# Patient Record
Sex: Female | Born: 1945 | Race: White | Hispanic: No | Marital: Married | Smoking: Former smoker
Health system: Southern US, Community
[De-identification: ages and names within clinical notes are randomized; demographics above are authoritative.]

## PROBLEM LIST (undated history)

## (undated) DIAGNOSIS — I10 Essential (primary) hypertension: Secondary | ICD-10-CM

## (undated) DIAGNOSIS — R519 Headache, unspecified: Secondary | ICD-10-CM

## (undated) DIAGNOSIS — I499 Cardiac arrhythmia, unspecified: Secondary | ICD-10-CM

## (undated) DIAGNOSIS — R112 Nausea with vomiting, unspecified: Secondary | ICD-10-CM

## (undated) DIAGNOSIS — D649 Anemia, unspecified: Secondary | ICD-10-CM

## (undated) DIAGNOSIS — Z9889 Other specified postprocedural states: Secondary | ICD-10-CM

## (undated) DIAGNOSIS — K56609 Unspecified intestinal obstruction, unspecified as to partial versus complete obstruction: Secondary | ICD-10-CM

## (undated) DIAGNOSIS — I82409 Acute embolism and thrombosis of unspecified deep veins of unspecified lower extremity: Secondary | ICD-10-CM

## (undated) DIAGNOSIS — M1990 Unspecified osteoarthritis, unspecified site: Secondary | ICD-10-CM

## (undated) DIAGNOSIS — Z8673 Personal history of transient ischemic attack (TIA), and cerebral infarction without residual deficits: Secondary | ICD-10-CM

## (undated) DIAGNOSIS — K5732 Diverticulitis of large intestine without perforation or abscess without bleeding: Secondary | ICD-10-CM

## (undated) DIAGNOSIS — K219 Gastro-esophageal reflux disease without esophagitis: Secondary | ICD-10-CM

## (undated) DIAGNOSIS — R51 Headache: Secondary | ICD-10-CM

## (undated) DIAGNOSIS — Z87440 Personal history of urinary (tract) infections: Secondary | ICD-10-CM

## (undated) DIAGNOSIS — E0590 Thyrotoxicosis, unspecified without thyrotoxic crisis or storm: Secondary | ICD-10-CM

## (undated) DIAGNOSIS — F419 Anxiety disorder, unspecified: Secondary | ICD-10-CM

## (undated) DIAGNOSIS — I509 Heart failure, unspecified: Secondary | ICD-10-CM

## (undated) DIAGNOSIS — D591 Other autoimmune hemolytic anemias: Secondary | ICD-10-CM

## (undated) DIAGNOSIS — E871 Hypo-osmolality and hyponatremia: Secondary | ICD-10-CM

## (undated) DIAGNOSIS — R011 Cardiac murmur, unspecified: Secondary | ICD-10-CM

## (undated) DIAGNOSIS — K659 Peritonitis, unspecified: Secondary | ICD-10-CM

## (undated) HISTORY — PX: OTHER SURGICAL HISTORY: SHX169

## (undated) HISTORY — PX: COLON SURGERY: SHX602

---

## 1964-07-05 HISTORY — PX: OVARIAN CYST REMOVAL: SHX89

## 1964-07-05 HISTORY — PX: APPENDECTOMY: SHX54

## 1970-07-05 HISTORY — PX: OTHER SURGICAL HISTORY: SHX169

## 1975-07-06 DIAGNOSIS — K659 Peritonitis, unspecified: Secondary | ICD-10-CM

## 1975-07-06 HISTORY — DX: Peritonitis, unspecified: K65.9

## 1992-07-05 DIAGNOSIS — K5732 Diverticulitis of large intestine without perforation or abscess without bleeding: Secondary | ICD-10-CM

## 1992-07-05 HISTORY — DX: Diverticulitis of large intestine without perforation or abscess without bleeding: K57.32

## 2005-02-06 ENCOUNTER — Emergency Department (HOSPITAL_COMMUNITY): Admission: EM | Admit: 2005-02-06 | Discharge: 2005-02-06 | Payer: Self-pay | Admitting: Emergency Medicine

## 2005-03-03 ENCOUNTER — Other Ambulatory Visit: Admission: RE | Admit: 2005-03-03 | Discharge: 2005-03-03 | Payer: Self-pay | Admitting: Obstetrics and Gynecology

## 2009-07-05 HISTORY — PX: LEG SURGERY: SHX1003

## 2013-07-05 HISTORY — PX: VITRECTOMY: SHX106

## 2014-07-05 HISTORY — PX: EYE SURGERY: SHX253

## 2014-08-20 ENCOUNTER — Emergency Department (HOSPITAL_COMMUNITY): Payer: Medicare Other

## 2014-08-20 ENCOUNTER — Emergency Department (HOSPITAL_COMMUNITY)
Admission: EM | Admit: 2014-08-20 | Discharge: 2014-08-21 | Disposition: A | Discharge status: Home / Self Care | Payer: Medicare Other | Attending: Emergency Medicine | Admitting: Emergency Medicine

## 2014-08-20 ENCOUNTER — Encounter (HOSPITAL_COMMUNITY): Payer: Self-pay | Admitting: Adult Health

## 2014-08-20 DIAGNOSIS — I639 Cerebral infarction, unspecified: Secondary | ICD-10-CM

## 2014-08-20 DIAGNOSIS — E871 Hypo-osmolality and hyponatremia: Secondary | ICD-10-CM

## 2014-08-20 DIAGNOSIS — R519 Headache, unspecified: Secondary | ICD-10-CM

## 2014-08-20 DIAGNOSIS — R51 Headache: Secondary | ICD-10-CM | POA: Insufficient documentation

## 2014-08-20 DIAGNOSIS — I10 Essential (primary) hypertension: Secondary | ICD-10-CM | POA: Diagnosis not present

## 2014-08-20 DIAGNOSIS — H532 Diplopia: Secondary | ICD-10-CM | POA: Diagnosis not present

## 2014-08-20 DIAGNOSIS — D6489 Other specified anemias: Secondary | ICD-10-CM

## 2014-08-20 DIAGNOSIS — J018 Other acute sinusitis: Secondary | ICD-10-CM

## 2014-08-20 HISTORY — DX: Essential (primary) hypertension: I10

## 2014-08-20 LAB — COMPREHENSIVE METABOLIC PANEL
ALBUMIN: 3.8 g/dL (ref 3.5–5.2)
ALK PHOS: 91 U/L (ref 39–117)
ALT: 10 U/L (ref 0–35)
AST: 17 U/L (ref 0–37)
Anion gap: 9 (ref 5–15)
BUN: 15 mg/dL (ref 6–23)
CHLORIDE: 99 mmol/L (ref 96–112)
CO2: 22 mmol/L (ref 19–32)
Calcium: 8.9 mg/dL (ref 8.4–10.5)
Creatinine, Ser: 0.85 mg/dL (ref 0.50–1.10)
GFR calc Af Amer: 79 mL/min — ABNORMAL LOW (ref >90)
GFR calc non Af Amer: 68 mL/min — ABNORMAL LOW (ref >90)
Glucose, Bld: 111 mg/dL — ABNORMAL HIGH (ref 70–99)
POTASSIUM: 4.4 mmol/L (ref 3.5–5.1)
Sodium: 130 mmol/L — ABNORMAL LOW (ref 135–145)
TOTAL PROTEIN: 7.2 g/dL (ref 6.0–8.3)
Total Bilirubin: 1.7 mg/dL — ABNORMAL HIGH (ref 0.3–1.2)

## 2014-08-20 LAB — DIFFERENTIAL
BASOS PCT: 1 % (ref 0–1)
Basophils Absolute: 0.1 10*3/uL (ref 0.0–0.1)
EOS PCT: 1 % (ref 0–5)
Eosinophils Absolute: 0.1 10*3/uL (ref 0.0–0.7)
Lymphocytes Relative: 15 % (ref 12–46)
Lymphs Abs: 1 10*3/uL (ref 0.7–4.0)
MONOS PCT: 5 % (ref 3–12)
Monocytes Absolute: 0.3 10*3/uL (ref 0.1–1.0)
NEUTROS PCT: 78 % — AB (ref 43–77)
Neutro Abs: 4.9 10*3/uL (ref 1.7–7.7)

## 2014-08-20 LAB — PROTIME-INR
INR: 1.06 (ref 0.00–1.49)
PROTHROMBIN TIME: 13.9 s (ref 11.6–15.2)

## 2014-08-20 LAB — CBC
HCT: 26.8 % — ABNORMAL LOW (ref 36.0–46.0)
Hemoglobin: 9.5 g/dL — ABNORMAL LOW (ref 12.0–15.0)
MCH: 39.9 pg — ABNORMAL HIGH (ref 26.0–34.0)
MCHC: 35.4 g/dL (ref 30.0–36.0)
MCV: 112.6 fL — ABNORMAL HIGH (ref 78.0–100.0)
PLATELETS: 301 10*3/uL (ref 150–400)
RBC: 2.38 MIL/uL — ABNORMAL LOW (ref 3.87–5.11)
RDW: 16.5 % — ABNORMAL HIGH (ref 11.5–15.5)
WBC: 6.4 10*3/uL (ref 4.0–10.5)

## 2014-08-20 LAB — I-STAT TROPONIN, ED: Troponin i, poc: 0 ng/mL (ref 0.00–0.08)

## 2014-08-20 LAB — APTT: aPTT: 31 seconds (ref 24–37)

## 2014-08-20 MED ORDER — LORAZEPAM 1 MG PO TABS
1.0000 mg | ORAL_TABLET | Freq: Once | ORAL | Status: AC
  Administered 2014-08-20: 1 mg via ORAL
  Filled 2014-08-20 (×2): qty 1

## 2014-08-20 NOTE — ED Notes (Signed)
MD at bedside. To explain need for MRI

## 2014-08-20 NOTE — ED Provider Notes (Addendum)
CSN: [id number]     Arrival date & time 08/20/14  1826 History   First MD Initiated Contact with Patient 08/20/14 2008     Chief Complaint  Patient presents with  . Diplopia  . Headache     (Consider location/radiation/quality/duration/timing/severity/associated sxs/prior Treatment) Patient is a 69 y.o. female presenting with headaches. The history is provided by the patient.  Headache Associated symptoms: congestion and sinus pressure   Associated symptoms: no abdominal pain, no back pain, no eye pain, no fever, no nausea, no neck pain, no neck stiffness, no numbness, no sore throat, no vomiting and no weakness   pt c/o onset double vision 2 days ago. States symptoms constant, and only appear present w both eyes open - pt states when either eye is closed, no double vision. No amaurosis or visual field cut. No eye pain. Pt indicates is currently being treated for a 'sinus infection' with doxycycline and cipro.  Pt notes sinus congestion and drainage. No ear pain, tinnitus or change in hearing. No dizziness. Pt has noted gradual onset intermittent frontal headaches, which she attributed to sinus symptoms. No acute or abrupt onset of headache, or severe headache. No neck pain or stiffness. No numbness or weakness. No problems w speech. No change in gait. No loss of normal functional ability.  Pt saw her ophthalmologist today who sent to ED to 'rule out stroke'.     Past Medical History  Diagnosis Date  . Hypertension    Past Surgical History  Procedure Laterality Date  . Eye surgery    . Vitrectomy     History reviewed. No pertinent family history. History  Substance Use Topics  . Smoking status: Never Smoker   . Smokeless tobacco: Not on file  . Alcohol Use: Yes   OB History    No data available     Review of Systems  Constitutional: Negative for fever and chills.  HENT: Positive for congestion, rhinorrhea and sinus pressure. Negative for sore throat and trouble swallowing.    Eyes: Negative for pain, discharge and redness.  Respiratory: Negative for shortness of breath.   Cardiovascular: Negative for chest pain.  Gastrointestinal: Negative for nausea, vomiting and abdominal pain.  Genitourinary: Negative for dysuria and flank pain.  Musculoskeletal: Negative for back pain, neck pain and neck stiffness.  Skin: Negative for rash.  Neurological: Positive for headaches. Negative for weakness and numbness.  Hematological: Does not bruise/bleed easily.  Psychiatric/Behavioral: Negative for confusion.      Allergies  Codeine; Erythromycin; Morphine and related; Penicillins; Percocet; and Percodan  Home Medications   Prior to Admission medications   Not on File   BP 180/70 mmHg  Pulse 63  Temp(Src) 98.2 F (36.8 C) (Oral)  Resp 16  Ht 5' 3.75" (1.619 m)  Wt 150 lb (68.04 kg)  BMI 25.96 kg/m2  SpO2 100% Physical Exam  Constitutional: She is oriented to person, place, and time. She appears well-developed and well-nourished. No distress.  HENT:  Head: Atraumatic.  Nose: Nose normal.  Mouth/Throat: Oropharynx is clear and moist.  No temporal tenderness. +sinus congestion  Eyes: Conjunctivae and EOM are normal. Pupils are equal, round, and reactive to light. No scleral icterus.  Neck: Neck supple. No tracheal deviation present. No thyromegaly present.  No stiffness or rigidity. No bruits.   Cardiovascular: Normal rate, regular rhythm, normal heart sounds and intact distal pulses.  Exam reveals no gallop and no friction rub.   No murmur heard. Pulmonary/Chest: Effort normal and breath sounds  normal. No respiratory distress.  Abdominal: Soft. Normal appearance and bowel sounds are normal. She exhibits no distension. There is no tenderness.  Genitourinary:  No cva tenderness.  Musculoskeletal: Normal range of motion. She exhibits no edema or tenderness.  Neurological: She is alert and oriented to person, place, and time. No cranial nerve deficit.  Motor  intact bilaterally, stre 5/5. sens grossly intact. No pronator drift. Finger to nose intact/normal. Steady gait. No nystagmus. Pt reports seeing two images, one on top of other with both eyes. Full eye exam documentation w pt from Dr Vonna KotykBevis.  Skin: Skin is warm and dry. No rash noted. She is not diaphoretic.  Psychiatric: She has a normal mood and affect.  Nursing note and vitals reviewed.   ED Course  Procedures (including critical care time) Labs Review   Results for orders placed or performed during the hospital encounter of 08/20/14  Protime-INR  Result Value Ref Range   Prothrombin Time 13.9 11.6 - 15.2 seconds   INR 1.06 0.00 - 1.49  APTT  Result Value Ref Range   aPTT 31 24 - 37 seconds  CBC  Result Value Ref Range   WBC 6.4 4.0 - 10.5 K/uL   RBC 2.38 (L) 3.87 - 5.11 MIL/uL   Hemoglobin 9.5 (L) 12.0 - 15.0 g/dL   HCT 13.026.8 (L) 86.536.0 - 78.446.0 %   MCV 112.6 (H) 78.0 - 100.0 fL   MCH 39.9 (H) 26.0 - 34.0 pg   MCHC 35.4 30.0 - 36.0 g/dL   RDW 69.616.5 (H) 29.511.5 - 28.415.5 %   Platelets 301 150 - 400 K/uL  Differential  Result Value Ref Range   Neutrophils Relative % 78 (H) 43 - 77 %   Lymphocytes Relative 15 12 - 46 %   Monocytes Relative 5 3 - 12 %   Eosinophils Relative 1 0 - 5 %   Basophils Relative 1 0 - 1 %   Neutro Abs 4.9 1.7 - 7.7 K/uL   Lymphs Abs 1.0 0.7 - 4.0 K/uL   Monocytes Absolute 0.3 0.1 - 1.0 K/uL   Eosinophils Absolute 0.1 0.0 - 0.7 K/uL   Basophils Absolute 0.1 0.0 - 0.1 K/uL   RBC Morphology POLYCHROMASIA PRESENT   Comprehensive metabolic panel  Result Value Ref Range   Sodium 130 (L) 135 - 145 mmol/L   Potassium 4.4 3.5 - 5.1 mmol/L   Chloride 99 96 - 112 mmol/L   CO2 22 19 - 32 mmol/L   Glucose, Bld 111 (H) 70 - 99 mg/dL   BUN 15 6 - 23 mg/dL   Creatinine, Ser 1.320.85 0.50 - 1.10 mg/dL   Calcium 8.9 8.4 - 44.010.5 mg/dL   Total Protein 7.2 6.0 - 8.3 g/dL   Albumin 3.8 3.5 - 5.2 g/dL   AST 17 0 - 37 U/L   ALT 10 0 - 35 U/L   Alkaline Phosphatase 91 39 - 117  U/L   Total Bilirubin 1.7 (H) 0.3 - 1.2 mg/dL   GFR calc non Af Amer 68 (L) >90 mL/min   GFR calc Af Amer 79 (L) >90 mL/min   Anion gap 9 5 - 15  I[facility]stat troponin, ED [facility])  Result Value Ref Range   Troponin i, poc 0.00 0.00 - 0.08 ng/mL   Comment 3           Ct Head Wo Contrast  08/20/2014   CLINICAL DATA:  Double vision and frontal headaches for 3 days  EXAM: CT  HEAD WITHOUT CONTRAST  TECHNIQUE: Contiguous axial images were obtained from the base of the skull through the vertex without intravenous contrast.  COMPARISON:  None.  FINDINGS: The bony calvarium is intact. The ethmoid and maxillary sinuses demonstrate opacification consistent with chronic sinus disease. No findings to suggest acute hemorrhage, acute infarction or space-occupying mass lesion are noted.  IMPRESSION: Chronic opacification in the ethmoid and maxillary sinuses. This may be related to the patient's current symptomatology.  No acute intracranial abnormality is noted.   Electronically Signed   By: Alcide Clever M.D.   On: 08/20/2014 21:23   Mr Brain Wo Contrast  08/20/2014   CLINICAL DATA:  Initial evaluation for headache and diplopia.  EXAM: MRI HEAD WITHOUT CONTRAST  TECHNIQUE: Multiplanar, multiecho pulse sequences of the brain and surrounding structures were obtained without intravenous contrast.  COMPARISON:  Prior CT from earlier the same day.  FINDINGS: Mild diffuse prominence of the CSF containing spaces is compatible with generalized cerebral atrophy. Patchy T2/FLAIR hyperintensity within the periventricular deep white matter both cerebral hemispheres noted, most compatible with mild to moderate chronic small vessel ischemic disease. Small remote lacunar infarct present within the right cerebellar hemisphere. Probable small additional remote lacunar infarct within the right caudate head.  No abnormal foci of restricted diffusion to suggest acute intracranial infarct. Gray-white matter differentiation maintained.  Normal intravascular flow voids are present. No intracranial hemorrhage.  No mass lesion or midline shift. No hydrocephalus. No extra-axial fluid collection.  Craniocervical junction within normal limits. Mild thickening of the tectorial membrane noted. Pituitary gland normal. No acute abnormality seen about the orbits.  There is essentially complete opacification of the bilateral maxillary sinuses, frontal sinuses, and sphenoid sinuses, worse on the left. Ethmoidal air cells are largely opacified as well. These changes appear largely chronic on prior CT.  Scattered fluid signal intensity present within the inferior left mastoid air cells. Right mastoid air cells clear.  IMPRESSION: 1. No acute intracranial infarct or other abnormality identified. 2. Pansinus disease, better evaluated on prior CT from earlier the same day. This could conceivably be related to patient's current symptomatology. 3. 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 4. 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   Electronically Signed   By: Rise Mu M.D.   On: 08/20/2014 23:49       EKG Interpretation   Date/Time:  Tuesday August 20 2014 18:37:26 EST Ventricular Rate:  62 PR Interval:  162 QRS Duration: 74 QT Interval:  392 QTC Calculation: 397 R Axis:   42 Text Interpretation:  Normal sinus rhythm Normal ECG No previous tracing  Confirmed by Denton Lank  MD, Caryn Bee ([id number]) on 08/20/2014 8:18:16 PM      MDM   Ct. Lab.  Reviewed nursing notes and prior charts for additional history.   Pt sent from ophthalmologists office w request for mr.  Mri read by radiology neg for acute cva.  Note made on ct and mr of sinusitis.   Pt on recheck comfortable. Afeb. States still has abx left to take.  Currently on no antihis-dec except had tried mucinex.  Will give rx zyrtec d.  Discussed close pcp follow up - including for mild hyponatremia and  anemia (no prior labs in system to compare).  Return precautions discussed.       Suzi Roots, MD 08/21/14 [phone]

## 2014-08-20 NOTE — ED Notes (Signed)
Pt transported to MRI 

## 2014-08-20 NOTE — ED Notes (Signed)
Sent from [facility] with c/o verticle diplopia for 2 days began while watching tv. Endorses headache and double vision at this time. Nothing makes vision better, nothing makes it worse. Headache and double vision comes and goes-this episode began at 2 pm today. Eye doctor sent her here to rule out a CVA. No drift, droop or numbness or tingling.

## 2014-08-20 NOTE — ED Notes (Signed)
Pt still in MRI at this time

## 2014-08-20 NOTE — ED Notes (Signed)
CT made aware pt will have CT not MRI per dr. Denton Lanksteinl

## 2014-08-20 NOTE — ED Notes (Signed)
MD at bedside. 

## 2014-08-21 MED ORDER — CETIRIZINE-PSEUDOEPHEDRINE ER 5-120 MG PO TB12: 1.0000 | ORAL_TABLET | Freq: Two times a day (BID) | ORAL | Status: DC | PRN

## 2014-08-21 NOTE — Discharge Instructions (Signed)
It was our pleasure to provide your ER care today - we hope that you feel better.  Rest. Drink adequate fluids, and eat balanced diet.   Complete the course of your antibiotic.  Take zyrtec d as need for sinus congestion.  Follow up with your doctor in the next 1-2 days for recheck.  From today's lab tests, your sodium level is mildly low (130), and your blood count low (hemoglobin 9.5) - discuss these results with your doctor, and have them recheck labs, and follow up for this.  Return to ER if worse, new symptoms, severe headache, persistent vomiting, severe diarrhea, weak/faint, change in vision, numbness/weakness, other concern.      Sinusitis Sinusitis is redness, soreness, and inflammation of the paranasal sinuses. Paranasal sinuses are air pockets within the bones of your face (beneath the eyes, the middle of the forehead, or above the eyes). In healthy paranasal sinuses, mucus is able to drain out, and air is able to circulate through them by way of your nose. However, when your paranasal sinuses are inflamed, mucus and air can become trapped. This can allow bacteria and other germs to grow and cause infection. Sinusitis can develop quickly and last only a short time (acute) or continue over a long period (chronic). Sinusitis that lasts for more than 12 weeks is considered chronic.  CAUSES  Causes of sinusitis include:  Allergies.  Structural abnormalities, such as displacement of the cartilage that separates your nostrils (deviated septum), which can decrease the air flow through your nose and sinuses and affect sinus drainage.  Functional abnormalities, such as when the small hairs (cilia) that line your sinuses and help remove mucus do not work properly or are not present. SIGNS AND SYMPTOMS  Symptoms of acute and chronic sinusitis are the same. The primary symptoms are pain and pressure around the affected sinuses. Other symptoms include:  Upper  toothache.  Earache.  Headache.  Bad breath.  Decreased sense of smell and taste.  A cough, which worsens when you are lying flat.  Fatigue.  Fever.  Thick drainage from your nose, which often is green and may contain pus (purulent).  Swelling and warmth over the affected sinuses. DIAGNOSIS  Your health care provider will perform a physical exam. During the exam, your health care provider may:  Look in your nose for signs of abnormal growths in your nostrils (nasal polyps).  Tap over the affected sinus to check for signs of infection.  View the inside of your sinuses (endoscopy) using an imaging device that has a light attached (endoscope). If your health care provider suspects that you have chronic sinusitis, one or more of the following tests may be recommended:  Allergy tests.  Nasal culture. A sample of mucus is taken from your nose, sent to a lab, and screened for bacteria.  Nasal cytology. A sample of mucus is taken from your nose and examined by your health care provider to determine if your sinusitis is related to an allergy. TREATMENT  Most cases of acute sinusitis are related to a viral infection and will resolve on their own within 10 days. Sometimes medicines are prescribed to help relieve symptoms (pain medicine, decongestants, nasal steroid sprays, or saline sprays).  However, for sinusitis related to a bacterial infection, your health care provider will prescribe antibiotic medicines. These are medicines that will help kill the bacteria causing the infection.  Rarely, sinusitis is caused by a fungal infection. In theses cases, your health care provider will prescribe antifungal  medicine. For some cases of chronic sinusitis, surgery is needed. Generally, these are cases in which sinusitis recurs more than 3 times per year, despite other treatments. HOME CARE INSTRUCTIONS   Drink plenty of water. Water helps thin the mucus so your sinuses can drain more  easily.  Use a humidifier.  Inhale steam 3 to 4 times a day (for example, sit in the bathroom with the shower running).  Apply a warm, moist washcloth to your face 3 to 4 times a day, or as directed by your health care provider.  Use saline nasal sprays to help moisten and clean your sinuses.  Take medicines only as directed by your health care provider.  If you were prescribed either an antibiotic or antifungal medicine, finish it all even if you start to feel better. SEEK IMMEDIATE MEDICAL CARE IF:  You have increasing pain or severe headaches.  You have nausea, vomiting, or drowsiness.  You have swelling around your face.  You have vision problems.  You have a stiff neck.  You have difficulty breathing. MAKE SURE YOU:   Understand these instructions.  Will watch your condition.  Will get help right away if you are not doing well or get worse. Document Released: 06/21/2005 Document Revised: 11/05/2013 Document Reviewed: 07/06/2011 [facility] Patient Information 2015 [place], [place]. This information is not intended to replace advice given to you by your health care provider. Make sure you discuss any questions you have with your health care provider.    Diplopia  Double vision (diplopia) means that you are seeing two of everything. Diplopia usually occurs with both eyes open, and may be worse when looking in one particular direction. If both eyes must be open to see double, this is called binocular diplopia. If double images are seen in just one eye, this is called monocular diplopia.  CAUSES  Binocular Diplopia  Disorder affecting the muscles that move the eyes or the nerves that control those muscles.  Tumor or other mass pushing on an eye from beside or behind the eye.  Myasthenia gravis. This is a neuromuscular illness that causes the body's muscles to tire easily. The eye muscles and eyelid muscles become weak. The eyes do not track together well.  Grave's  disease. This is an overactivity of the thyroid gland. This condition causes swelling of tissues around the eyes. This produces a bulging out of the eyeball.  Blowout fracture of the bone around the eye. The muscles of the eye socket are damaged. This often happens when the eye is hit with force.  Complications after certain surgeries, such as a lens implant after cataract surgery.  Fluid-filled mass (abscess) behind or beside the eye, infection, or abnormal connection between arteries and veins. Sometimes, no cause is found. Monocular Diplopia  Problems with corrective lens or contacts.  Corneal abrasion, infection, severe injury, or bulging and irregularity of the corneal surface (keratoconus).  Irregularities of the pupil from drugs, severe injury, or other causes.  Problems involving the lens of the eye, such as opacities or cataracts.  Complications after certain surgeries, such as a lens implant after cataract surgery.  Retinal detachment or problems involving the blood vessels of the retina. Sometimes, no cause is found. SYMPTOMS  Binocular Diplopia  When one eye is closed or covered, the double images disappear. Monocular Diplopia  When the unaffected eye is closed or covered, the double images remain. The double images disappear when the affected eye is closed or covered. DIAGNOSIS  A diagnosis is  made during an eye exam. TREATMENT  Treatment depends on the cause or underlying disease.   Relief of double vision symptoms may be achieved by patching one eye or by using special glasses.  Surgery on the muscles of the eye may be needed. SEEK IMMEDIATE MEDICAL CARE IF:   You see two images of a single object you are looking at, either with both eyes open or with just one eye open. Document Released: 04/22/2004 Document Revised: 09/13/2011 Document Reviewed: 02/07/2008 [facility] Patient Information 2015 [place]. This information is not intended to replace advice  given to you by your health care provider. Make sure you discuss any questions you have with your health care provider.

## 2014-12-10 ENCOUNTER — Other Ambulatory Visit (HOSPITAL_COMMUNITY): Payer: Self-pay | Admitting: Otolaryngology

## 2014-12-18 ENCOUNTER — Encounter (HOSPITAL_COMMUNITY): Payer: Self-pay

## 2014-12-18 ENCOUNTER — Encounter (HOSPITAL_COMMUNITY)
Admission: RE | Admit: 2014-12-18 | Discharge: 2014-12-18 | Disposition: A | Discharge status: Home / Self Care | Payer: Medicare Other | Source: Ambulatory Visit | Attending: Otolaryngology | Admitting: Otolaryngology

## 2014-12-18 DIAGNOSIS — Z8673 Personal history of transient ischemic attack (TIA), and cerebral infarction without residual deficits: Secondary | ICD-10-CM | POA: Diagnosis not present

## 2014-12-18 DIAGNOSIS — J3489 Other specified disorders of nose and nasal sinuses: Secondary | ICD-10-CM | POA: Diagnosis not present

## 2014-12-18 DIAGNOSIS — Z87891 Personal history of nicotine dependence: Secondary | ICD-10-CM | POA: Diagnosis not present

## 2014-12-18 DIAGNOSIS — I10 Essential (primary) hypertension: Secondary | ICD-10-CM | POA: Diagnosis not present

## 2014-12-18 DIAGNOSIS — J321 Chronic frontal sinusitis: Secondary | ICD-10-CM | POA: Diagnosis present

## 2014-12-18 HISTORY — DX: Thyrotoxicosis, unspecified without thyrotoxic crisis or storm: E05.90

## 2014-12-18 HISTORY — DX: Cardiac arrhythmia, unspecified: I49.9

## 2014-12-18 HISTORY — DX: Headache: R51

## 2014-12-18 HISTORY — DX: Personal history of transient ischemic attack (TIA), and cerebral infarction without residual deficits: Z86.73

## 2014-12-18 HISTORY — DX: Headache, unspecified: R51.9

## 2014-12-18 HISTORY — DX: Other specified postprocedural states: Z98.890

## 2014-12-18 HISTORY — DX: Unspecified osteoarthritis, unspecified site: M19.90

## 2014-12-18 HISTORY — DX: Peritonitis, unspecified: K65.9

## 2014-12-18 HISTORY — DX: Personal history of urinary (tract) infections: Z87.440

## 2014-12-18 HISTORY — DX: Nausea with vomiting, unspecified: R11.2

## 2014-12-18 HISTORY — DX: Gastro-esophageal reflux disease without esophagitis: K21.9

## 2014-12-18 HISTORY — DX: Diverticulitis of large intestine without perforation or abscess without bleeding: K57.32

## 2014-12-18 HISTORY — DX: Anemia, unspecified: D64.9

## 2014-12-18 LAB — BASIC METABOLIC PANEL
Anion gap: 8 (ref 5–15)
BUN: 23 mg/dL — ABNORMAL HIGH (ref 6–20)
CO2: 24 mmol/L (ref 22–32)
Calcium: 9.3 mg/dL (ref 8.9–10.3)
Chloride: 100 mmol/L — ABNORMAL LOW (ref 101–111)
Creatinine, Ser: 0.96 mg/dL (ref 0.44–1.00)
GFR, EST NON AFRICAN AMERICAN: 59 mL/min — AB (ref >60)
Glucose, Bld: 87 mg/dL (ref 65–99)
POTASSIUM: 3.7 mmol/L (ref 3.5–5.1)
Sodium: 132 mmol/L — ABNORMAL LOW (ref 135–145)

## 2014-12-18 LAB — CBC
HCT: 33 % — ABNORMAL LOW (ref 36.0–46.0)
Hemoglobin: 11.7 g/dL — ABNORMAL LOW (ref 12.0–15.0)
MCH: 39.5 pg — AB (ref 26.0–34.0)
MCHC: 35.5 g/dL (ref 30.0–36.0)
MCV: 111.5 fL — AB (ref 78.0–100.0)
Platelets: 236 10*3/uL (ref 150–400)
RBC: 2.96 MIL/uL — ABNORMAL LOW (ref 3.87–5.11)
RDW: 15.1 % (ref 11.5–15.5)
WBC: 7.7 10*3/uL (ref 4.0–10.5)

## 2014-12-18 NOTE — Progress Notes (Signed)
   12/18/14 1336  OBSTRUCTIVE SLEEP APNEA  Have you ever been diagnosed with sleep apnea through a sleep study? No  Do you snore loudly (loud enough to be heard through closed doors)?  0  Do you often feel tired, fatigued, or sleepy during the daytime? 1  Has anyone observed you stop breathing during your sleep? 0  Do you have, or are you being treated for high blood pressure? 1  BMI more than 35 kg/m2? 0  Age over 70 years old? 1  Neck circumference greater than 40 cm/16 inches? 1 (17)  Gender: 0  Obstructive Sleep Apnea Score 4

## 2014-12-18 NOTE — Pre-Procedure Instructions (Signed)
Beverly Lawrence 12/18/2014      [id number] - [place], [place] - [address] [place] [address] Phone: [phone] Fax: [phone]    Your procedure is scheduled on Friday December 20, 2014 at 12:30 PM  Report to [facility] Admitting at 10:30 A.M.  Call this number if you have problems the morning of surgery:  [phone]   Remember:  Do not eat food or drink liquids after midnight.  Take these medicines the morning of surgery with A SIP OF WATER metoprolol tartrate (Lopressor), flonase, pantoprazole (Protonix), prednisone.  Do not take any aspirin, NSAIDS (advil, aleve, naproxen, ibuprofen), vitamins, supplements, fish oil and/or herbal medications five days prior to surgery.   Do not wear jewelry, make-up or nail polish.  Do not wear lotions, powders, or perfumes.   Do not shave 48 hours prior to surgery.    Do not bring valuables to the hospital.  [facility] is not responsible for any belongings or valuables.  Contacts, dentures or bridgework may not be worn into surgery.  Leave your suitcase in the car.  After surgery it may be brought to your room.  For patients admitted to the hospital, discharge time will be determined by your treatment team.  Patients discharged the day of surgery will not be allowed to drive home.   See instructions for showering prior to surgery.  Please read over the following fact sheets that you were given. Pain Booklet, Coughing and Deep Breathing and Surgical Site Infection Prevention

## 2014-12-18 NOTE — Progress Notes (Signed)
Patient seen today for pre-surgical visit.  Patient denies seeing a cardiologist.  Patient states that she has had a stress test more than 7 years ago, an echo more than 10 years ago, and a heart catheterization more than 30 years ago.  Patient denies any current chest pain or cardiac symptoms.  Patient states that she is currently seeing a hematologist for her anemia.

## 2014-12-19 NOTE — H&P (Signed)
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  She now presents for balloon sinuplasty.  Dr. Emeline Darling, [place] Riley has discussed the risks (bleeding, infection, etc.), benefits, and alternatives of this procedure. The patient understands the risks and would like to proceed with the procedure. The chances of success of the procedure are >50% and the patient understands this. I personally performed an examination of the patient within 24 hours of the procedure.  PAST MEDICAL HISTORY: Past Medical History  Diagnosis Date  . Hypertension   . PONV (postoperative nausea and vomiting)   . Diverticulitis of colon 1994    with colostomy placed and later reversed  . Peritonitis 1977    hx of  . Dysrhythmia     patient states that she has a "premature beat"  . 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 Hyperthyroidism   . Hx: UTI (urinary tract infection)   . GERD (gastroesophageal reflux disease)   . Headache   . Arthritis     currently being seen by rheumatologist for confirmed diagnosing  . Anemia      PAST SURGICAL HISTORY: Past Surgical History  Procedure Laterality Date  . Vitrectomy Left 2015  . Appendectomy  1966  . Ovarian cyst removal Left 1966  . Colon surgery      colostomy placed in 1994 and reversed in 1995  . Pilonidal cyst  1972  . Leg surgery Bilateral 2011    vein ablation in both legs per patient  . Eye surgery Bilateral 2016    cataracts    MEDICATIONS: No current facility-administered medications on file prior to encounter.   Current Outpatient Prescriptions on File Prior to Encounter  Medication Sig Dispense Refill  . metoprolol tartrate (LOPRESSOR) 25 MG tablet Take 25 mg by mouth 2 (two) times daily  with a meal.     . cetirizine-pseudoephedrine (ZYRTEC-D) 5-120 MG per tablet Take 1 tablet by mouth 2 (two) times daily as needed for allergies or rhinitis. (Patient not taking: Reported on 12/18/2014) 20 tablet 0    ALLERGIES: Allergies  Allergen Reactions  . Codeine Other (See Comments)    unknown  . Erythromycin Other (See Comments)    unknown  . Morphine And Related Other (See Comments)    unknown  . Penicillins Other (See Comments)    unknown  . Percocet [Oxycodone-Acetaminophen] Other (See Comments)    unknown  . Percodan [Oxycodone-Aspirin] Other (See Comments)    unknown      SOCIAL HISTORY: History   Social History  . Marital Status: Married    Spouse Name: N/A  . Number of Children: N/A  . Years of Education: N/A   Occupational History  . Not on file.   Social History Main Topics  . Smoking status: Former Smoker    Quit date: 07/05/2004  . Smokeless tobacco: Not on file  . Alcohol Use: 1.2 oz/week    2 Glasses of wine per week  . Drug Use: No  . Sexual Activity: Not on file   Other Topics Concern  . Not on file   Social History Narrative    FAMILY HISTORY: No family history on file.  REVIEW OF SYSTEMS:  HEENT: sinusitis, nasal congestion, otherwise negative x 12 systems except  Per  HPI  PHYSICAL EXAM:  GENERAL:  NAD VITAL SIGNS:   Filed Vitals:   12/20/14 1043  BP: 189/82  Pulse: 60  Temp: 98.5 F (36.9 C)  Resp: 20   SKIN:  Warm, dry HEENT:  Large septal perforation  NECK:  Trachea midline ABDOMEN:  soft MUSCULOSKELETAL: normal strength PSYCH:  Normal affect NEUROLOGIC:  CN 2-12 intact and symmetric  DIAGNOSTIC STUDIES: CT scan shows pansinusitis and pre-existing septal perforation  ASSESSMENT AND PLAN: Plan to proceed with balloon sinuplasty. Patient understands the risks, benefits, and alternatives. Informed written consent signed, witnessed, and on chart 12/20/14  12:13 PM Beverly Lawrence

## 2014-12-20 ENCOUNTER — Ambulatory Visit (HOSPITAL_COMMUNITY)
Admission: RE | Admit: 2014-12-20 | Discharge: 2014-12-20 | Disposition: A | Discharge status: Home / Self Care | Payer: Medicare Other | Source: Ambulatory Visit | Attending: Otolaryngology | Admitting: Otolaryngology

## 2014-12-20 ENCOUNTER — Encounter (HOSPITAL_COMMUNITY)
Admission: RE | Disposition: A | Discharge status: Home / Self Care | Payer: Self-pay | Source: Ambulatory Visit | Attending: Otolaryngology

## 2014-12-20 ENCOUNTER — Ambulatory Visit (HOSPITAL_COMMUNITY): Payer: Medicare Other | Admitting: Anesthesiology

## 2014-12-20 ENCOUNTER — Encounter (HOSPITAL_COMMUNITY): Payer: Self-pay | Admitting: Anesthesiology

## 2014-12-20 DIAGNOSIS — I10 Essential (primary) hypertension: Secondary | ICD-10-CM | POA: Diagnosis not present

## 2014-12-20 DIAGNOSIS — Z87891 Personal history of nicotine dependence: Secondary | ICD-10-CM | POA: Insufficient documentation

## 2014-12-20 DIAGNOSIS — J321 Chronic frontal sinusitis: Secondary | ICD-10-CM | POA: Diagnosis not present

## 2014-12-20 DIAGNOSIS — J3489 Other specified disorders of nose and nasal sinuses: Secondary | ICD-10-CM | POA: Diagnosis not present

## 2014-12-20 DIAGNOSIS — Z8673 Personal history of transient ischemic attack (TIA), and cerebral infarction without residual deficits: Secondary | ICD-10-CM | POA: Insufficient documentation

## 2014-12-20 HISTORY — PX: SINUS ENDO W/FUSION: SHX777

## 2014-12-20 SURGERY — SINUS SURGERY, ENDOSCOPIC, USING COMPUTER-ASSISTED NAVIGATION
Anesthesia: General | Laterality: Bilateral

## 2014-12-20 MED ORDER — MIDAZOLAM HCL 2 MG/2ML IJ SOLN
INTRAMUSCULAR | Status: AC
  Filled 2014-12-20: qty 2

## 2014-12-20 MED ORDER — PROPOFOL 10 MG/ML IV BOLUS
INTRAVENOUS | Status: AC
  Filled 2014-12-20: qty 20

## 2014-12-20 MED ORDER — LACTATED RINGERS IV SOLN
INTRAVENOUS | Status: DC
  Administered 2014-12-20: 10 mL/h via INTRAVENOUS

## 2014-12-20 MED ORDER — OXYMETAZOLINE HCL 0.05 % NA SOLN
NASAL | Status: AC
  Filled 2014-12-20: qty 15

## 2014-12-20 MED ORDER — LIDOCAINE-EPINEPHRINE 1 %-1:100000 IJ SOLN
INTRAMUSCULAR | Status: DC | PRN
  Administered 2014-12-20: 12.5 mL

## 2014-12-20 MED ORDER — FENTANYL CITRATE (PF) 100 MCG/2ML IJ SOLN
INTRAMUSCULAR | Status: AC
  Filled 2014-12-20: qty 2

## 2014-12-20 MED ORDER — MIDAZOLAM HCL 5 MG/5ML IJ SOLN
INTRAMUSCULAR | Status: DC | PRN
  Administered 2014-12-20: 1 mg via INTRAVENOUS

## 2014-12-20 MED ORDER — LACTATED RINGERS IV SOLN
INTRAVENOUS | Status: DC | PRN
  Administered 2014-12-20 (×2): via INTRAVENOUS

## 2014-12-20 MED ORDER — ONDANSETRON HCL 4 MG/2ML IJ SOLN
INTRAMUSCULAR | Status: DC | PRN
  Administered 2014-12-20: 4 mg via INTRAVENOUS

## 2014-12-20 MED ORDER — PHENYLEPHRINE HCL 10 MG/ML IJ SOLN
INTRAMUSCULAR | Status: DC | PRN
  Administered 2014-12-20 (×5): 80 ug via INTRAVENOUS

## 2014-12-20 MED ORDER — ROCURONIUM BROMIDE 100 MG/10ML IV SOLN
INTRAVENOUS | Status: DC | PRN
  Administered 2014-12-20: 10 mg via INTRAVENOUS
  Administered 2014-12-20: 40 mg via INTRAVENOUS

## 2014-12-20 MED ORDER — OXYMETAZOLINE HCL 0.05 % NA SOLN
NASAL | Status: DC | PRN
  Administered 2014-12-20: 1 via TOPICAL

## 2014-12-20 MED ORDER — FENTANYL CITRATE (PF) 100 MCG/2ML IJ SOLN
INTRAMUSCULAR | Status: DC | PRN
  Administered 2014-12-20 (×2): 100 ug via INTRAVENOUS

## 2014-12-20 MED ORDER — LIDOCAINE-EPINEPHRINE 1 %-1:100000 IJ SOLN
INTRAMUSCULAR | Status: AC
  Filled 2014-12-20: qty 1

## 2014-12-20 MED ORDER — GLYCOPYRROLATE 0.2 MG/ML IJ SOLN
INTRAMUSCULAR | Status: DC | PRN
  Administered 2014-12-20: .7 mg via INTRAVENOUS
  Administered 2014-12-20: 0.2 mg via INTRAVENOUS

## 2014-12-20 MED ORDER — 0.9 % SODIUM CHLORIDE (POUR BTL) OPTIME
TOPICAL | Status: DC | PRN
  Administered 2014-12-20: 1000 mL

## 2014-12-20 MED ORDER — PROPOFOL 10 MG/ML IV BOLUS
INTRAVENOUS | Status: DC | PRN
  Administered 2014-12-20: 150 mg via INTRAVENOUS

## 2014-12-20 MED ORDER — FENTANYL CITRATE (PF) 100 MCG/2ML IJ SOLN
25.0000 ug | INTRAMUSCULAR | Status: DC | PRN
  Administered 2014-12-20 (×4): 25 ug via INTRAVENOUS

## 2014-12-20 MED ORDER — ALBUMIN HUMAN 5 % IV SOLN
INTRAVENOUS | Status: DC | PRN
  Administered 2014-12-20: 14:00:00 via INTRAVENOUS

## 2014-12-20 MED ORDER — MUPIROCIN 2 % EX OINT
TOPICAL_OINTMENT | CUTANEOUS | Status: AC
  Filled 2014-12-20: qty 22

## 2014-12-20 MED ORDER — EPHEDRINE SULFATE 50 MG/ML IJ SOLN
INTRAMUSCULAR | Status: DC | PRN
  Administered 2014-12-20: 10 mg via INTRAVENOUS

## 2014-12-20 MED ORDER — HEMOSTATIC AGENTS (NO CHARGE) OPTIME
TOPICAL | Status: DC | PRN
  Administered 2014-12-20: 1 via TOPICAL

## 2014-12-20 MED ORDER — ONDANSETRON HCL 4 MG/2ML IJ SOLN: 4.0000 mg | Freq: Once | INTRAMUSCULAR | Status: DC | PRN

## 2014-12-20 MED ORDER — HYDROCORTISONE NA SUCCINATE PF 100 MG IJ SOLR
INTRAMUSCULAR | Status: DC | PRN
  Administered 2014-12-20: 100 mg via INTRAVENOUS

## 2014-12-20 MED ORDER — FENTANYL CITRATE (PF) 250 MCG/5ML IJ SOLN
INTRAMUSCULAR | Status: AC
  Filled 2014-12-20: qty 5

## 2014-12-20 MED ORDER — NEOSTIGMINE METHYLSULFATE 10 MG/10ML IV SOLN
INTRAVENOUS | Status: DC | PRN
  Administered 2014-12-20: 4 mg via INTRAVENOUS

## 2014-12-20 SURGICAL SUPPLY — 70 items
APL SKNCLS STERI-STRIP NONHPOA (GAUZE/BANDAGES/DRESSINGS) ×1
ATTRACTOMAT 16X20 MAGNETIC DRP (DRAPES) IMPLANT
BENZOIN TINCTURE PRP APPL 2/3 (GAUZE/BANDAGES/DRESSINGS) ×3 IMPLANT
BLADE RAD 40 CVD SINUS 4MM (BLADE) IMPLANT
BLADE RAD60 ROTATE M4 4 5PK (BLADE) IMPLANT
BLADE RAD60 ROTATE M4 4MM 5PK (BLADE)
BLADE ROTATE RAD 40 4 M4 (BLADE) IMPLANT
BLADE ROTATE RAD 40 4MM M4 (BLADE)
BLADE ROTATE TRICUT 4MX13CM M4 (BLADE)
BLADE ROTATE TRICUT 4X13 M4 (BLADE) ×1 IMPLANT
BLADE SURG 15 STRL LF DISP TIS (BLADE) IMPLANT
BLADE SURG 15 STRL SS (BLADE)
BUR DIAMOND 13CMX5MM 70DEG (BURR)
BUR DIAMOND 13X5 70D (BURR) IMPLANT
BUR DIAMOND 15CMX5MM 15SINUS (BURR)
BUR DIAMOND CURV 15X5 15D (BURR) IMPLANT
CANISTER SUCTION 2500CC (MISCELLANEOUS) ×3 IMPLANT
CLOSURE WOUND 1/2 X4 (GAUZE/BANDAGES/DRESSINGS) ×1
COAGULATOR SUCT 6 FR SWTCH (ELECTROSURGICAL) ×1
COAGULATOR SUCT SWTCH 10FR 6 (ELECTROSURGICAL) ×2 IMPLANT
CONT SPEC 4OZ CLIKSEAL STRL BL (MISCELLANEOUS) IMPLANT
CORDS BIPOLAR (ELECTRODE) IMPLANT
CRADLE DONUT ADULT HEAD (MISCELLANEOUS) ×2 IMPLANT
DEVICE INFLATION SEID (MISCELLANEOUS) ×2 IMPLANT
DRAPE PROXIMA HALF (DRAPES) IMPLANT
DRESSING NASAL POPE 10X1.5X2.5 (GAUZE/BANDAGES/DRESSINGS) IMPLANT
DRSG NASAL POPE 10X1.5X2.5 (GAUZE/BANDAGES/DRESSINGS)
DRSG NASOPORE 8CM (GAUZE/BANDAGES/DRESSINGS) IMPLANT
DURASEAL SPINE SEALANT 3ML (MISCELLANEOUS) IMPLANT
ELECT REM PT RETURN 9FT ADLT (ELECTROSURGICAL) ×3
ELECTRODE REM PT RTRN 9FT ADLT (ELECTROSURGICAL) IMPLANT
FILTER ARTHROSCOPY CONVERTOR (FILTER) ×4 IMPLANT
FLOSEAL 10ML (HEMOSTASIS) IMPLANT
GLOVE BIO SURGEON STRL SZ7 (GLOVE) ×2 IMPLANT
GLOVE BIOGEL PI IND STRL 7.0 (GLOVE) IMPLANT
GLOVE BIOGEL PI INDICATOR 7.0 (GLOVE) ×2
GLOVE SURG SS PI 7.0 STRL IVOR (GLOVE) ×2 IMPLANT
GLOVE SURG SS PI 7.5 STRL IVOR (GLOVE) ×3 IMPLANT
GOWN STRL REUS W/ TWL LRG LVL3 (GOWN DISPOSABLE) ×2 IMPLANT
GOWN STRL REUS W/TWL LRG LVL3 (GOWN DISPOSABLE) ×6
KIT BASIN OR (CUSTOM PROCEDURE TRAY) ×3 IMPLANT
KIT ROOM TURNOVER OR (KITS) ×3 IMPLANT
NDL HYPO 25GX1X1/2 BEV (NEEDLE) IMPLANT
NDL SPNL 20GX3.5 QUINCKE YW (NEEDLE) ×1 IMPLANT
NEEDLE HYPO 25GX1X1/2 BEV (NEEDLE) ×3 IMPLANT
NEEDLE SPNL 20GX3.5 QUINCKE YW (NEEDLE) ×3 IMPLANT
NS IRRIG 1000ML POUR BTL (IV SOLUTION) ×3 IMPLANT
PAD ARMBOARD 7.5X6 YLW CONV (MISCELLANEOUS) ×4 IMPLANT
PAD ENT ADHESIVE 25PK (MISCELLANEOUS) ×3 IMPLANT
PATTIES SURGICAL .5 X3 (DISPOSABLE) ×3 IMPLANT
RELIEVA BALLOON SINUPLASTY SYSTEM ×2 IMPLANT
SHEATH ENDOSCRUB 0 DEG (SHEATH) ×1 IMPLANT
SHEATH ENDOSCRUB 30 DEG (SHEATH) IMPLANT
SHEATH ENDOSCRUB 45 DEG (SHEATH) ×1 IMPLANT
SOLUTION ANTI FOG 6CC (MISCELLANEOUS) ×3 IMPLANT
STRIP CLOSURE SKIN 1/2X4 (GAUZE/BANDAGES/DRESSINGS) ×2 IMPLANT
SUT ETHILON 3 0 PS 1 (SUTURE) IMPLANT
SUT SILK 2 0 SH (SUTURE) ×2 IMPLANT
SWAB COLLECTION DEVICE MRSA (MISCELLANEOUS) ×2 IMPLANT
SYR 50ML SLIP (SYRINGE) IMPLANT
SYRINGE 10CC LL (SYRINGE) ×3 IMPLANT
TOWEL OR 17X24 6PK STRL BLUE (TOWEL DISPOSABLE) ×3 IMPLANT
TRACKER ENT INSTRUMENT (MISCELLANEOUS) ×3 IMPLANT
TRACKER ENT PATIENT (MISCELLANEOUS) ×3 IMPLANT
TRAY ENT MC OR (CUSTOM PROCEDURE TRAY) ×3 IMPLANT
TUBE ANAEROBIC SPECIMEN COL (MISCELLANEOUS) ×2 IMPLANT
TUBE CONNECTING 12'X1/4 (SUCTIONS) ×1
TUBE CONNECTING 12X1/4 (SUCTIONS) ×2 IMPLANT
TUBING STRAIGHTSHOT EPS 5PK (TUBING) ×3 IMPLANT
WIPE INSTRUMENT VISIWIPE 73X73 (MISCELLANEOUS) ×3 IMPLANT

## 2014-12-20 NOTE — Op Note (Signed)
2:57 PM  12/20/2014 Surgeon: Melvenia Beam  Procedures Performed: [phone] image guidance [phone] bilateral maxillary sinus balloon dilation 34917-91 bilateral frontal sinus balloon dilation 31297-50 bilateral sphenoid sinus balloon dilation  Operative Findings: copious purulence in the L > R maxillary and frontal sinuses, large pre-existing septal perforation, chronic sinusitis with chronic thickened sinus bone, dilated bilateral maxillary, frontal, and sphenoid sinuses.  Specimens: right and left sinus contents, left maxillary sinus culture.  PREOPERATIVE DIAGNOSIS: Bilateral chronic maxillary, ethmoid, sphenoid, and frontal sinusitis, septal perforation  POSTOPERATIVE DIAGNOSIS: Bilateral chronic maxillary, ethmoid, sphenoid, and frontal sinusitis, septal perforation   ANESTHESIA: General endotracheal.  ESTIMATED BLOOD LOSS: Approximately 250 mL.  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  PROCEDURE: The patient was brought to the operating room and placed in the supine position. After adequate endotracheal anesthesia was obtained, the skin was draped in sterile fashion. Lidocaine 1% with 1:100,000 epinephrine was injected into the bilateral greater palatine foramina and bilateral uncinate processes bilaterally.   Attention then was directed toward the left sinuses. The left uncinate process was identified using the 0 degree endoscope and the left uncinate process was  removed systematically superiorly to inferiorly with back-biting forceps. Next, the left maxillary natural ostium was identified with the image guidance suction and then dilated using the guidewire and then the Acclarent balloon, with excellent left maxillary dilation and a rush of left maxillary purulence that was cultured and suctioned.  The left sphenoid natural ostium was then identified using the image  guidance suction and the left sphenoid was cannulated medial to the left superior turbinate and then dilated using the Acclarent balloon with excellent left sphenoid dilation.  Next I switched back to the 45 degree endoscope and curved image guidance suction. I removed the Agger Nasi cell and identified the left frontal ostium with the image guidance suction and then I dilated the left frontal sinus ostium using the Acclarent balloon with excellent dilation and a rush of purulent fluid that was suctioned out.  Attention then was directed toward the right sinuses. The right uncinate process was identified using the 0 degree endoscope and the right uncinate process was  removed systematically superiorly to inferiorly with back-biting forceps. Next, the right maxillary natural ostium was identified with the image guidance suction and then dilated using the guidewire and then the Acclarent balloon, with excellent right maxillary dilation.  The right sphenoid natural ostium was then identified using the image guidance suction and the right sphenoid was cannulated medial to the right superior turbinate and then dilated using the Acclarent balloon with excellent right sphenoid dilation.  Next I switched back to the 45 degree endoscope and curved image guidance suction. I removed the Agger Nasi cell and identified the right frontal ostium with the image guidance suction and then I dilated the right frontal sinus ostium using the Acclarent balloon with excellent dilation.  A thorough irrigation was then carried out in the nasal cavity, and bilateral absorbable surgiflo hemostatic foam was placed in the  middle meatal cavities. The patient's stomach was suctioned out using a flexible OGtube. The patient was awakened from anesthesia and extubated without difficulty. The patient tolerated the procedure well and returned to the recovery room in stable condition.   Dr. Melvenia Beam was present and performed the  entire procedure. 2:57 PM  12/20/2014  Melvenia Beam

## 2014-12-20 NOTE — Anesthesia Postprocedure Evaluation (Signed)
  Anesthesia Post-op Note  Patient: Beverly Lawrence  Procedure(s) Performed: Procedure(s): BILATERAL ENDOSCOPIC SINUS SURGERY WITH NAVIGATION/BALLOON DILATION (Bilateral)  Patient Location: PACU  Anesthesia Type:General  Level of Consciousness: awake, alert  and oriented  Airway and Oxygen Therapy: Patient Spontanous Breathing  Post-op Pain: mild  Post-op Assessment: Post-op Vital signs reviewed, Patient's Cardiovascular Status Stable, Respiratory Function Stable, Patent Airway and Pain level controlled              Post-op Vital Signs: stable  Last Vitals:  Filed Vitals:   12/20/14 1600  BP: 146/63  Pulse: 54  Temp: 36.2 C  Resp: 16    Complications: No apparent anesthesia complications

## 2014-12-20 NOTE — Discharge Instructions (Signed)
Rx for ibuprofen, antibiotic, and zofran sent to pharmacy. Can also use OTC tylenol PRN pain. No nose blowing x 2 weeks. Follow up with Dr. Emeline Darling in 2 weeks. May change drip pad as needed.

## 2014-12-20 NOTE — Transfer of Care (Signed)
Immediate Anesthesia Transfer of Care Note  Patient: Beverly Lawrence  Procedure(s) Performed: Procedure(s): BILATERAL ENDOSCOPIC SINUS SURGERY WITH NAVIGATION/BALLOON DILATION (Bilateral)  Patient Location: PACU  Anesthesia Type:General  Level of Consciousness: awake, alert  and oriented  Airway & Oxygen Therapy: Patient Spontanous Breathing and Patient connected to nasal cannula oxygen  Post-op Assessment: Report given to RN and Post -op Vital signs reviewed and stable  Post vital signs: Reviewed and stable  Last Vitals:  Filed Vitals:   12/20/14 1458  BP: 163/74  Pulse: 97  Temp: 37.1 C  Resp: 21    Complications: No apparent anesthesia complications

## 2014-12-20 NOTE — Anesthesia Procedure Notes (Signed)
Procedure Name: Intubation Date/Time: 12/20/2014 12:53 PM Performed by: Eligha Bridegroom Pre-anesthesia Checklist: Emergency Drugs available, Timeout performed, Patient identified, Suction available and Patient being monitored Patient Re-evaluated:Patient Re-evaluated prior to inductionOxygen Delivery Method: Circle system utilized Preoxygenation: Pre-oxygenation with 100% oxygen Intubation Type: IV induction Ventilation: Mask ventilation without difficulty Laryngoscope Size: Mac and 4 Grade View: Grade II Tube type: Oral Tube size: 7.0 mm Airway Equipment and Method: Stylet and LTA kit utilized Placement Confirmation: ETT inserted through vocal cords under direct vision,  breath sounds checked- equal and bilateral and positive ETCO2 Secured at: 21 cm Tube secured with: Tape Dental Injury: Teeth and Oropharynx as per pre-operative assessment

## 2014-12-20 NOTE — Anesthesia Preprocedure Evaluation (Addendum)
Anesthesia Evaluation  Patient identified by MRN, date of birth, ID band Patient awake    Reviewed: Allergy & Precautions, NPO status , Patient's Chart, lab work & pertinent test results  Airway Mallampati: II  TM Distance: >3 FB Neck ROM: Full    Dental  (+) Poor Dentition   Pulmonary former smoker,  breath sounds clear to auscultation        Cardiovascular hypertension, Rhythm:Regular     Neuro/Psych    GI/Hepatic   Endo/Other    Renal/GU      Musculoskeletal   Abdominal   Peds  Hematology   Anesthesia Other Findings   Reproductive/Obstetrics                            Anesthesia Physical Anesthesia Plan  ASA: III  Anesthesia Plan: General   Post-op Pain Management:    Induction: Intravenous  Airway Management Planned: Oral ETT  Additional Equipment:   Intra-op Plan:   Post-operative Plan: Extubation in OR  Informed Consent: I have reviewed the patients History and Physical, chart, labs and discussed the procedure including the risks, benefits and alternatives for the proposed anesthesia with the patient or authorized representative who has indicated his/her understanding and acceptance.   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

## 2014-12-21 NOTE — Addendum Note (Signed)
Addendum  created 12/21/14 0705 by Lizbett Garciagarcia, CRNA   Modules edited: Anesthesia Events, Narrator   Narrator:  Narrator: Event Log Edited    

## 2014-12-23 ENCOUNTER — Encounter (HOSPITAL_COMMUNITY): Payer: Self-pay | Admitting: Otolaryngology

## 2014-12-23 LAB — CULTURE, ROUTINE-SINUS: Culture: NO GROWTH

## 2014-12-25 LAB — ANAEROBIC CULTURE: Gram Stain: NONE SEEN

## 2015-05-05 ENCOUNTER — Other Ambulatory Visit (HOSPITAL_COMMUNITY): Payer: Self-pay | Admitting: Otolaryngology

## 2015-05-13 ENCOUNTER — Encounter (HOSPITAL_COMMUNITY): Payer: Self-pay

## 2015-05-13 ENCOUNTER — Encounter (HOSPITAL_COMMUNITY)
Admission: RE | Admit: 2015-05-13 | Discharge: 2015-05-13 | Disposition: A | Discharge status: Home / Self Care | Payer: Medicare Other | Source: Ambulatory Visit | Attending: Otolaryngology | Admitting: Otolaryngology

## 2015-05-13 DIAGNOSIS — Z792 Long term (current) use of antibiotics: Secondary | ICD-10-CM | POA: Diagnosis not present

## 2015-05-13 DIAGNOSIS — I252 Old myocardial infarction: Secondary | ICD-10-CM | POA: Diagnosis not present

## 2015-05-13 DIAGNOSIS — Z87891 Personal history of nicotine dependence: Secondary | ICD-10-CM | POA: Diagnosis not present

## 2015-05-13 DIAGNOSIS — K219 Gastro-esophageal reflux disease without esophagitis: Secondary | ICD-10-CM | POA: Diagnosis not present

## 2015-05-13 DIAGNOSIS — J449 Chronic obstructive pulmonary disease, unspecified: Secondary | ICD-10-CM | POA: Diagnosis not present

## 2015-05-13 DIAGNOSIS — Z7951 Long term (current) use of inhaled steroids: Secondary | ICD-10-CM | POA: Diagnosis not present

## 2015-05-13 DIAGNOSIS — Z79899 Other long term (current) drug therapy: Secondary | ICD-10-CM | POA: Diagnosis not present

## 2015-05-13 DIAGNOSIS — I110 Hypertensive heart disease with heart failure: Secondary | ICD-10-CM | POA: Diagnosis not present

## 2015-05-13 DIAGNOSIS — I5032 Chronic diastolic (congestive) heart failure: Secondary | ICD-10-CM | POA: Diagnosis not present

## 2015-05-13 DIAGNOSIS — J341 Cyst and mucocele of nose and nasal sinus: Secondary | ICD-10-CM | POA: Diagnosis not present

## 2015-05-13 DIAGNOSIS — J324 Chronic pansinusitis: Secondary | ICD-10-CM | POA: Diagnosis present

## 2015-05-13 DIAGNOSIS — Z8673 Personal history of transient ischemic attack (TIA), and cerebral infarction without residual deficits: Secondary | ICD-10-CM | POA: Diagnosis not present

## 2015-05-13 DIAGNOSIS — M1990 Unspecified osteoarthritis, unspecified site: Secondary | ICD-10-CM | POA: Diagnosis not present

## 2015-05-13 DIAGNOSIS — D591 Other autoimmune hemolytic anemias: Secondary | ICD-10-CM | POA: Diagnosis not present

## 2015-05-13 DIAGNOSIS — F419 Anxiety disorder, unspecified: Secondary | ICD-10-CM | POA: Diagnosis not present

## 2015-05-13 HISTORY — DX: Anxiety disorder, unspecified: F41.9

## 2015-05-13 HISTORY — DX: Heart failure, unspecified: I50.9

## 2015-05-13 HISTORY — DX: Cardiac murmur, unspecified: R01.1

## 2015-05-13 LAB — CBC
HEMATOCRIT: 31 % — AB (ref 36.0–46.0)
HEMOGLOBIN: 10.7 g/dL — AB (ref 12.0–15.0)
MCH: 36.1 pg — ABNORMAL HIGH (ref 26.0–34.0)
MCHC: 34.5 g/dL (ref 30.0–36.0)
MCV: 104.7 fL — ABNORMAL HIGH (ref 78.0–100.0)
Platelets: 221 10*3/uL (ref 150–400)
RBC: 2.96 MIL/uL — ABNORMAL LOW (ref 3.87–5.11)
RDW: 13.3 % (ref 11.5–15.5)
WBC: 5 10*3/uL (ref 4.0–10.5)

## 2015-05-13 NOTE — Pre-Procedure Instructions (Signed)
Beverly Lawrence  05/13/2015      [id number] [place] - [address] Beverly Lawrence is scheduled on   Thursday  05/15/15  [facility] Admitting at  1050A.M.  Call this number if you have problems the morning of surgery:  [phone]   Remember:  Do not eat food or drink liquids after midnight.  Take these medicines the morning of surgery with A SIP OF WATER    ALBUTEROL, AMLODIPINE (NORVASC), HYDROCODONE IF NEEDED, LABETALOL, METOPROLOL(LOPRESSOR), PANTOPRAZOLE (NO ASPIRIN, OR ASPIRIN PRODUCT, GOODY POWERS, BCS, NO IBUPROFEN/MOTRIN/ ADVIL)   Do not wear jewelry, make-up or nail polish.  Do not wear lotions, powders, or perfumes.  You may wear deodorant.  Do not shave 48 hours prior to surgery.  Men may shave face and neck.  Do not bring valuables to the hospital.  [facility] any belongings or valuables.  Contacts, dentures or bridgework may not be worn into surgery.  Leave your suitcase in the car.  After surgery it may be brought to your room.  For patients admitted to the hospital, discharge time will be determined by your treatment team.  Patients discharged the day of surgery will not be allowed to drive home.   Name and phone number of your driver:   Special instructions:  [facility] - Preparing for Surgery  Before surgery, you can play an important role.  Because skin is not sterile, your skin needs to be as free of germs as possible.  You can reduce the number of germs on you skin by washing with CHG (chlorahexidine gluconate) soap before surgery.  CHG is an antiseptic cleaner which kills germs and bonds with the skin to continue killing germs even after washing.  Please DO NOT use if you have an allergy to CHG or antibacterial soaps.  If your skin becomes reddened/irritated stop using the CHG and inform your nurse when  you arrive at Short Stay.  Do not shave (including legs and underarms) for at least 48 hours prior to the first CHG shower.  You may shave your face.  Please follow these instructions carefully:   1.  Shower with CHG Soap the night before surgery and the                                morning of Surgery.  2.  If you choose to wash your hair, wash your hair first as usual with your       normal shampoo.  3.  After you shampoo, rinse your hair and body thoroughly to remove the                      Shampoo.  4.  Use CHG as you would any other liquid soap.  You can apply chg directly       to the skin and wash gently with scrungie or a clean washcloth.  5.  Apply the CHG Soap to your body ONLY FROM THE NECK DOWN.        Do not use on open wounds or open sores.  Avoid contact with your eyes,       ears, mouth and genitals (private parts).  Wash genitals (private parts)       with your normal soap.  6.  Wash thoroughly, paying special attention to the area where your surgery        will be performed.  7.  Thoroughly rinse your body with warm water from the neck down.  8.  DO NOT shower/wash with your normal soap after using and rinsing off       the CHG Soap.  9.  Pat yourself dry with a clean towel.            10.  Wear clean pajamas.            11.  Place clean sheets on your bed the night of your first shower and do not        sleep with pets.  Day of Surgery  Do not apply any lotions/deoderants the morning of surgery.  Please wear clean clothes to the hospital/surgery center.    Please read over the following fact sheets that you were given. Pain Booklet, Coughing and Deep Breathing and Surgical Site Infection Prevention

## 2015-05-13 NOTE — Progress Notes (Signed)
REQUESTED STRESS TEST, ECHO, EKG, OV FROM DR. ANDREW CHIU AT [facility] [place].  ALSO REQUESTED BMP RESULT FROM PCP  [phone].

## 2015-05-14 ENCOUNTER — Encounter (HOSPITAL_COMMUNITY): Payer: Self-pay

## 2015-05-14 NOTE — H&P (Addendum)
05/15/2015 12:04 PM  Beverly Lawrence, [name]  PREOPERATIVE HISTORY AND PHYSICAL  CHIEF COMPLAINT: chronic pansinusitis, autoimmune disease, left frontal/ethmoid mucopyocele  HISTORY: This is a 69 year old who presents with chronic pansinusitis, autoimmune disease, left frontal/ethmoid mucopyocele.  The left frontal/ethmoid mucopyocele had required needle drainage x 2 and she now presents for revision functional endoscopic sinus surgery and Draf III frontal sinusotomy.  Dr. Emeline DarlingGore, [place] RileyMitchell has discussed the risks (perioperative MI, death, anoxic brain injury, bleeding, infection, scarring, need for revision surgery), benefits, and alternatives of this procedure. The patient understands the risks and would like to proceed with the procedure. The chances of success of the procedure are >50% and the patient understands this. I personally performed an examination of the patient within 24 hours of the procedure.  PAST MEDICAL HISTORY: Past Medical History  Diagnosis Date  . Hypertension   . PONV (postoperative nausea and vomiting)   . Diverticulitis of colon 1994    with colostomy placed and later reversed  . Peritonitis (HCC) 1977    hx of  . 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 Hyperthyroidism   . Hx: UTI (urinary tract infection)   . GERD (gastroesophageal reflux disease)   . Headache   . Arthritis     currently being seen by rheumatologist for confirmed diagnosing  . Anxiety   . Anemia     WARM AUTOIMMUNE HEMOLYTIC ANEMIA  (diagnosed 11/2014; [facility])  . Dysrhythmia     patient states that she has a "premature beat"  . CHF (congestive heart failure) (HCC)     diastolic CHF  . Heart murmur     moderate MR/AR, moderate pulm HTN 04/2015 echo    PAST SURGICAL HISTORY: Past Surgical History  Procedure Laterality Date  . Vitrectomy Left 2015  . Appendectomy  1966  . Ovarian cyst removal Left 1966  . Pilonidal cyst  1972  . Leg surgery  Bilateral 2011    vein ablation in both legs per patient  . Eye surgery Bilateral 2016    cataracts  . Sinus endo w/fusion Bilateral 12/20/2014    Procedure: BILATERAL ENDOSCOPIC SINUS SURGERY WITH NAVIGATION/BALLOON DILATION;  Surgeon: Beverly BeamMitchell Daionna Crossland, MD;  [facility];  Service: ENT;  Laterality: Bilateral;  . Colon surgery      colostomy placed in 1994 and reversed in 1995    MEDICATIONS: No current facility-administered medications on file prior to encounter.   Current Outpatient Prescriptions on File Prior to Encounter  Medication Sig Dispense Refill  . fluticasone (FLONASE) 50 MCG/ACT nasal spray Place 2 sprays into both nostrils daily.  11  . folic acid (FOLVITE) 1 MG tablet Take 1 mg by mouth 2 (two) times daily.  2  . metoprolol tartrate (LOPRESSOR) 25 MG tablet Take 25 mg by mouth 2 (two) times daily with a meal.     . pantoprazole (PROTONIX) 40 MG tablet Take 40 mg by mouth 2 (two) times daily.   3  . zolpidem (AMBIEN) 5 MG tablet Take 5 mg by mouth at bedtime as needed for sleep.       ALLERGIES: Allergies  Allergen Reactions  . Lisinopril Swelling    Facial swelling  . Codeine Other (See Comments)    unknown  . Erythromycin Other (See Comments)    unknown  . Morphine And Related Other (See Comments)    unknown  . Penicillins Hives and Other (See Comments)  Has patient had a PCN reaction causing immediate rash, facial/tongue/throat swelling, SOB or lightheadedness with hypotension: No Has patient had a PCN reaction causing severe rash involving mucus membranes or skin necrosis: No Has patient had a PCN reaction that required hospitalization No Has patient had a PCN reaction occurring within the last 10 years: No If all of the above answers are "NO", then may proceed with Cephalosporin use.   Marland Kitchen Percocet [Oxycodone-Acetaminophen] Other (See Comments)    unknown  . Percodan [Oxycodone-Aspirin] Other (See Comments)    unknown    SOCIAL HISTORY: Social History    Social History  . Marital Status: Married    Spouse Name: N/A  . Number of Children: N/A  . Years of Education: N/A   Occupational History  . Not on file.   Social History Main Topics  . Smoking status: Former Smoker    Quit date: 07/05/2004  . Smokeless tobacco: Not on file  . Alcohol Use: 1.2 oz/week    2 Glasses of wine per week  . Drug Use: No  . Sexual Activity: Not on file   Other Topics Concern  . Not on file   Social History Narrative    FAMILY HISTORY:No family history on file.  REVIEW OF SYSTEMS:  HEENT: left periorbital swelling/cellulitis, chronic sinusitis, otherwise per HPI x 12 systems     PHYSICAL EXAM:  GENERAL:  NAD VITAL SIGNS:  Filed Vitals:   05/15/15 1052  BP: 150/74  Pulse: 57  Temp: 98.3 F (36.8 C)  Resp: 18   SKIN:  Warm, dry HEENT:  left medial  Periorbital swelling and erythema consistent with left frontal/ethmoid mucopyocele, large septal perforation NECK:  supple ABDOMEN:  soft MUSCULOSKELETAL: normal stength PSYCH:  Normal affect NEUROLOGIC:  CN 2-12 intact and symmetric  DIAGNOSTIC STUDIES: maxillofacial Ct shows left frontal/ethmoid mucopyocele, large septal perforation, and bilateral grade II chronic pansinusitis, total Lund-McKay score in 24/24  ASSESSMENT AND PLAN: Plan to proceed with revision bilateral sinus surgery and Draf III frontal sinusotomy. Patient understands the risks, benefits, and alternatives. Informed written consent signed witnessed and on chart 05/15/2015  12:05 PM Beverly Beam

## 2015-05-14 NOTE — Progress Notes (Signed)
Anesthesia Chart Review: Patient is a 69 year old female scheduled for endoscopic sinus surgery with navigation tomorrow by Dr. Emeline Lawrence. DX: Frontal mucocele. She underwent bilateral sinus balloon dilation on 12/20/14.   History includes former smoker, post-operative N/V, HTN, TIA, hyperthyroidism, GERD, murmur with moderate MR/AR, diastolic CHF, dysrhythmia ("premature" beats), CVA (small remote lacunar infarcts, right basal ganglia and cerebellar hemisphere by 08/2014 MRI), RLL lung nodule 04/2015 CT, arthritis, anxiety, appendectomy, diverticulitis s/p colon resection with colostomy '94 with reversal '95, ovarian cyst excision. 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 Foy GuadalajaraJason Lawrence on 05/02/15 Beverly Lawrence([facility], see Care Everywhere). His note states: "She is in need of sinus surgery. I think from a hematologic perspective she is stable and safe for surgery. I explained to her that if she requires a blood transfusion after the procedure, her type and screen will be positive and they will not be able to find a compatible unit of blood based on blood bank testing given her warm autoimmune hemolytic anemia. In my opinion, she could safely receive blood if given slowly and if premedicated with Tylenol and corticosteroids."  Cardiologist is Dr. Wille Lawrence, but last seen by Dr. Rhona Lawrence with [facility] (see Care Everywhere), last visit 04/28/15 for hospital follow-up for 04/08/15 admission for lightheadedness/near syncope with hyponatremia possibly related to ETOH. Troponin was also elevated (peat 1.32)/NSTEMI on 04/09/15 with elevated BNP 4962 (up from 646 on 03/07/15). Following low risk stress test, cardiology recommended medical therapy.  Found to have RLL pulmonary nodule with 6 month follow-up recommended (pulmonology, Dr. Levi Lawrence) and out-patient PFT.   PCP is Dr. Loyal JacobsonMichael Lawrence.  Meds include albuterol, amlodipine, Lipitor, Flonase, folic acid, HCTZ, labetalol,  Lopressor, Ditropan XL, Protonix, promethazine, Bactrim-DS, thiamine, Ambien.   04/09/15 EKG result narrative (Care Everywhere[facility] with PACs, ST/T wave abnormality, consider anterior ischemia.   04/10/15 Nuclear stress test ([facility], see Care Everywhere): IMPRESSION: 1. No evidence of inducible myocardial ischemia. Small fixed perfusion defect of the mid to distal anteroseptal wall may reflect a prior small infarction. 2. Normal left ventricular wall motion. 3. Left ventricular ejection fraction 68% 4. Low-risk stress test findings*.  04/09/15 Echo ([facility], see Care Everywhere): Conclusions Summary Normal left ventricular size and systolic function with no appreciable segmental abnormality. Moderate mitral and aortic regurgitation Moderate pulmonary hypertension. Estimated pulmonary artery pressure is 57mmHg. In comparison to prior echo on 01/21/15, Estimated RVSP has improved on current study, otherwise no changes  Preoperative labs noted. H/H 10.7/31.0, stable since 08/2014. BM[facility] W[facility]15 [facility] copy in chart). LFTS WNLs 104/16 (Care Everywhere).  Discussed above with anesthesiologist Dr. Krista BlueSinger. Medical therapy recommended by cardiology following non-ischemic stress test post-NSTEMI in the setting of acute CHF hospitalization. She has had recent cardiology follow-up and has perioperative hematology recommendations if needed for anemia. If no acute changes then it is anticipated that she can proceed as planned.  Beverly Lawrence Beverly Salinger, PA-C [facility]An[facility]iology Phone [phone] 05/14/2015 10:39 AM

## 2015-05-15 ENCOUNTER — Observation Stay (HOSPITAL_COMMUNITY)
Admission: RE | Admit: 2015-05-15 | Discharge: 2015-05-16 | Disposition: A | Discharge status: Home / Self Care | Payer: Medicare Other | Source: Ambulatory Visit | Attending: Otolaryngology | Admitting: Otolaryngology

## 2015-05-15 ENCOUNTER — Ambulatory Visit (HOSPITAL_COMMUNITY): Payer: Medicare Other | Admitting: Certified Registered Nurse Anesthetist

## 2015-05-15 ENCOUNTER — Ambulatory Visit (HOSPITAL_COMMUNITY): Payer: Medicare Other | Admitting: Vascular Surgery

## 2015-05-15 ENCOUNTER — Encounter (HOSPITAL_COMMUNITY): Payer: Self-pay | Admitting: Certified Registered Nurse Anesthetist

## 2015-05-15 ENCOUNTER — Encounter (HOSPITAL_COMMUNITY)
Admission: RE | Disposition: A | Discharge status: Home / Self Care | Payer: Self-pay | Source: Ambulatory Visit | Attending: Otolaryngology

## 2015-05-15 DIAGNOSIS — J341 Cyst and mucocele of nose and nasal sinus: Secondary | ICD-10-CM | POA: Insufficient documentation

## 2015-05-15 DIAGNOSIS — J449 Chronic obstructive pulmonary disease, unspecified: Secondary | ICD-10-CM | POA: Insufficient documentation

## 2015-05-15 DIAGNOSIS — Z8673 Personal history of transient ischemic attack (TIA), and cerebral infarction without residual deficits: Secondary | ICD-10-CM | POA: Insufficient documentation

## 2015-05-15 DIAGNOSIS — K219 Gastro-esophageal reflux disease without esophagitis: Secondary | ICD-10-CM | POA: Diagnosis not present

## 2015-05-15 DIAGNOSIS — I110 Hypertensive heart disease with heart failure: Secondary | ICD-10-CM | POA: Insufficient documentation

## 2015-05-15 DIAGNOSIS — Z7951 Long term (current) use of inhaled steroids: Secondary | ICD-10-CM | POA: Insufficient documentation

## 2015-05-15 DIAGNOSIS — D591 Other autoimmune hemolytic anemias: Secondary | ICD-10-CM | POA: Insufficient documentation

## 2015-05-15 DIAGNOSIS — I5032 Chronic diastolic (congestive) heart failure: Secondary | ICD-10-CM | POA: Insufficient documentation

## 2015-05-15 DIAGNOSIS — J324 Chronic pansinusitis: Principal | ICD-10-CM | POA: Insufficient documentation

## 2015-05-15 DIAGNOSIS — Z87891 Personal history of nicotine dependence: Secondary | ICD-10-CM | POA: Insufficient documentation

## 2015-05-15 DIAGNOSIS — I252 Old myocardial infarction: Secondary | ICD-10-CM | POA: Insufficient documentation

## 2015-05-15 DIAGNOSIS — M1990 Unspecified osteoarthritis, unspecified site: Secondary | ICD-10-CM | POA: Diagnosis not present

## 2015-05-15 DIAGNOSIS — Z79899 Other long term (current) drug therapy: Secondary | ICD-10-CM | POA: Insufficient documentation

## 2015-05-15 DIAGNOSIS — Z792 Long term (current) use of antibiotics: Secondary | ICD-10-CM | POA: Insufficient documentation

## 2015-05-15 DIAGNOSIS — F419 Anxiety disorder, unspecified: Secondary | ICD-10-CM | POA: Insufficient documentation

## 2015-05-15 DIAGNOSIS — J329 Chronic sinusitis, unspecified: Secondary | ICD-10-CM | POA: Diagnosis present

## 2015-05-15 HISTORY — PX: NASAL SINUS SURGERY: SHX719

## 2015-05-15 LAB — CBC
HCT: 28.9 % — ABNORMAL LOW (ref 36.0–46.0)
Hemoglobin: 9.8 g/dL — ABNORMAL LOW (ref 12.0–15.0)
MCH: 36.2 pg — AB (ref 26.0–34.0)
MCHC: 33.9 g/dL (ref 30.0–36.0)
MCV: 106.6 fL — ABNORMAL HIGH (ref 78.0–100.0)
PLATELETS: 210 10*3/uL (ref 150–400)
RBC: 2.71 MIL/uL — AB (ref 3.87–5.11)
RDW: 13.7 % (ref 11.5–15.5)
WBC: 3.7 10*3/uL — ABNORMAL LOW (ref 4.0–10.5)

## 2015-05-15 LAB — COMPREHENSIVE METABOLIC PANEL
ALBUMIN: 3.6 g/dL (ref 3.5–5.0)
ALT: 11 U/L — ABNORMAL LOW (ref 14–54)
ANION GAP: 8 (ref 5–15)
AST: 21 U/L (ref 15–41)
Alkaline Phosphatase: 57 U/L (ref 38–126)
BUN: 10 mg/dL (ref 6–20)
CHLORIDE: 104 mmol/L (ref 101–111)
CO2: 21 mmol/L — AB (ref 22–32)
Calcium: 8.9 mg/dL (ref 8.9–10.3)
Creatinine, Ser: 0.95 mg/dL (ref 0.44–1.00)
GFR calc Af Amer: >60 mL/min (ref >60)
GFR calc non Af Amer: 59 mL/min — ABNORMAL LOW (ref >60)
GLUCOSE: 182 mg/dL — AB (ref 65–99)
POTASSIUM: 4.1 mmol/L (ref 3.5–5.1)
SODIUM: 133 mmol/L — AB (ref 135–145)
TOTAL PROTEIN: 6.8 g/dL (ref 6.5–8.1)
Total Bilirubin: 0.8 mg/dL (ref 0.3–1.2)

## 2015-05-15 LAB — BASIC METABOLIC PANEL
Anion gap: 11 (ref 5–15)
BUN: 12 mg/dL (ref 6–20)
CO2: 20 mmol/L — ABNORMAL LOW (ref 22–32)
Calcium: 9.1 mg/dL (ref 8.9–10.3)
Chloride: 101 mmol/L (ref 101–111)
Creatinine, Ser: 1.02 mg/dL — ABNORMAL HIGH (ref 0.44–1.00)
GFR calc Af Amer: >60 mL/min (ref >60)
GFR calc non Af Amer: 54 mL/min — ABNORMAL LOW (ref >60)
Glucose, Bld: 107 mg/dL — ABNORMAL HIGH (ref 65–99)
Potassium: 3.9 mmol/L (ref 3.5–5.1)
Sodium: 132 mmol/L — ABNORMAL LOW (ref 135–145)

## 2015-05-15 LAB — PHOSPHORUS: PHOSPHORUS: 4 mg/dL (ref 2.5–4.6)

## 2015-05-15 LAB — MAGNESIUM: MAGNESIUM: 1.7 mg/dL (ref 1.7–2.4)

## 2015-05-15 SURGERY — SINUS SURGERY, ENDOSCOPIC
Anesthesia: General | Site: Nose | Laterality: Bilateral

## 2015-05-15 MED ORDER — VITAMIN B-1 100 MG PO TABS
100.0000 mg | ORAL_TABLET | Freq: Every day | ORAL | Status: DC
  Administered 2015-05-16: 100 mg via ORAL
  Filled 2015-05-15: qty 1

## 2015-05-15 MED ORDER — ONDANSETRON HCL 4 MG/2ML IJ SOLN
INTRAMUSCULAR | Status: DC | PRN
  Administered 2015-05-15: 4 mg via INTRAVENOUS

## 2015-05-15 MED ORDER — PHENYLEPHRINE HCL 10 MG/ML IJ SOLN: INTRAMUSCULAR | Status: DC | PRN

## 2015-05-15 MED ORDER — ARTIFICIAL TEARS OP OINT
TOPICAL_OINTMENT | OPHTHALMIC | Status: AC
  Filled 2015-05-15: qty 3.5

## 2015-05-15 MED ORDER — MIDAZOLAM HCL 2 MG/2ML IJ SOLN
INTRAMUSCULAR | Status: AC
  Administered 2015-05-15 (×2): 1 mg via INTRAVENOUS
  Filled 2015-05-15: qty 2

## 2015-05-15 MED ORDER — SODIUM CHLORIDE 1 G PO TABS
1.0000 g | ORAL_TABLET | Freq: Three times a day (TID) | ORAL | Status: DC
  Administered 2015-05-15 – 2015-05-16 (×2): 1 g via ORAL
  Filled 2015-05-15 (×5): qty 1

## 2015-05-15 MED ORDER — HYDROCHLOROTHIAZIDE 25 MG PO TABS
12.5000 mg | ORAL_TABLET | Freq: Every day | ORAL | Status: DC
  Administered 2015-05-16: 12.5 mg via ORAL
  Filled 2015-05-15: qty 1

## 2015-05-15 MED ORDER — SODIUM CHLORIDE 0.9 % IR SOLN
Status: DC | PRN
  Administered 2015-05-15 (×2): 1000 mL

## 2015-05-15 MED ORDER — ROCURONIUM BROMIDE 50 MG/5ML IV SOLN
INTRAVENOUS | Status: AC
  Filled 2015-05-15: qty 1

## 2015-05-15 MED ORDER — ACETAMINOPHEN 650 MG RE SUPP: 650.0000 mg | Freq: Four times a day (QID) | RECTAL | Status: DC | PRN

## 2015-05-15 MED ORDER — FLUORESCEIN SODIUM 1 MG OP STRP
ORAL_STRIP | OPHTHALMIC | Status: AC
  Filled 2015-05-15: qty 1

## 2015-05-15 MED ORDER — PROMETHAZINE HCL 12.5 MG PO TABS
12.5000 mg | ORAL_TABLET | Freq: Four times a day (QID) | ORAL | Status: DC | PRN
  Administered 2015-05-15: 12.5 mg via ORAL
  Filled 2015-05-15 (×2): qty 1

## 2015-05-15 MED ORDER — ACETAMINOPHEN 325 MG PO TABS: 650.0000 mg | ORAL_TABLET | Freq: Four times a day (QID) | ORAL | Status: DC | PRN

## 2015-05-15 MED ORDER — HYDROMORPHONE HCL 1 MG/ML IJ SOLN
INTRAMUSCULAR | Status: AC
  Filled 2015-05-15: qty 1

## 2015-05-15 MED ORDER — ONDANSETRON 4 MG PO TBDP: 4.0000 mg | ORAL_TABLET | Freq: Four times a day (QID) | ORAL | Status: DC | PRN

## 2015-05-15 MED ORDER — MUPIROCIN 2 % EX OINT
TOPICAL_OINTMENT | CUTANEOUS | Status: AC
  Filled 2015-05-15: qty 22

## 2015-05-15 MED ORDER — ROCURONIUM BROMIDE 100 MG/10ML IV SOLN
INTRAVENOUS | Status: DC | PRN
  Administered 2015-05-15: 40 mg via INTRAVENOUS

## 2015-05-15 MED ORDER — HYDROCODONE-ACETAMINOPHEN 5-325 MG PO TABS
ORAL_TABLET | ORAL | Status: AC
Start: 2015-05-15 — End: 2015-05-16
  Filled 2015-05-15: qty 1

## 2015-05-15 MED ORDER — PHENYLEPHRINE HCL 10 MG/ML IJ SOLN
INTRAMUSCULAR | Status: DC | PRN
  Administered 2015-05-15: 80 ug via INTRAVENOUS

## 2015-05-15 MED ORDER — MUPIROCIN CALCIUM 2 % NA OINT
TOPICAL_OINTMENT | NASAL | Status: DC | PRN
  Administered 2015-05-15: 1 via NASAL

## 2015-05-15 MED ORDER — PANTOPRAZOLE SODIUM 40 MG PO TBEC
40.0000 mg | DELAYED_RELEASE_TABLET | Freq: Two times a day (BID) | ORAL | Status: DC
  Administered 2015-05-15 – 2015-05-16 (×2): 40 mg via ORAL
  Filled 2015-05-15 (×2): qty 1

## 2015-05-15 MED ORDER — PROPOFOL 10 MG/ML IV BOLUS
INTRAVENOUS | Status: DC | PRN
  Administered 2015-05-15: 30 mg via INTRAVENOUS
  Administered 2015-05-15: 120 mg via INTRAVENOUS

## 2015-05-15 MED ORDER — LIDOCAINE HCL (CARDIAC) 20 MG/ML IV SOLN
INTRAVENOUS | Status: AC
  Filled 2015-05-15: qty 5

## 2015-05-15 MED ORDER — LABETALOL HCL 100 MG PO TABS
100.0000 mg | ORAL_TABLET | Freq: Two times a day (BID) | ORAL | Status: DC
  Administered 2015-05-16: 100 mg via ORAL
  Filled 2015-05-15: qty 1

## 2015-05-15 MED ORDER — FENTANYL CITRATE (PF) 250 MCG/5ML IJ SOLN
INTRAMUSCULAR | Status: AC
  Filled 2015-05-15: qty 5

## 2015-05-15 MED ORDER — PROMETHAZINE HCL 25 MG/ML IJ SOLN: 6.2500 mg | INTRAMUSCULAR | Status: DC | PRN

## 2015-05-15 MED ORDER — ARTIFICIAL TEARS OP OINT
TOPICAL_OINTMENT | OPHTHALMIC | Status: DC | PRN
  Administered 2015-05-15: 1 via OPHTHALMIC

## 2015-05-15 MED ORDER — DIPHENHYDRAMINE HCL 50 MG/ML IJ SOLN: 12.5000 mg | Freq: Four times a day (QID) | INTRAMUSCULAR | Status: DC | PRN

## 2015-05-15 MED ORDER — ZOLPIDEM TARTRATE 5 MG PO TABS: 5.0000 mg | ORAL_TABLET | Freq: Every evening | ORAL | Status: DC | PRN

## 2015-05-15 MED ORDER — GLYCOPYRROLATE 0.2 MG/ML IJ SOLN
INTRAMUSCULAR | Status: AC
  Filled 2015-05-15: qty 2

## 2015-05-15 MED ORDER — EPHEDRINE SULFATE 50 MG/ML IJ SOLN
INTRAMUSCULAR | Status: DC | PRN
  Administered 2015-05-15 (×5): 10 mg via INTRAVENOUS

## 2015-05-15 MED ORDER — LIDOCAINE-EPINEPHRINE 1 %-1:100000 IJ SOLN
INTRAMUSCULAR | Status: DC | PRN
  Administered 2015-05-15: 10 mL

## 2015-05-15 MED ORDER — AMLODIPINE BESYLATE 10 MG PO TABS
10.0000 mg | ORAL_TABLET | Freq: Every day | ORAL | Status: DC
  Administered 2015-05-16: 10 mg via ORAL
  Filled 2015-05-15: qty 1

## 2015-05-15 MED ORDER — OXYBUTYNIN CHLORIDE ER 5 MG PO TB24
5.0000 mg | ORAL_TABLET | Freq: Every day | ORAL | Status: DC
  Administered 2015-05-15: 5 mg via ORAL
  Filled 2015-05-15 (×2): qty 1

## 2015-05-15 MED ORDER — DEXAMETHASONE SODIUM PHOSPHATE 10 MG/ML IJ SOLN
10.0000 mg | Freq: Once | INTRAMUSCULAR | Status: AC
  Administered 2015-05-15: 10 mg via INTRAVENOUS

## 2015-05-15 MED ORDER — METOPROLOL TARTRATE 25 MG PO TABS
25.0000 mg | ORAL_TABLET | Freq: Two times a day (BID) | ORAL | Status: DC
  Administered 2015-05-15 – 2015-05-16 (×2): 25 mg via ORAL
  Filled 2015-05-15 (×2): qty 1

## 2015-05-15 MED ORDER — LIDOCAINE-EPINEPHRINE 1 %-1:100000 IJ SOLN
INTRAMUSCULAR | Status: AC
  Filled 2015-05-15: qty 1

## 2015-05-15 MED ORDER — CIPROFLOXACIN IN D5W 400 MG/200ML IV SOLN
400.0000 mg | Freq: Two times a day (BID) | INTRAVENOUS | Status: DC
  Administered 2015-05-15 – 2015-05-16 (×2): 400 mg via INTRAVENOUS
  Filled 2015-05-15 (×3): qty 200

## 2015-05-15 MED ORDER — FOLIC ACID 1 MG PO TABS
1.0000 mg | ORAL_TABLET | Freq: Two times a day (BID) | ORAL | Status: DC
  Administered 2015-05-16: 1 mg via ORAL
  Filled 2015-05-15: qty 1

## 2015-05-15 MED ORDER — HYDROMORPHONE HCL 1 MG/ML IJ SOLN: 0.5000 mg | INTRAMUSCULAR | Status: DC | PRN

## 2015-05-15 MED ORDER — MUPIROCIN CALCIUM 2 % EX CREA
TOPICAL_CREAM | CUTANEOUS | Status: DC | PRN
  Administered 2015-05-15: 1 via TOPICAL

## 2015-05-15 MED ORDER — NEOSTIGMINE METHYLSULFATE 10 MG/10ML IV SOLN
INTRAVENOUS | Status: DC | PRN
  Administered 2015-05-15: 2 mg via INTRAVENOUS

## 2015-05-15 MED ORDER — HYDROCODONE-ACETAMINOPHEN 5-325 MG PO TABS
1.0000 | ORAL_TABLET | ORAL | Status: DC | PRN
  Administered 2015-05-15: 2 via ORAL
  Administered 2015-05-15 (×2): 1 via ORAL
  Administered 2015-05-16 (×3): 2 via ORAL
  Filled 2015-05-15 (×4): qty 2

## 2015-05-15 MED ORDER — MIDAZOLAM HCL 2 MG/2ML IJ SOLN: 0.5000 mg | Freq: Once | INTRAMUSCULAR | Status: DC | PRN

## 2015-05-15 MED ORDER — CIPROFLOXACIN HCL 0.3 % OP SOLN
1.0000 [drp] | OPHTHALMIC | Status: DC
  Administered 2015-05-15 – 2015-05-16 (×4): 1 [drp] via OPHTHALMIC
  Filled 2015-05-15: qty 2.5

## 2015-05-15 MED ORDER — ALBUTEROL SULFATE (2.5 MG/3ML) 0.083% IN NEBU: 3.0000 mL | INHALATION_SOLUTION | Freq: Four times a day (QID) | RESPIRATORY_TRACT | Status: DC | PRN

## 2015-05-15 MED ORDER — HYDROMORPHONE HCL 1 MG/ML IJ SOLN
0.2500 mg | INTRAMUSCULAR | Status: DC | PRN
  Administered 2015-05-15 (×2): 0.5 mg via INTRAVENOUS

## 2015-05-15 MED ORDER — SENNOSIDES-DOCUSATE SODIUM 8.6-50 MG PO TABS: 1.0000 | ORAL_TABLET | Freq: Every evening | ORAL | Status: DC | PRN

## 2015-05-15 MED ORDER — BACITRACIN-POLYMYXIN B 500-10000 UNIT/GM OP OINT
TOPICAL_OINTMENT | Freq: Three times a day (TID) | OPHTHALMIC | Status: DC
  Administered 2015-05-15 – 2015-05-16 (×2): via OPHTHALMIC
  Filled 2015-05-15: qty 3.5

## 2015-05-15 MED ORDER — GLYCOPYRROLATE 0.2 MG/ML IJ SOLN
INTRAMUSCULAR | Status: DC | PRN
  Administered 2015-05-15: .2 mg via INTRAVENOUS

## 2015-05-15 MED ORDER — FENTANYL CITRATE (PF) 100 MCG/2ML IJ SOLN
INTRAMUSCULAR | Status: DC | PRN
  Administered 2015-05-15: 150 ug via INTRAVENOUS

## 2015-05-15 MED ORDER — SODIUM CHLORIDE 0.9 % IJ SOLN
INTRAMUSCULAR | Status: AC
  Filled 2015-05-15: qty 10

## 2015-05-15 MED ORDER — PROPOFOL 10 MG/ML IV BOLUS
INTRAVENOUS | Status: AC
  Filled 2015-05-15: qty 20

## 2015-05-15 MED ORDER — OXYMETAZOLINE HCL 0.05 % NA SOLN
NASAL | Status: DC | PRN
  Administered 2015-05-15: 1

## 2015-05-15 MED ORDER — SODIUM CHLORIDE 0.9 % IV SOLN: INTRAVENOUS | Status: DC

## 2015-05-15 MED ORDER — OXYMETAZOLINE HCL 0.05 % NA SOLN
NASAL | Status: AC
  Filled 2015-05-15: qty 15

## 2015-05-15 MED ORDER — BISACODYL 5 MG PO TBEC: 5.0000 mg | DELAYED_RELEASE_TABLET | Freq: Every day | ORAL | Status: DC | PRN

## 2015-05-15 MED ORDER — CLINDAMYCIN PHOSPHATE 600 MG/50ML IV SOLN
600.0000 mg | Freq: Once | INTRAVENOUS | Status: AC
  Administered 2015-05-15: 600 mg via INTRAVENOUS
  Filled 2015-05-15: qty 50

## 2015-05-15 MED ORDER — ONDANSETRON HCL 4 MG/2ML IJ SOLN: 4.0000 mg | Freq: Four times a day (QID) | INTRAMUSCULAR | Status: DC | PRN

## 2015-05-15 MED ORDER — LIDOCAINE HCL (CARDIAC) 20 MG/ML IV SOLN
INTRAVENOUS | Status: DC | PRN
  Administered 2015-05-15: 20 mg via INTRAVENOUS

## 2015-05-15 MED ORDER — LACTATED RINGERS IV SOLN
INTRAVENOUS | Status: DC
  Administered 2015-05-15 (×2): via INTRAVENOUS

## 2015-05-15 MED ORDER — MEPERIDINE HCL 25 MG/ML IJ SOLN: 6.2500 mg | INTRAMUSCULAR | Status: DC | PRN

## 2015-05-15 MED ORDER — EPHEDRINE SULFATE 50 MG/ML IJ SOLN
INTRAMUSCULAR | Status: AC
  Filled 2015-05-15: qty 1

## 2015-05-15 MED ORDER — ATORVASTATIN CALCIUM 40 MG PO TABS
40.0000 mg | ORAL_TABLET | Freq: Every day | ORAL | Status: DC
  Administered 2015-05-15: 40 mg via ORAL
  Filled 2015-05-15: qty 1

## 2015-05-15 MED ORDER — DIPHENHYDRAMINE HCL 12.5 MG/5ML PO ELIX: 12.5000 mg | ORAL_SOLUTION | Freq: Four times a day (QID) | ORAL | Status: DC | PRN

## 2015-05-15 MED ORDER — ONDANSETRON HCL 4 MG/2ML IJ SOLN
INTRAMUSCULAR | Status: AC
  Filled 2015-05-15: qty 2

## 2015-05-15 SURGICAL SUPPLY — 62 items
APL SKNCLS STERI-STRIP NONHPOA (GAUZE/BANDAGES/DRESSINGS) ×2
ATTRACTOMAT 16X20 MAGNETIC DRP (DRAPES) IMPLANT
BENZOIN TINCTURE PRP APPL 2/3 (GAUZE/BANDAGES/DRESSINGS) ×3 IMPLANT
BLADE RAD 40 CVD SINUS 4MM (BLADE) ×2 IMPLANT
BLADE RAD60 ROTATE M4 4 5PK (BLADE) ×2 IMPLANT
BLADE ROTATE RAD 40 4 M4 (BLADE) ×2 IMPLANT
BLADE ROTATE TRICUT 4X13 M4 (BLADE) ×3 IMPLANT
BLADE SURG 15 STRL LF DISP TIS (BLADE) IMPLANT
BLADE SURG 15 STRL SS (BLADE)
BUR DIAMOND 13X5 70D (BURR) ×2 IMPLANT
BUR DIAMOND 3PK HI SPD 70DG (BURR) ×2 IMPLANT
BUR DIAMOND CURV 15X5 15D (BURR) ×2 IMPLANT
CANISTER SUCTION 2500CC (MISCELLANEOUS) ×5 IMPLANT
COAGULATOR SUCT SWTCH 10FR 6 (ELECTROSURGICAL) ×3 IMPLANT
CONT SPEC 4OZ CLIKSEAL STRL BL (MISCELLANEOUS) ×4 IMPLANT
CORDS BIPOLAR (ELECTRODE) ×2 IMPLANT
CRADLE DONUT ADULT HEAD (MISCELLANEOUS) ×2 IMPLANT
DRAPE PROXIMA HALF (DRAPES) IMPLANT
DRESSING NASAL POPE 10X1.5X2.5 (GAUZE/BANDAGES/DRESSINGS) ×1 IMPLANT
DRSG NASAL POPE 10X1.5X2.5 (GAUZE/BANDAGES/DRESSINGS) ×3
DRSG NASOPORE 8CM (GAUZE/BANDAGES/DRESSINGS) IMPLANT
DURASEAL SPINE SEALANT 3ML (MISCELLANEOUS) IMPLANT
ELECT REM PT RETURN 9FT ADLT (ELECTROSURGICAL) ×3
ELECTRODE REM PT RTRN 9FT ADLT (ELECTROSURGICAL) ×1 IMPLANT
FILTER ARTHROSCOPY CONVERTOR (FILTER) ×6 IMPLANT
FLOSEAL 10ML (HEMOSTASIS) IMPLANT
GAUZE IODOFORM PACK 1/2 7832 (GAUZE/BANDAGES/DRESSINGS) ×2 IMPLANT
GLOVE BIOGEL PI IND STRL 6.5 (GLOVE) ×1 IMPLANT
GLOVE BIOGEL PI INDICATOR 6.5 (GLOVE) ×1
GLOVE SURG SS PI 6.5 STRL IVOR (GLOVE) ×2 IMPLANT
GLOVE SURG SS PI 7.5 STRL IVOR (GLOVE) ×3 IMPLANT
GOWN STRL REUS W/ TWL LRG LVL3 (GOWN DISPOSABLE) ×4 IMPLANT
GOWN STRL REUS W/TWL LRG LVL3 (GOWN DISPOSABLE) ×6
KIT BASIN OR (CUSTOM PROCEDURE TRAY) ×3 IMPLANT
KIT ROOM TURNOVER OR (KITS) ×3 IMPLANT
NDL HYPO 25GX1X1/2 BEV (NEEDLE) IMPLANT
NDL SPNL 20GX3.5 QUINCKE YW (NEEDLE) ×1 IMPLANT
NEEDLE HYPO 25GX1X1/2 BEV (NEEDLE) ×3 IMPLANT
NEEDLE SPNL 20GX3.5 QUINCKE YW (NEEDLE) ×3 IMPLANT
NS IRRIG 1000ML POUR BTL (IV SOLUTION) ×3 IMPLANT
PAD ARMBOARD 7.5X6 YLW CONV (MISCELLANEOUS) ×6 IMPLANT
PAD ENT ADHESIVE 25PK (MISCELLANEOUS) ×1 IMPLANT
PATTIES SURGICAL .5 X3 (DISPOSABLE) ×3 IMPLANT
SHEATH ENDOSCRUB 0 DEG (SHEATH) ×3 IMPLANT
SHEATH ENDOSCRUB 30 DEG (SHEATH) IMPLANT
SHEATH ENDOSCRUB 45 DEG (SHEATH) ×3 IMPLANT
SOLUTION ANTI FOG 6CC (MISCELLANEOUS) ×3 IMPLANT
SPECIMEN JAR SMALL (MISCELLANEOUS) ×6 IMPLANT
STRIP CLOSURE SKIN 1/2X4 (GAUZE/BANDAGES/DRESSINGS) ×3 IMPLANT
SUT ETHILON 3 0 PS 1 (SUTURE) ×2 IMPLANT
SUT PLAIN 4 0 ~~LOC~~ 1 (SUTURE) ×2 IMPLANT
SUT SILK 2 0 SH (SUTURE) ×12 IMPLANT
SWAB COLLECTION DEVICE MRSA (MISCELLANEOUS) IMPLANT
SYR 50ML SLIP (SYRINGE) IMPLANT
SYRINGE 10CC LL (SYRINGE) ×3 IMPLANT
TOWEL OR 17X24 6PK STRL BLUE (TOWEL DISPOSABLE) ×3 IMPLANT
TRACKER ENT INSTRUMENT (MISCELLANEOUS) ×1 IMPLANT
TRACKER ENT PATIENT (MISCELLANEOUS) ×3 IMPLANT
TRAY ENT MC OR (CUSTOM PROCEDURE TRAY) ×3 IMPLANT
TUBE CONNECTING 12X1/4 (SUCTIONS) ×3 IMPLANT
TUBING STRAIGHTSHOT EPS 5PK (TUBING) ×3 IMPLANT
WIPE INSTRUMENT VISIWIPE 73X73 (MISCELLANEOUS) ×3 IMPLANT

## 2015-05-15 NOTE — Progress Notes (Signed)
Subjective: S/p revision sinus surgery with Draf 3 frontal sinusotomy  Objective: Vital signs in last 24 hours: Temp:  [98 F (36.7 C)-98.3 F (36.8 C)] 98 F (36.7 C) (11/10 1608) Pulse Rate:  [57-69] 69 (11/10 1615) Resp:  [15-18] 15 (11/10 1615) BP: (103-150)/(60-74) 103/65 mmHg (11/10 1615) SpO2:  [97 %-99 %] 99 % (11/10 1615) Weight:  [66.588 kg (146 lb 12.8 oz)] 66.588 kg (146 lb 12.8 oz) (11/10 1052)  Awake, alert, some mild right bloody nasal drainage with bilateral packing and nasal drip pad in place. EOMI, PERRLA OU with improved left medial periorbital erythema and healing I&D site.  @LABLAST2 (wbc:2,hgb:2,hct:2,plt:2)  Recent Labs  05/15/15 1107  NA 132*  K 3.9  CL 101  CO2 20*  GLUCOSE 107*  BUN 12  CREATININE 1.02*  CALCIUM 9.1    Medications:  Scheduled Meds: . amLODipine  10 mg Oral Daily  . atorvastatin  40 mg Oral q1800  . ciprofloxacin  1 drop Left Eye Q4H while awake  . ciprofloxacin  400 mg Intravenous Q12H  . folic acid  1 mg Oral BID  . hydrochlorothiazide  12.5 mg Oral Daily  . HYDROmorphone      . labetalol  100 mg Oral BID  . metoprolol tartrate  25 mg Oral BID WC  . oxybutynin  5 mg Oral QHS  . pantoprazole  40 mg Oral BID  . sodium chloride  1 g Oral TID WC  . thiamine  100 mg Oral Daily   Continuous Infusions: . sodium chloride    . lactated ringers 10 mL/hr at 05/15/15 1112   PRN Meds:.acetaminophen **OR** acetaminophen, albuterol, bisacodyl, diphenhydrAMINE **OR** diphenhydrAMINE, HYDROcodone-acetaminophen, HYDROmorphone (DILAUDID) injection, HYDROmorphone (DILAUDID) injection, meperidine (DEMEROL) injection, midazolam, ondansetron **OR** ondansetron (ZOFRAN) IV, promethazine, promethazine, senna-docusate, zolpidem  Assessment/Plan: Doing well s/p revision bilateral sinus surgery and Draf 3 frontal sinusotomy. Doing well, can change drip pad as needed. Will monitor overnight and likely home in the morning. Packing will need to stay  in at least 7 to 10 days.     Beverly Lawrence, Beverly Lawrence 05/15/2015, 4:38 PM

## 2015-05-15 NOTE — Op Note (Signed)
4:06 PM  05/15/2015  Surgeon: Melvenia BeamMitchell Shereece Wellborn MD PhD  Procedures Performed: [id number] bilateral total ethmoidectomies [phone] [id number] bilateral maxillary antrostomies with tissue removal 31276-50 bilateral Draf 3 frontal sinusotomies 31288-50 bilateral sphenoidotomies with tissue removal  Operative Findings: large preexisting septal perforation communicated up into the Draf 3 frontal sinusotomy/superior septectomy. Copious purulence, chronic sinusitis with mucosal inflammation in all 8 sinuses, and left frontoethmoid mucopyocele ("Pott's puffy tumor") widely opened and communicated into the Draf III frontal sinusotomy. No CSF leak, no orbital or skull base injury, no injury to the anterior or posterior ethmoid arteries.  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  ESTIMATED BLOOD LOSS: less than 250 mL.  HISTORY OF PRESENT ILLNESS: The patient is a  69yo female who has had left frontoethmoid mucopyocele with left eyelid edema/cellulitis/abscess requiring needle drainage x 2 with bilateral chronic pansinusitis refractory to maximal medical treatment. After discussing the risks, benefits, and alternatives of endoscopic sinus surgery the patient was taken to the OR today for definitive endoscopic sinus surgery and Draf 3 frontal sinusotomies to address the left frontal mucoceles.  PROCEDURE: The patient was brought to the operating room and placed in the supine position. After adequate endotracheal anesthesia was obtained, the skin was draped in sterile fashion. Lidocaine 1% with 1:100,000 epinephrine was injected into the bilateral greater palatine foramina transorally. Approximately 10 cc total was used. The most recent head CT was done with gantry tilt so could ot be used for image  guidance.   Attention then was directed toward the left sinuses. Lidocaine 1% with 1:100,000 epinephrine was injected in the region of the anterior portion of the left middle turbinate and uncinate process. The residual uncinate process was identified using the 0 degree endoscope and the left uncinate process was  removed systematically superiorly to inferiorly with back-biting forceps. Next, the left maxillary sinus was identified and the medial wall of the left maxillary sinus was widely opened using the microdebrider. I then switched to the 45 degree scope and inspected the maxillary sinus. The left maxillary sinus natural ostium was identified and I took the inferior left medial maxillary sinus wall down to the superior aspect of the inferior turbinate using the Stammberger downbiter and irrigated out the copious left maxillary purulence. The natural ostium was widely opened using the backbiter.  The anterior and posterior ethmoid air cells were entered after identifying the left ethmoid bulla using the suction and dissected up to the skull base and out to the lamina papyrecea using the 55 degree curette and the 3mm Kerrison punch. All of the ethmoid cells were meticulously dissected out using the Kerrison and debrider. The skull base and lamina papyracea were preserved throughout.   The left sphenoid natural ostium was then identified using the suction and the left sphenoid was widely opened using the 3mm Kerrison all the way up to the skull base and lamina papyracea. The sphenoid was widely opened using the 3mm Kerrison.  Next I switched back to the 45 degree endoscope, 60 degree microdebrider, and curved image guidance suction. The skull base was identified and dissected anteriorly using the 90 degree Kuhn-Bolger curette. The Agger Nasi cell was taken down and then the left frontal sinus was widely opened using the curved debrider, Stammberger 4mm frontal mushroom punch, and the 90 degree curette.  The superior/anterior aspect of the left middle turbinate was  then removed along with the superior aspect of the nasal septum to convert this to a Draf 2B frontal sinusotomy.  Attention then was directed toward the right sinuses. Lidocaine 1% with 1:100,000 epinephrine was injected in the region of the anterior portion of the left middle turbinate and uncinate process. The uncinate process was identified using the 0 degree endoscope and the right uncinate process was  removed systematically superiorly to inferiorly with back-biting forceps. The right middle turbinate was medialized and the superior portion of the right middle turbinate was removed using the side to side Kuhn thru-cuts. Next, the right maxillary sinus was identified and the medial wall of the right maxillary sinus was widely opened using the microdebrider. I then switched to the 45 degree scope and inspected the maxillary sinus. The right maxillary sinus was meticulously opened sing the curved suction and debrider, 55 degree curette, and 45 degree Blakesley forceps.  I meticulously removed the purulence inside the  right maxillary sinus and widely communicated the right maxillary sinus with the right maxillary natural ostium. I then switched back to the zero degree scope and straight microdebrider.  The right anterior and posterior ethmoid air cells were entered after identifying the right ethmoid bulla using the suction and dissected up to the skull base and out to the lamina papyrecea using the 55 degree curette and the 3mm Kerrison punch. All of the ethmoid cells were meticulously dissected out using the Kerrison and debrider. Copious ethmoid polyposis and inspissated mucous was encountered and meticulously removed using the microdebrider. The skull base and lamina papyracea were preserved throughout.   The right sphenoid natural ostium was then identified using the suction and the right sphenoid was widely opened using the 3mm Kerrison all  the way to the skull base and lamina papyracea.  Next I switched back to the 45 degree endoscope, 60 degree microdebrider, and curved suction. The skull base was identified and dissected anteriorly using the 90 degree Kuhn-Bolger currette. The Agger Nasi cell was taken down and then the right frontal sinus was widely opened using the curved debrider, Stammberger 4mm frontal mushroom punch, and the 90 degree currette. The superior portion of the right middle turbinate was removed using the side-to-side Kuhn frontal thru-cut. I then used the 60 degree microdebrider to remove the cartilaginous portion of the superior septum, and then I used the 45 degree scope and the 70 degree 4mm diamond drill bit to remove the entire frontal intersinus septum from the anterior to the posterior table, and I used the drill to take the septectomy all the way superiorly to the superior aspect of the frontal sinus, thus completing the Draf 3/modified endoscopic [place] frontal sinusotomies. By palpation I confirmed that the Draf 3 frontal had widely opened the left frontoethmoid mucopyocele cavity and all frontal purlence was drained and irrigated out.  A thorough irrigation was then carried out in the nasal cavity, I bolgerized the middle turbinates to the remaining septum posterior to the septal perforation, packed the Draf 3 frontal cavity with 1/2 inch iodoform gauze, and bilateral 8cm fingercot merocele spacers were placed in the  middle meatal cavities ad secured to the patient's cheeks. The patient's stomach was suctioned out using a flexible OGtube. The patient was awakened from anesthesia and extubated without difficulty. The patient tolerated the procedure well and returned to the recovery room in stable condition.   Dr. Melvenia Beam was present and performed the entire procedure. 4:06 PM  05/15/2015 Melvenia Beam

## 2015-05-15 NOTE — Anesthesia Preprocedure Evaluation (Addendum)
Anesthesia Evaluation  Patient identified by MRN, date of birth, ID band Patient awake    Reviewed: Allergy & Precautions, NPO status , Patient's Chart, lab work & pertinent test results  History of Anesthesia Complications (+) PONV and history of anesthetic complications  Airway Mallampati: II  TM Distance: >3 FB Neck ROM: Full  Mouth opening: Limited Mouth Opening  Dental  (+) Edentulous Upper, Poor Dentition, Missing, Chipped, Dental Advisory Given,    Pulmonary COPD,  COPD inhaler, former smoker (quit 2006),    breath sounds clear to auscultation       Cardiovascular hypertension, Pt. on home beta blockers and Pt. on medications + CAD and + Past MI (NSTEMI)  + Valvular Problems/Murmurs MR and AI  Rhythm:Regular Rate:Normal + Systolic murmurs 05/13/1409/6/16 Nuclear stress test ([facility], see Care Everywhere): IMPRESSION: 1. No evidence of inducible myocardial ischemia. Small fixed perfusion defect of the mid to distal anteroseptal wall may reflect a prior small infarction. 2. Normal left ventricular wall motion. 3. Left ventricular ejection fraction 68% 4. Low-risk stress test findings*  10/16 ECHO: normal LVF, moderate MR/AR, moderate pulm HTN          Neuro/Psych Anxiety TIA   GI/Hepatic Neg liver ROS, GERD  Medicated and Controlled,  Endo/Other  negative endocrine ROS  Renal/GU negative Renal ROS     Musculoskeletal  (+) Arthritis , Osteoarthritis,    Abdominal   Peds  Hematology  (+) Blood dyscrasia (autoimmune hemolytic anemai, Hb 10.7), ,   Anesthesia Other Findings   Reproductive/Obstetrics                       Anesthesia Physical Anesthesia Plan  ASA: III  Anesthesia Plan: General   Post-op Pain Management:    Induction: Intravenous  Airway Management Planned: Oral ETT  Additional Equipment:   Intra-op Plan:   Post-operative Plan: Extubation in OR  Informed Consent: I have  reviewed the patients History and Physical, chart, labs and discussed the procedure including the risks, benefits and alternatives for the proposed anesthesia with the patient or authorized representative who has indicated his/her understanding and acceptance.   Dental advisory given  Plan Discussed with: CRNA and Surgeon  Anesthesia Plan Comments: (Plan routine monitors, GETA)        Anesthesia Quick Evaluation

## 2015-05-15 NOTE — Transfer of Care (Signed)
Immediate Anesthesia Transfer of Care Note  Patient: Dahlia BailiffRita M Hume  Procedure(s) Performed: Procedure(s): ENDOSCOPIC SINUS SURGERY (Bilateral)  Patient Location: PACU  Anesthesia Type:General  Level of Consciousness: awake, alert , oriented and patient cooperative  Airway & Oxygen Therapy: Patient Spontanous Breathing and Patient connected to face mask oxygen  Post-op Assessment: Report given to RN and Post -op Vital signs reviewed and stable  Post vital signs: Reviewed and stable  Last Vitals:  Filed Vitals:   05/15/15 1052  BP: 150/74  Pulse: 57  Temp: 36.8 C  Resp: 18    Complications: No apparent anesthesia complications

## 2015-05-15 NOTE — Anesthesia Postprocedure Evaluation (Signed)
  Anesthesia Post-op Note  Patient: Beverly Lawrence  Procedure(s) Performed: Procedure(s): ENDOSCOPIC SINUS SURGERY (Bilateral)  Patient Location: PACU  Anesthesia Type:General  Level of Consciousness: awake, alert , oriented and patient cooperative  Airway and Oxygen Therapy: Patient Spontanous Breathing  Post-op Pain: mild  Post-op Assessment: Post-op Vital signs reviewed, Patient's Cardiovascular Status Stable, Respiratory Function Stable, Patent Airway, No signs of Nausea or vomiting and Pain level controlled              Post-op Vital Signs: Reviewed and stable  Last Vitals:  Filed Vitals:   05/15/15 1700  BP: 109/70  Pulse: 60  Temp:   Resp: 12    Complications: No apparent anesthesia complications

## 2015-05-15 NOTE — Anesthesia Procedure Notes (Signed)
Procedure Name: Intubation Date/Time: 05/15/2015 1:18 PM Performed by: Faustino CongressWHITE, Lauralie Blacksher TENA Kalden Wanke Pre-anesthesia Checklist: Patient identified, Emergency Drugs available, Suction available and Patient being monitored Patient Re-evaluated:Patient Re-evaluated prior to inductionOxygen Delivery Method: Circle system utilized Preoxygenation: Pre-oxygenation with 100% oxygen Intubation Type: IV induction Ventilation: Mask ventilation without difficulty and Oral airway inserted - appropriate to patient size Laryngoscope Size: Miller and 2 Grade View: Grade I Tube type: Oral Tube size: 7.0 mm Number of attempts: 1 Airway Equipment and Method: Stylet Placement Confirmation: ETT inserted through vocal cords under direct vision,  positive ETCO2 and breath sounds checked- equal and bilateral Secured at: 22 cm Tube secured with: Tape Dental Injury: Teeth and Oropharynx as per pre-operative assessment

## 2015-05-15 NOTE — Discharge Instructions (Addendum)
No nose blowing or strenuous activity/heavy lifting  x 2 weeks. Regular diet, up ad lib. Leave nasal packing in place. May change nasal drip pad (4x4 gauze and paper tape) as needed. Follow up with Dr. Emeline DarlingGore in one week. Rx for hydrocodone/acetaminophen given to family. Rx for antibiotic and zofran sent to pharmacy. May alternate your hydrocodone with OTC ibuprofen. Take an OTC iron supplement as directed at home to help increase your hemoglobin/hematocrit while you are healing. Apply OTC neosporin to your left upper eyelid I&D site TID.

## 2015-05-16 DIAGNOSIS — J324 Chronic pansinusitis: Secondary | ICD-10-CM | POA: Diagnosis not present

## 2015-05-16 LAB — CBC
HEMATOCRIT: 24.5 % — AB (ref 36.0–46.0)
Hemoglobin: 8.3 g/dL — ABNORMAL LOW (ref 12.0–15.0)
MCH: 36.4 pg — AB (ref 26.0–34.0)
MCHC: 33.9 g/dL (ref 30.0–36.0)
MCV: 107.5 fL — ABNORMAL HIGH (ref 78.0–100.0)
Platelets: 190 10*3/uL (ref 150–400)
RBC: 2.28 MIL/uL — ABNORMAL LOW (ref 3.87–5.11)
RDW: 13.7 % (ref 11.5–15.5)
WBC: 3.7 10*3/uL — ABNORMAL LOW (ref 4.0–10.5)

## 2015-05-16 LAB — COMPREHENSIVE METABOLIC PANEL
ALK PHOS: 48 U/L (ref 38–126)
ALT: 8 U/L — AB (ref 14–54)
AST: 11 U/L — ABNORMAL LOW (ref 15–41)
Albumin: 3.2 g/dL — ABNORMAL LOW (ref 3.5–5.0)
Anion gap: 5 (ref 5–15)
BILIRUBIN TOTAL: 0.6 mg/dL (ref 0.3–1.2)
BUN: 11 mg/dL (ref 6–20)
CALCIUM: 8.7 mg/dL — AB (ref 8.9–10.3)
CO2: 24 mmol/L (ref 22–32)
CREATININE: 0.75 mg/dL (ref 0.44–1.00)
Chloride: 103 mmol/L (ref 101–111)
Glucose, Bld: 116 mg/dL — ABNORMAL HIGH (ref 65–99)
Potassium: 5 mmol/L (ref 3.5–5.1)
Sodium: 132 mmol/L — ABNORMAL LOW (ref 135–145)
TOTAL PROTEIN: 6 g/dL — AB (ref 6.5–8.1)

## 2015-05-16 LAB — PHOSPHORUS: PHOSPHORUS: 4 mg/dL (ref 2.5–4.6)

## 2015-05-16 LAB — MAGNESIUM: MAGNESIUM: 1.6 mg/dL — AB (ref 1.7–2.4)

## 2015-05-16 MED ORDER — ONDANSETRON 4 MG PO TBDP: 4.0000 mg | ORAL_TABLET | Freq: Four times a day (QID) | ORAL | Status: AC | PRN

## 2015-05-16 MED ORDER — HYDROCODONE-ACETAMINOPHEN 5-325 MG PO TABS: 1.0000 | ORAL_TABLET | ORAL | Status: DC | PRN

## 2015-05-16 NOTE — Progress Notes (Signed)
Discussed discharge summary with patient. Reviewed all medications with patient. Patient ready for discharge.  

## 2015-05-16 NOTE — Discharge Summary (Signed)
05/16/2015  8:50 AM  Date of Admission: 05/15/2015 Date of Discharge: 05/16/2015  Discharge MD: Melvenia BeamGore, Perrin Gens, MD  Admitting MD: Melvenia BeamGore, Giomar Gusler, MD  Reason for admission/final discharge diagnosis: left frontal/ethmoid mucopyocele with chronic pansinusitis  Labs: see EPIC, H/H trended down from ~11/33 pre-op to ~ 8.4/24 post-op but patient hemodynamically stable and vitals stable so no transfusion needed. Sodium stable at 132.  Procedure(s) performed: revision bilateral functional endoscopic sinus surgery with Draf 3 frontal sinusotomy 05/16/15  Discharge Condition: good  Discharge Exam: EOMI, PERRLA, left medial periorbital edema and erythema nearly resolved and I&D sites healing well. Nose hemostatic with nasal packing in place and drip pad on nose clean dry and intact. Cn 2-12 intact and symmetric, NAD.  Discharge Instructions: No heavy lifting x 2 weeks, no nose blowing x 2 weeks, [facility]. Take an OTC iron supplement as directed at home to increase your hemoglobin and hematocrit, Rx for hydrocodone/acetaminophen given to family, may alternate the hydrocodone with OTC ibuprofen PRN pain, Rx for zofran and rifampin sent to pharmacy. Regular diet, up ad lib.  Hospital Course: did well after revision bilateral sinus surgery. Patient did well post-op, hemoglobin/hematocrit trended down somewhat as expected post-op but patient hemodynamically stable. Nose hemostatic and patient discharged POD#1 in good condition.  Melvenia BeamGore, Nazareth Norenberg 8:50 AM 05/16/2015

## 2015-05-19 ENCOUNTER — Encounter (HOSPITAL_COMMUNITY): Payer: Self-pay | Admitting: Otolaryngology

## 2015-05-19 LAB — TISSUE CULTURE
CULTURE: NO GROWTH
CULTURE: NO GROWTH

## 2015-05-20 LAB — ANAEROBIC CULTURE

## 2017-08-19 ENCOUNTER — Other Ambulatory Visit: Payer: Self-pay

## 2017-08-19 ENCOUNTER — Encounter (HOSPITAL_BASED_OUTPATIENT_CLINIC_OR_DEPARTMENT_OTHER): Payer: Self-pay

## 2017-08-19 ENCOUNTER — Inpatient Hospital Stay (HOSPITAL_BASED_OUTPATIENT_CLINIC_OR_DEPARTMENT_OTHER)
Admission: EM | Admit: 2017-08-19 | Discharge: 2017-08-21 | DRG: 392 | Disposition: A | Discharge status: Home Health | Payer: Medicare Other | Attending: Internal Medicine | Admitting: Internal Medicine

## 2017-08-19 ENCOUNTER — Emergency Department (HOSPITAL_BASED_OUTPATIENT_CLINIC_OR_DEPARTMENT_OTHER): Payer: Medicare Other

## 2017-08-19 DIAGNOSIS — I10 Essential (primary) hypertension: Secondary | ICD-10-CM | POA: Diagnosis not present

## 2017-08-19 DIAGNOSIS — Z87891 Personal history of nicotine dependence: Secondary | ICD-10-CM | POA: Diagnosis not present

## 2017-08-19 DIAGNOSIS — Z86718 Personal history of other venous thrombosis and embolism: Secondary | ICD-10-CM

## 2017-08-19 DIAGNOSIS — R1084 Generalized abdominal pain: Secondary | ICD-10-CM

## 2017-08-19 DIAGNOSIS — Z7952 Long term (current) use of systemic steroids: Secondary | ICD-10-CM | POA: Diagnosis not present

## 2017-08-19 DIAGNOSIS — K219 Gastro-esophageal reflux disease without esophagitis: Secondary | ICD-10-CM | POA: Diagnosis present

## 2017-08-19 DIAGNOSIS — K562 Volvulus: Secondary | ICD-10-CM

## 2017-08-19 DIAGNOSIS — D591 Autoimmune hemolytic anemia, unspecified: Secondary | ICD-10-CM | POA: Diagnosis present

## 2017-08-19 DIAGNOSIS — Z7901 Long term (current) use of anticoagulants: Secondary | ICD-10-CM

## 2017-08-19 DIAGNOSIS — I110 Hypertensive heart disease with heart failure: Secondary | ICD-10-CM | POA: Diagnosis present

## 2017-08-19 DIAGNOSIS — Z885 Allergy status to narcotic agent status: Secondary | ICD-10-CM

## 2017-08-19 DIAGNOSIS — I5032 Chronic diastolic (congestive) heart failure: Secondary | ICD-10-CM | POA: Diagnosis present

## 2017-08-19 DIAGNOSIS — E876 Hypokalemia: Secondary | ICD-10-CM

## 2017-08-19 DIAGNOSIS — Z79899 Other long term (current) drug therapy: Secondary | ICD-10-CM | POA: Diagnosis not present

## 2017-08-19 DIAGNOSIS — Z888 Allergy status to other drugs, medicaments and biological substances status: Secondary | ICD-10-CM

## 2017-08-19 DIAGNOSIS — Z8673 Personal history of transient ischemic attack (TIA), and cerebral infarction without residual deficits: Secondary | ICD-10-CM | POA: Diagnosis not present

## 2017-08-19 DIAGNOSIS — R110 Nausea: Secondary | ICD-10-CM | POA: Diagnosis not present

## 2017-08-19 DIAGNOSIS — I82409 Acute embolism and thrombosis of unspecified deep veins of unspecified lower extremity: Secondary | ICD-10-CM | POA: Diagnosis present

## 2017-08-19 DIAGNOSIS — Z880 Allergy status to penicillin: Secondary | ICD-10-CM

## 2017-08-19 DIAGNOSIS — K529 Noninfective gastroenteritis and colitis, unspecified: Principal | ICD-10-CM | POA: Diagnosis present

## 2017-08-19 DIAGNOSIS — R109 Unspecified abdominal pain: Secondary | ICD-10-CM | POA: Diagnosis present

## 2017-08-19 DIAGNOSIS — I82401 Acute embolism and thrombosis of unspecified deep veins of right lower extremity: Secondary | ICD-10-CM | POA: Diagnosis not present

## 2017-08-19 HISTORY — DX: Other autoimmune hemolytic anemias: D59.1

## 2017-08-19 HISTORY — DX: Acute embolism and thrombosis of unspecified deep veins of unspecified lower extremity: I82.409

## 2017-08-19 HISTORY — DX: Hypo-osmolality and hyponatremia: E87.1

## 2017-08-19 HISTORY — DX: Unspecified intestinal obstruction, unspecified as to partial versus complete obstruction: K56.609

## 2017-08-19 LAB — URINALYSIS, ROUTINE W REFLEX MICROSCOPIC
Bilirubin Urine: NEGATIVE
Glucose, UA: NEGATIVE mg/dL
Hgb urine dipstick: NEGATIVE
Ketones, ur: NEGATIVE mg/dL
Leukocytes, UA: NEGATIVE
NITRITE: NEGATIVE
PROTEIN: NEGATIVE mg/dL
pH: 6 (ref 5.0–8.0)

## 2017-08-19 LAB — CBC
HCT: 23.8 % — ABNORMAL LOW (ref 36.0–46.0)
HEMOGLOBIN: 8 g/dL — AB (ref 12.0–15.0)
MCH: 34.3 pg — ABNORMAL HIGH (ref 26.0–34.0)
MCHC: 33.6 g/dL (ref 30.0–36.0)
MCV: 102.1 fL — ABNORMAL HIGH (ref 78.0–100.0)
PLATELETS: 205 10*3/uL (ref 150–400)
RBC: 2.33 MIL/uL — AB (ref 3.87–5.11)
RDW: 17.7 % — ABNORMAL HIGH (ref 11.5–15.5)
WBC: 6.4 10*3/uL (ref 4.0–10.5)

## 2017-08-19 LAB — COMPREHENSIVE METABOLIC PANEL
ALK PHOS: 70 U/L (ref 38–126)
ALT: 21 U/L (ref 14–54)
ANION GAP: 11 (ref 5–15)
AST: 23 U/L (ref 15–41)
Albumin: 3.5 g/dL (ref 3.5–5.0)
BILIRUBIN TOTAL: 2 mg/dL — AB (ref 0.3–1.2)
BUN: 19 mg/dL (ref 6–20)
CO2: 31 mmol/L (ref 22–32)
CREATININE: 0.71 mg/dL (ref 0.44–1.00)
Calcium: 8.2 mg/dL — ABNORMAL LOW (ref 8.9–10.3)
Chloride: 92 mmol/L — ABNORMAL LOW (ref 101–111)
Glucose, Bld: 117 mg/dL — ABNORMAL HIGH (ref 65–99)
Potassium: 3 mmol/L — ABNORMAL LOW (ref 3.5–5.1)
SODIUM: 134 mmol/L — AB (ref 135–145)
TOTAL PROTEIN: 6.1 g/dL — AB (ref 6.5–8.1)

## 2017-08-19 LAB — PROTIME-INR
INR: 0.91
PROTHROMBIN TIME: 12.2 s (ref 11.4–15.2)

## 2017-08-19 LAB — LIPASE, BLOOD: Lipase: 23 U/L (ref 11–51)

## 2017-08-19 LAB — I-STAT CG4 LACTIC ACID, ED: Lactic Acid, Venous: 1.58 mmol/L (ref 0.5–1.9)

## 2017-08-19 MED ORDER — POTASSIUM CHLORIDE CRYS ER 20 MEQ PO TBCR
40.0000 meq | EXTENDED_RELEASE_TABLET | Freq: Once | ORAL | Status: AC
  Administered 2017-08-19: 40 meq via ORAL
  Filled 2017-08-19: qty 2

## 2017-08-19 MED ORDER — FOLIC ACID 1 MG PO TABS
1.0000 mg | ORAL_TABLET | Freq: Two times a day (BID) | ORAL | Status: DC
  Administered 2017-08-20 – 2017-08-21 (×3): 1 mg via ORAL
  Filled 2017-08-19 (×3): qty 1

## 2017-08-19 MED ORDER — IOPAMIDOL (ISOVUE-300) INJECTION 61%
100.0000 mL | Freq: Once | INTRAVENOUS | Status: AC | PRN
  Administered 2017-08-19: 100 mL via INTRAVENOUS

## 2017-08-19 MED ORDER — METRONIDAZOLE IN NACL 5-0.79 MG/ML-% IV SOLN
500.0000 mg | Freq: Three times a day (TID) | INTRAVENOUS | Status: DC
Start: 2017-08-19 — End: 2017-08-21
  Administered 2017-08-20 – 2017-08-21 (×5): 500 mg via INTRAVENOUS
  Filled 2017-08-19 (×7): qty 100

## 2017-08-19 MED ORDER — ZOLPIDEM TARTRATE 5 MG PO TABS: 5.0000 mg | ORAL_TABLET | Freq: Every evening | ORAL | Status: DC | PRN

## 2017-08-19 MED ORDER — LABETALOL HCL 100 MG PO TABS
100.0000 mg | ORAL_TABLET | Freq: Two times a day (BID) | ORAL | Status: DC
  Administered 2017-08-20 – 2017-08-21 (×3): 100 mg via ORAL
  Filled 2017-08-19 (×3): qty 1

## 2017-08-19 MED ORDER — DILTIAZEM HCL ER COATED BEADS 180 MG PO CP24
180.0000 mg | ORAL_CAPSULE | Freq: Every day | ORAL | Status: DC
  Administered 2017-08-20 – 2017-08-21 (×2): 180 mg via ORAL
  Filled 2017-08-19 (×2): qty 1

## 2017-08-19 MED ORDER — PANTOPRAZOLE SODIUM 40 MG IV SOLR
40.0000 mg | Freq: Two times a day (BID) | INTRAVENOUS | Status: DC
  Administered 2017-08-20 – 2017-08-21 (×3): 40 mg via INTRAVENOUS
  Filled 2017-08-19 (×3): qty 40

## 2017-08-19 MED ORDER — AMLODIPINE BESYLATE 10 MG PO TABS
10.0000 mg | ORAL_TABLET | Freq: Every day | ORAL | Status: DC
  Administered 2017-08-20 – 2017-08-21 (×2): 10 mg via ORAL
  Filled 2017-08-19 (×2): qty 1

## 2017-08-19 MED ORDER — FENTANYL CITRATE (PF) 100 MCG/2ML IJ SOLN
50.0000 ug | Freq: Once | INTRAMUSCULAR | Status: AC
  Administered 2017-08-19: 50 ug via INTRAVENOUS
  Filled 2017-08-19: qty 2

## 2017-08-19 MED ORDER — OXYBUTYNIN CHLORIDE ER 5 MG PO TB24
5.0000 mg | ORAL_TABLET | Freq: Every day | ORAL | Status: DC
  Administered 2017-08-20: 5 mg via ORAL
  Filled 2017-08-19: qty 1

## 2017-08-19 MED ORDER — HYDROMORPHONE HCL 1 MG/ML IJ SOLN
1.0000 mg | INTRAMUSCULAR | Status: DC | PRN
  Administered 2017-08-20 – 2017-08-21 (×3): 1 mg via INTRAVENOUS
  Filled 2017-08-19 (×3): qty 1

## 2017-08-19 MED ORDER — DEXTROSE-NACL 5-0.45 % IV SOLN
INTRAVENOUS | Status: DC
  Administered 2017-08-20: via INTRAVENOUS

## 2017-08-19 MED ORDER — METHYLPREDNISOLONE SODIUM SUCC 125 MG IJ SOLR
60.0000 mg | Freq: Two times a day (BID) | INTRAMUSCULAR | Status: DC
  Administered 2017-08-20 – 2017-08-21 (×3): 60 mg via INTRAVENOUS
  Filled 2017-08-19 (×3): qty 2

## 2017-08-19 NOTE — ED Notes (Signed)
Alert, NAD, calm, interactive, resps e/u, no dyspnea noted, VSS. [facility]

## 2017-08-19 NOTE — ED Triage Notes (Signed)
Pt c/o abd bloating and diarrhea x 5 days- bruising to both legs 3 days ago-[facility]-was advised by her He//Oc MD to come to ED today [facility]-pt presents to triage in w/c-NAD

## 2017-08-19 NOTE — ED Notes (Signed)
Pt husband repeatedly comes to the desk wanting to get a bed assignment immediately, wanting to speak with the doctor immediately and upset because they are having to wait. This nurse explained admission procedures to him and the pt. Comfort measures given. EDP made aware.

## 2017-08-19 NOTE — ED Notes (Signed)
Report given to [facility]. Pt up to b/r, steady gait with walker, family present.

## 2017-08-19 NOTE — ED Notes (Signed)
ED Provider at bedside. 

## 2017-08-19 NOTE — ED Notes (Signed)
Pt tx in Dec with abx for stomach issues back in December. Pt reports she has had 4-5 episodes of watery diarrhea per day x 5 days. Pt abd is distended. Denies vomiting. Pt reports that she took a stool sample to PCP office this am for c diff test. Pt also reports large bruise to L inner thigh. Pt takes eliquis and prednisone. Pt seen at [facility] on Wednesday for same issues.

## 2017-08-19 NOTE — ED Provider Notes (Signed)
[facility] EMERGENCY DEPARTMENT Provider Note   CSN: [id number] Arrival date & time: 08/19/17  1120     History   Chief Complaint Chief Complaint  Patient presents with  . Abdominal Pain    HPI Beverly Lawrence is a 72 y.o. female.  The history is provided by the patient. No language interpreter was used.  Abdominal Pain     Beverly Lawrence is a 72 y.o. female who presents to the Emergency Department complaining of abdominal pain, diarrhea.  Abdominal pain started Tuesday.  Pain is generalized and constant in nature.  Diarrhea began shortly after abdominal pain began, 4-5 episodes daily, orange to brown in color.  She had associated vomiting today.  Denies fevers, dysuria, chest pain, sob.  No recent antibiotics.  She endorses associated progressive abdominal bloating.  She also reports ecchymosis to the left leg since Tuesday - no new injuries or pain.   Past Medical History:  Diagnosis Date  . Anemia    WARM AUTOIMMUNE HEMOLYTIC ANEMIA  (diagnosed 11/2014; DR. Barbara Cower [facility])  . Anxiety   . Arthritis    currently being seen by rheumatologist for confirmed diagnosing  . Autoimmune hemolytic anemia (HCC)   . CHF (congestive heart failure) (HCC)    diastolic CHF  . Diverticulitis of colon 1994   with colostomy placed and later reversed  . Dysrhythmia    patient states that she has a "premature beat"  . GERD (gastroesophageal reflux disease)   . Headache   . Heart murmur    moderate MR/AR, moderate pulm HTN 04/2015 echo  . 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 Hx: UTI (urinary tract infection)   . Hypertension   . Hyperthyroidism   . Hyponatremia   . Peritonitis (HCC) 1977   hx of  . PONV (postoperative nausea and vomiting)   . Small bowel obstruction [facility])     Patient Active Problem List   Diagnosis Date Noted  . 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 Chronic sinusitis 05/15/2015    Past Surgical History:  Procedure Laterality  Date  . APPENDECTOMY  1966  . COLON SURGERY     colostomy placed in 1994 and reversed in 1995  . EYE SURGERY Bilateral 2016   cataracts  . LEG SURGERY Bilateral 2011   vein ablation in both legs per patient  . NASAL SINUS SURGERY Bilateral 05/15/2015   Procedure: ENDOSCOPIC SINUS SURGERY;  Surgeon: Melvenia Beam, MD;  Location: [facility] OR;  Service: ENT;  Laterality: Bilateral;  . OVARIAN CYST REMOVAL Left 1966  . pilonidal cyst  1972  . SINUS ENDO W/FUSION Bilateral 12/20/2014   Procedure: BILATERAL ENDOSCOPIC SINUS SURGERY WITH NAVIGATION/BALLOON DILATION;  Surgeon: Melvenia Beam, MD;  Location: [facility] OR;  Service: ENT;  Laterality: Bilateral;  . small bowel obs surgery    . VITRECTOMY Left 2015    OB History    No data available       Home Medications    Prior to Admission medications   Medication Sig Start Date End Date Taking? Authorizing Provider  Apixaban (ELIQUIS PO) Take by mouth.   Yes [provider]  diltiazem (DILACOR XR) 180 MG 24 hr capsule Take 180 mg by mouth daily.   Yes [provider]  Furosemide (LASIX PO) Take by mouth.   Yes [provider]  Potassium Chloride (KLOR-CON PO) Take by mouth.   Yes [provider]  PREDNISONE PO  Take by mouth.   Yes [provider]  amLODipine (NORVASC) 10 MG tablet Take 10 mg by mouth daily.    [provider]  atorvastatin (LIPITOR) 40 MG tablet Take 40 mg by mouth daily at 6 PM.    [provider]  ciprofloxacin (CILOXAN) 0.3 % ophthalmic solution Place 1 drop into the left eye every 4 (four) hours while awake. Started 10/31    [provider]  folic acid (FOLVITE) 1 MG tablet Take 1 mg by mouth 2 (two) times daily. 11/22/14   [provider]  HYDROcodone-acetaminophen (NORCO/VICODIN) 5-325 MG tablet Take 1-2 tablets by mouth every 4 (four) hours as needed for moderate pain or severe pain. 05/16/15   Melvenia BeamGore, Mitchell, MD  labetalol (NORMODYNE) 100 MG  tablet Take 100 mg by mouth 2 (two) times daily.    [provider]  metoprolol tartrate (LOPRESSOR) 25 MG tablet Take 25 mg by mouth 2 (two) times daily with a meal.     [provider]  ondansetron (ZOFRAN-ODT) 4 MG disintegrating tablet Take 1 tablet (4 mg total) by mouth every 6 (six) hours as needed for nausea, vomiting or refractory nausea / vomiting. 05/16/15   Melvenia BeamGore, Mitchell, MD  oxybutynin (DITROPAN-XL) 5 MG 24 hr tablet Take 5 mg by mouth at bedtime.    [provider]  pantoprazole (PROTONIX) 40 MG tablet Take 40 mg by mouth 2 (two) times daily.  11/28/14   [provider]  promethazine (PHENERGAN) 12.5 MG tablet Take 12.5 mg by mouth every 6 (six) hours as needed for nausea or vomiting.    [provider]  sodium chloride 1 G tablet Take 1 g by mouth 3 (three) times daily with meals.    [provider]  thiamine (VITAMIN B-1) 100 MG tablet Take 100 mg by mouth daily.    [provider]  zolpidem (AMBIEN) 5 MG tablet Take 5 mg by mouth at bedtime as needed for sleep.  12/12/14   [provider]    Family History No family history on file.  Social History Social History   Tobacco Use  . Smoking status: Former Smoker    Last attempt to quit: 07/05/2004    Years since quitting: 13.1  . Smokeless tobacco: Never Used  Substance Use Topics  . Alcohol use: No    Frequency: Never  . Drug use: No     Allergies   Lisinopril; Codeine; Erythromycin; Morphine and related; Penicillins; Percocet [oxycodone-acetaminophen]; and Percodan [oxycodone-aspirin]   Review of Systems Review of Systems  Gastrointestinal: Positive for abdominal pain.  All other systems reviewed and are negative.    Physical Exam Updated Vital Signs BP 114/61 (BP Location: Right Arm)   Pulse (!) 55   Temp 97.9 F (36.6 C)   Resp 20   Ht 5\' 3"  (1.6 m)   Wt 55.3 kg (122 lb)   SpO2 100%   BMI 21.61 kg/m   Physical Exam    Constitutional: She is oriented to person, place, and time. She appears well-developed and well-nourished.  HENT:  Head: Normocephalic and atraumatic.  Cardiovascular: Normal rate and regular rhythm.  No murmur heard. Pulmonary/Chest: Effort normal and breath sounds normal. No respiratory distress.  Abdominal:  Distended abdomen, tympanic to percussion, moderate diffuse tenderness, no guarding or rebound tenderness  Musculoskeletal:  2-3 + pitting edema to RLE.  1-2+ pitting edema to LLE.  Ecchymosis to left thigh/calf.  2+ DP pulses bilaterally.    Neurological: She  is alert and oriented to person, place, and time.  Skin: Skin is warm and dry.  Psychiatric: She has a normal mood and affect. Her behavior is normal.  Nursing note and vitals reviewed.    ED Treatments / Results  Labs (all labs ordered are listed, but only abnormal results are displayed) Labs Reviewed  COMPREHENSIVE METABOLIC PANEL - Abnormal; Notable for the following components:      Result Value   Sodium 134 (*)    Potassium 3.0 (*)    Chloride 92 (*)    Glucose, Bld 117 (*)    Calcium 8.2 (*)    Total Protein 6.1 (*)    Total Bilirubin 2.0 (*)    All other components within normal limits  CBC - Abnormal; Notable for the following components:   RBC 2.33 (*)    Hemoglobin 8.0 (*)    HCT 23.8 (*)    MCV 102.1 (*)    MCH 34.3 (*)    RDW 17.7 (*)    All other components within normal limits  URINALYSIS, ROUTINE W REFLEX MICROSCOPIC - Abnormal; Notable for the following components:   Specific Gravity, Urine <1.005 (*)    All other components within normal limits  GASTROINTESTINAL PANEL BY PCR, STOOL (REPLACES STOOL CULTURE)  C DIFFICILE QUICK SCREEN W PCR REFLEX  LIPASE, BLOOD  PROTIME-INR  I-STAT CG4 LACTIC ACID, ED  I-STAT CG4 LACTIC ACID, ED    EKG  EKG Interpretation None       Radiology Ct Abdomen Pelvis W Contrast  Result Date: 08/19/2017 CLINICAL DATA:  Hemolytic anemia.  Abdominal  distension for 4 days. EXAM: CT ABDOMEN AND PELVIS WITH CONTRAST TECHNIQUE: Multidetector CT imaging of the abdomen and pelvis was performed using the standard protocol following bolus administration of intravenous contrast. CONTRAST:  ISOVUE-300 IOPAMIDOL (ISOVUE-300) INJECTION 61% COMPARISON:  CT scan 08/17/2017 FINDINGS: Lower chest: Stable 7 mm nodule at the right lung base. No acute pulmonary findings. No pleural effusion. The heart is mildly enlarged but stable. Stable coronary artery calcifications. The distal esophagus is grossly normal. Hepatobiliary: No focal hepatic lesions or intrahepatic biliary dilatation. The gallbladder is normal. No common bile duct dilatation. Pancreas: No mass, inflammation or ductal dilatation. Spleen: Normal size.  No focal lesions. Adrenals/Urinary Tract: Adrenal glands and kidneys are normal. Small renal cysts. No worrisome renal lesions. No hydronephrosis. The bladder is grossly normal. Stomach/Bowel: The stomach, duodenum and small bowel are grossly normal. There is chronic colonic distension but more so on today's exam. The sigmoid colon is markedly distended and tortuous. I do not see an obvious volvulus. There is a transition zone near where there appear to be surgical changes involving the sigmoid colon. There could be a stricture or intermittent volvulus. Recommend barium enema with water-soluble contrast material. Vascular/Lymphatic: Advanced vascular calcifications but no focal aneurysm or dissection. The branch vessels are grossly patent. Advanced calcifications are noted. The major venous structures are patent. Reproductive: The uterus and ovaries are grossly normal. Other: No ascites or abdominal wall hernia. Musculoskeletal: No significant bony findings. Severe scoliosis and degenerative lumbar spondylosis. Healed sacral insufficiency fractures and probable healed right pubic bone fracture. IMPRESSION: 1. Markedly distended colon which appears to be an  intermittent problem with this patient. There could be a distal stricture or possible intermittent sigmoid volvulus. 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 At some point a water-soluble contrast enema may be helpful for further evaluation. 2. Severe atherosclerotic calcifications involving the aorta and branch vessels. 3. Healed pelvic  insufficiency fractures. Electronically Signed   By: Rudie Meyer M.D.   On: 08/19/2017 15:06    Procedures Procedures (including critical care time)  Medications Ordered in ED Medications  fentaNYL (SUBLIMAZE) injection 50 mcg (50 mcg Intravenous Given 08/19/17 1316)  iopamidol (ISOVUE-300) 61 % injection 100 mL (100 mLs Intravenous Contrast Given 08/19/17 1418)  potassium chloride SA (K-DUR,KLOR-CON) CR tablet 40 mEq (40 mEq Oral Given 08/19/17 1605)     Initial Impression / Assessment and Plan / ED Course  I have reviewed the triage vital signs and the nursing notes.  Pertinent labs & imaging results that were available during my care of the patient were reviewed by me and considered in my medical decision making (see chart for details).     Pt here for evaluation of abdomina pain, diarrhea, BLE edema/ecchymosis.  CT abd obtained given progressive pain and distension - possible intermittent volvulus.  No evidence of acute obstruction.  D/w hospitalist at [facility] for observation admission.  Hospitalist recommends Surgery consult.  D/w Dr. Daphine Deutscher with General Surgery - when patient arrives to [facility] please have the on-call surgeon notified.  Patient updated of findings of studies.    Final Clinical Impressions(s) / ED Diagnoses   Final diagnoses:  None    ED Discharge Orders    None       Tilden Fossa, MD 08/19/17 1711

## 2017-08-19 NOTE — ED Notes (Signed)
Attempted to call report x 1  

## 2017-08-19 NOTE — ED Notes (Signed)
Out with [facility], no changes, family present, denies questions or needs

## 2017-08-19 NOTE — ED Notes (Signed)
EMT went to place patient on monitor to validate vitals. Husband became very loud and stated "there was no reason for my wife to wait, we have been waiting for an ambulance to come get her for 4 hours. You people have no communication skills. I feel like we are dying." EMT verbalized understanding but stated that we are doing everything we can to make sure all of our patients are safe and leave her stable and safe. Stated there are a couple people that need to be transported as well as her but we are not sure the time frame. [facility] can have anything come up at anytime." Husband still stated in an angry voice " This is just ridiculous she needs to be transported now." Patient is no in any distress, lying in bed watching TV. VSS. RN and charge nurse aware.

## 2017-08-19 NOTE — ED Notes (Signed)
SPO2 low after pain med. Placed on O2 [place] 2L. Alert, NAD, calm, interactive, resps e/u, speaking in clear complete sentences, no dyspnea noted, skin W&D, VSS, reports pain improved, (denies: sob, nausea, dizziness or visual changes). [facility]  Wait, plan, process explained with rationale. Pt updated, denies further questions or needs.

## 2017-08-19 NOTE — Progress Notes (Signed)
Patient received via stretcher accompanied by [facility] Staff. Patient alert and oriented. In good spirits, family members (spouse and daughter) at bedside. Patient answers all questions appropriately. Numerous ecchymotic areas noted on all extremities, pt reports  "these are due to being on Eliquis and Prednisone for so long." No open areas noted on extemities, numerous stages of healing bruises noted. Pt oriented to room and bed controls, call lights and phone. Admitting doctor at bedside.

## 2017-08-19 NOTE — H&P (Signed)
[facility]                                                                                    Patient Demographics  Beverly Lawrence, is a 72 y.o. female  CSN: [id number]  MRN: [id number]  DOB - Jul 04, 1946  Admit Date - 08/19/2017  Outpatient Primary MD for the patient is Derrell Lolling [place]., MD   With History of -  Past Medical History:  Diagnosis Date  . Anemia    WARM AUTOIMMUNE HEMOLYTIC ANEMIA  (diagnosed 11/2014; [facility]. [facility])  . Anxiety   . Arthritis    currently being seen by rheumatologist for confirmed diagnosing  . Autoimmune hemolytic anemia (HCC)   . CHF (congestive heart failure) (HCC)    diastolic CHF  . Diverticulitis of colon 1994   with colostomy placed and later reversed  . Dysrhythmia    patient states that she has a "premature beat"  . GERD (gastroesophageal reflux disease)   . Headache   . Heart murmur    moderate MR/AR, moderate pulm HTN 04/2015 echo  . 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 Hx: UTI (urinary tract infection)   . Hypertension   . Hyperthyroidism   . Hyponatremia   . Peritonitis (HCC) 1977   hx of  . PONV (postoperative nausea and vomiting)   . Small bowel obstruction [facility])       Past Surgical History:  Procedure Laterality Date  . APPENDECTOMY  1966  . COLON SURGERY     colostomy placed in 1994 and reversed in 1995  . EYE SURGERY Bilateral 2016   cataracts  . LEG SURGERY Bilateral 2011   vein ablation in both legs per patient  . NASAL SINUS SURGERY Bilateral 05/15/2015   Procedure: ENDOSCOPIC SINUS SURGERY;  Surgeon: Melvenia Beam, MD;  Location: [facility] OR;  Service: ENT;  Laterality: Bilateral;  . OVARIAN CYST REMOVAL Left 1966  . pilonidal cyst  1972  . SINUS ENDO W/FUSION Bilateral 12/20/2014   Procedure: BILATERAL ENDOSCOPIC SINUS SURGERY WITH NAVIGATION/BALLOON DILATION;  Surgeon: Melvenia Beam, MD;  Location: [facility] OR;  Service: ENT;  Laterality: Bilateral;  . small bowel  obs surgery    . VITRECTOMY Left 2015    in for   Chief Complaint  Patient presents with  . Abdominal Pain     HPI  Beverly Lawrence  is a 72 y.o. 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  No relation to food and was seen on Tuesday at [facility] and today on [facility] for the same complaint.  She feels that the pain is worsening with increase in distention .  Patient denies any fever or chills.  Denies any cough    Review of Systems    In addition to the HPI above,  No Fever-chills, No problems swallowing food or Liquids, No Chest pain, Cough or Shortness of Breath, No Blood in stool or Urine, No dysuria, No new  skin rashes or bruises, No new joints pains-aches,  No new weakness, tingling, numbness in any extremity, No recent weight gain or loss, No polyuria, polydypsia or polyphagia, No significant Mental Stressors.  A full 10 point Review of Systems was done, except as stated above, all other Review of Systems were negative.   Social History Social History   Tobacco Use  . Smoking status: Former Smoker    Last attempt to quit: 07/05/2004    Years since quitting: 13.1  . Smokeless tobacco: Never Used  Substance Use Topics  . Alcohol use: No    Frequency: Never     Family History No family history on file.   Prior to Admission medications   Medication Sig Start Date End Date Taking? Authorizing Provider  amLODipine (NORVASC) 10 MG tablet Take 10 mg by mouth daily.   Yes [provider]  diltiazem (DILACOR XR) 180 MG 24 hr capsule Take 180 mg by mouth daily.   Yes [provider]  ELIQUIS 5 MG TABS tablet Take 5 mg by mouth 2 (two) times daily.  05/12/17  Yes [provider]  folic acid (FOLVITE) 1 MG tablet Take 1 mg by mouth 2 (two) times daily.  11/22/14  Yes [provider]  furosemide (LASIX) 20 MG tablet  07/11/17  Yes [provider]  labetalol (NORMODYNE) 100 MG tablet Take 100 mg by mouth 2 (two) times daily.   Yes [provider]  oxybutynin (DITROPAN-XL) 5 MG 24 hr tablet Take 5 mg by mouth at bedtime.   Yes [provider]  pantoprazole (PROTONIX) 40 MG tablet Take 40 mg by mouth 2 (two) times daily.  11/28/14  Yes [provider]  Potassium Chloride ER 20 MEQ TBCR TK 1 T PO D 06/15/17  Yes [provider]  predniSONE (DELTASONE) 10 MG tablet Take 20 mg by mouth 3 (three) times daily.   Yes [provider]  promethazine (PHENERGAN) 12.5 MG tablet Take 12.5 mg by mouth every 6 (six) hours as needed for nausea or vomiting.   Yes [provider]  HYDROcodone-acetaminophen (NORCO/VICODIN) 5-325 MG tablet Take 1-2 tablets by mouth every 4 (four) hours as needed for moderate pain or severe pain. Patient not taking: Reported on 08/19/2017 05/16/15   Melvenia Beam, MD  ondansetron (ZOFRAN-ODT) 4 MG disintegrating tablet Take 1 tablet (4 mg total) by mouth every 6 (six) hours as needed for nausea, vomiting or refractory nausea / vomiting. Patient not taking: Reported on 08/19/2017 05/16/15   Melvenia Beam, MD  zolpidem (AMBIEN) 5 MG tablet Take 5 mg by mouth at bedtime as needed for sleep.  12/12/14   [provider]    Allergies  Allergen Reactions  . Lisinopril Swelling    Facial swelling  . Codeine Other (See Comments)    unknown  . Erythromycin Other (See Comments)    unknown  . Morphine And Related Other (See Comments)    unknown  . Penicillins Hives and Other (See Comments)    Has patient had a PCN reaction causing immediate rash, facial/tongue/throat swelling, SOB or lightheadedness with hypotension: No Has patient had a PCN reaction causing severe rash involving mucus membranes or skin necrosis: No Has patient had a PCN reaction that required  hospitalization No Has patient had a PCN reaction occurring within the last 10 years: No If all of the above answers are "NO", then may proceed with Cephalosporin use.   Marland Kitchen Percocet [Oxycodone-Acetaminophen] Other (See Comments)  unknown  . Percodan [Oxycodone-Aspirin] Other (See Comments)    unknown    Physical Exam  Vitals  Blood pressure 135/80, pulse 88, temperature 98.7 F (37.1 C), temperature source Oral, resp. rate 18, height 5\' 3"  (1.6 m), weight 55.3 kg (122 lb), SpO2 94 %.   1. General well-developed, well-nourished female, in no acute distress  2. Normal affect and insight, Not Suicidal or Homicidal, Awake Alert, Oriented X 3.  3. No F.N deficits, grossly, patient moving all extremities.  4. Ears and Eyes appear Normal, Conjunctivae clear, PERRLA. Moist Oral Mucosa.  5. Supple Neck, No JVD, No cervical lymphadenopathy appriciated, No Carotid Bruits.  6. Symmetrical Chest wall movement, Good air movement bilaterally, CTAB.  7. RRR, No Gallops, Rubs or Murmurs, No Parasternal Heave.  8. Positive Bowel Sounds, Abdomen distended, generalized tenderness, .  9.  No Cyanosis, Normal Skin Turgor, ecchymosis noted both lower extremities and abdomen.  10. Good muscle tone,  joints appear normal , no effusions, Normal ROM.   Data Review  CBC Recent Labs  Lab 08/19/17 1237  WBC 6.4  HGB 8.0*  HCT 23.8*  PLT 205  MCV 102.1*  MCH 34.3*  MCHC 33.6  RDW 17.7*   ------------------------------------------------------------------------------------------------------------------  Chemistries  Recent Labs  Lab 08/19/17 1237  NA 134*  K 3.0*  CL 92*  CO2 31  GLUCOSE 117*  BUN 19  CREATININE 0.71  CALCIUM 8.2*  AST 23  ALT 21  ALKPHOS 70  BILITOT 2.0*   ------------------------------------------------------------------------------------------------------------------ estimated creatinine clearance is 52.6 mL/min (by C-G formula based on SCr of 0.71  mg/dL). ------------------------------------------------------------------------------------------------------------------ No results for input(s): TSH, T4TOTAL, T3FREE, THYROIDAB in the last 72 hours.  Invalid input(s): FREET3   Coagulation profile Recent Labs  Lab 08/19/17 1237  INR 0.91   ------------------------------------------------------------------------------------------------------------------- No results for input(s): DDIMER in the last 72 hours. -------------------------------------------------------------------------------------------------------------------  Cardiac Enzymes No results for input(s): CKMB, TROPONINI, MYOGLOBIN in the last 168 hours.  Invalid input(s): CK ------------------------------------------------------------------------------------------------------------------ Invalid input(s): POCBNP   ---------------------------------------------------------------------------------------------------------------  Urinalysis    Component Value Date/Time   COLORURINE YELLOW 08/19/2017 1205   APPEARANCEUR CLEAR 08/19/2017 1205   LABSPEC <1.005 (L) 08/19/2017 1205   PHURINE 6.0 08/19/2017 1205   GLUCOSEU NEGATIVE 08/19/2017 1205   HGBUR NEGATIVE 08/19/2017 1205   BILIRUBINUR NEGATIVE 08/19/2017 1205   KETONESUR NEGATIVE 08/19/2017 1205   PROTEINUR NEGATIVE 08/19/2017 1205   NITRITE NEGATIVE 08/19/2017 1205   LEUKOCYTESUR NEGATIVE 08/19/2017 1205    ----------------------------------------------------------------------------------------------------------------     Imaging results:   Ct Abdomen Pelvis W Contrast  Result Date: 08/19/2017 CLINICAL DATA:  Hemolytic anemia.  Abdominal distension for 4 days. EXAM: CT ABDOMEN AND PELVIS WITH CONTRAST TECHNIQUE: Multidetector CT imaging of the abdomen and pelvis was performed using the standard protocol following bolus administration of intravenous contrast. CONTRAST:  100mL ISOVUE-300 IOPAMIDOL  (ISOVUE-300) INJECTION 61% COMPARISON:  CT scan 08/17/2017 FINDINGS: Lower chest: Stable 7 mm nodule at the right lung base. No acute pulmonary findings. No pleural effusion. The heart is mildly enlarged but stable. Stable coronary artery calcifications. The distal esophagus is grossly normal. Hepatobiliary: No focal hepatic lesions or intrahepatic biliary dilatation. The gallbladder is normal. No common bile duct dilatation. Pancreas: No mass, inflammation or ductal dilatation. Spleen: Normal size.  No focal lesions. Adrenals/Urinary Tract: Adrenal glands and kidneys are normal. Small renal cysts. No worrisome renal lesions. No hydronephrosis. The bladder is grossly normal. Stomach/Bowel: The stomach, duodenum and small bowel are grossly normal. There is chronic colonic distension  but more so on today's exam. The sigmoid colon is markedly distended and tortuous. I do not see an obvious volvulus. There is a transition zone near where there appear to be surgical changes involving the sigmoid colon. There could be a stricture or intermittent volvulus. Recommend barium enema with water-soluble contrast material. Vascular/Lymphatic: Advanced vascular calcifications but no focal aneurysm or dissection. The branch vessels are grossly patent. Advanced calcifications are noted. The major venous structures are patent. Reproductive: The uterus and ovaries are grossly normal. Other: No ascites or abdominal wall hernia. Musculoskeletal: No significant bony findings. Severe scoliosis and degenerative lumbar spondylosis. Healed sacral insufficiency fractures and probable healed right pubic bone fracture. IMPRESSION: 1. Markedly distended colon which appears to be an intermittent problem with this patient. There could be a distal stricture or possible intermittent sigmoid volvulus. 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 At some point a water-soluble contrast enema may be helpful for further evaluation. 2. Severe atherosclerotic  calcifications involving the aorta and branch vessels. 3. Healed pelvic insufficiency fractures. Electronically Signed   By: Rudie Meyer M.D.   On: 08/19/2017 15:06      Assessment & Plan  1.  Abdominal pain/possible volvulus with increased distention and diarrhea 2.  History of colostomy reversed in 1995 3.  Autoimmune hemolytic anemia 4.  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 Started Flagyl IV Hold Eliquis IV fluids Pain control Surgery will see patient in a.m.   DVT Prophylaxis on Eliquis  AM Labs Ordered, also please review Full Orders    Code Status full  Disposition Plan: Home  Time spent in minutes : 44 minutes  Condition GUARDED   @SIGNATURE @

## 2017-08-20 ENCOUNTER — Encounter (HOSPITAL_COMMUNITY): Payer: Self-pay | Admitting: Internal Medicine

## 2017-08-20 ENCOUNTER — Inpatient Hospital Stay (HOSPITAL_COMMUNITY): Payer: Medicare Other

## 2017-08-20 DIAGNOSIS — R110 Nausea: Secondary | ICD-10-CM

## 2017-08-20 DIAGNOSIS — D591 Autoimmune hemolytic anemia, unspecified: Secondary | ICD-10-CM | POA: Diagnosis present

## 2017-08-20 DIAGNOSIS — I82409 Acute embolism and thrombosis of unspecified deep veins of unspecified lower extremity: Secondary | ICD-10-CM | POA: Diagnosis present

## 2017-08-20 DIAGNOSIS — I10 Essential (primary) hypertension: Secondary | ICD-10-CM

## 2017-08-20 DIAGNOSIS — I82401 Acute embolism and thrombosis of unspecified deep veins of right lower extremity: Secondary | ICD-10-CM

## 2017-08-20 LAB — BASIC METABOLIC PANEL
Anion gap: 6 (ref 5–15)
BUN: 14 mg/dL (ref 6–20)
CALCIUM: 8.3 mg/dL — AB (ref 8.9–10.3)
CO2: 34 mmol/L — AB (ref 22–32)
Chloride: 97 mmol/L — ABNORMAL LOW (ref 101–111)
Creatinine, Ser: 0.55 mg/dL (ref 0.44–1.00)
GFR calc non Af Amer: >60 mL/min (ref >60)
GLUCOSE: 79 mg/dL (ref 65–99)
Potassium: 2.7 mmol/L — CL (ref 3.5–5.1)
Sodium: 137 mmol/L (ref 135–145)

## 2017-08-20 LAB — CBC
HEMATOCRIT: 23 % — AB (ref 36.0–46.0)
HEMOGLOBIN: 7.8 g/dL — AB (ref 12.0–15.0)
MCH: 34.7 pg — AB (ref 26.0–34.0)
MCHC: 33.9 g/dL (ref 30.0–36.0)
MCV: 102.2 fL — AB (ref 78.0–100.0)
Platelets: 169 10*3/uL (ref 150–400)
RBC: 2.25 MIL/uL — ABNORMAL LOW (ref 3.87–5.11)
RDW: 18.3 % — AB (ref 11.5–15.5)
WBC: 4.4 10*3/uL (ref 4.0–10.5)

## 2017-08-20 LAB — C DIFFICILE QUICK SCREEN W PCR REFLEX
C DIFFICLE (CDIFF) ANTIGEN: NEGATIVE
C Diff interpretation: NOT DETECTED
C Diff toxin: NEGATIVE

## 2017-08-20 LAB — MAGNESIUM: Magnesium: 1.9 mg/dL (ref 1.7–2.4)

## 2017-08-20 MED ORDER — POTASSIUM CHLORIDE 10 MEQ/100ML IV SOLN
10.0000 meq | INTRAVENOUS | Status: AC
  Administered 2017-08-20 (×6): 10 meq via INTRAVENOUS
  Filled 2017-08-20 (×6): qty 100

## 2017-08-20 MED ORDER — POTASSIUM CHLORIDE CRYS ER 20 MEQ PO TBCR
40.0000 meq | EXTENDED_RELEASE_TABLET | Freq: Once | ORAL | Status: AC
  Administered 2017-08-20: 40 meq via ORAL
  Filled 2017-08-20: qty 4

## 2017-08-20 MED ORDER — APIXABAN 5 MG PO TABS
5.0000 mg | ORAL_TABLET | Freq: Two times a day (BID) | ORAL | Status: DC
  Administered 2017-08-20 – 2017-08-21 (×3): 5 mg via ORAL
  Filled 2017-08-20 (×3): qty 1

## 2017-08-20 MED ORDER — MAGNESIUM SULFATE IN D5W 1-5 GM/100ML-% IV SOLN
1.0000 g | Freq: Once | INTRAVENOUS | Status: AC
  Administered 2017-08-20: 1 g via INTRAVENOUS
  Filled 2017-08-20: qty 100

## 2017-08-20 NOTE — Progress Notes (Signed)
ANTICOAGULATION CONSULT NOTE - Initial Consult  Pharmacy Consult for apixaban Indication: DVT  Allergies  Allergen Reactions  . Lisinopril Swelling    Facial swelling  . Codeine Other (See Comments)    unknown  . Erythromycin Other (See Comments)    unknown  . Morphine And Related Other (See Comments)    unknown  . Penicillins Hives and Other (See Comments)    Has patient had a PCN reaction causing immediate rash, facial/tongue/throat swelling, SOB or lightheadedness with hypotension: No Has patient had a PCN reaction causing severe rash involving mucus membranes or skin necrosis: No Has patient had a PCN reaction that required hospitalization No Has patient had a PCN reaction occurring within the last 10 years: No If all of the above answers are "NO", then may proceed with Cephalosporin use.   Marland Kitchen Percocet [Oxycodone-Acetaminophen] Other (See Comments)    unknown  . Percodan [Oxycodone-Aspirin] Other (See Comments)    unknown    Patient Measurements: Height: 5\' 3"  (160 cm) Weight: 122 lb (55.3 kg) IBW/kg (Calculated) : 52.4  Vital Signs: Temp: 98.9 F (37.2 C) (02/16 0934) Temp Source: Oral (02/16 0934) BP: 114/71 (02/16 0945) Pulse Rate: 92 (02/16 0945)  Labs: Recent Labs    08/19/17 1237 08/20/17 0834  HGB 8.0* 7.8*  HCT 23.8* 23.0*  PLT 205 169  LABPROT 12.2  --   INR 0.91  --   CREATININE 0.71 0.55    Estimated Creatinine Clearance: 52.6 mL/min (by C-G formula based on SCr of 0.55 mg/dL).   Medical History: Past Medical History:  Diagnosis Date  . Anemia    WARM AUTOIMMUNE HEMOLYTIC ANEMIA  (diagnosed 11/2014; [facility]. [facility])  . Anxiety   . Arthritis    currently being seen by rheumatologist for confirmed diagnosing  . Autoimmune hemolytic anemia (HCC)   . CHF (congestive heart failure) (HCC)    diastolic CHF  . Diverticulitis of colon 1994   with colostomy placed and later reversed  . DVT (deep venous thrombosis) (HCC)    . Dysrhythmia    patient states that she has a "premature beat"  . GERD (gastroesophageal reflux disease)   . Headache   . Heart murmur    moderate MR/AR, moderate pulm HTN 04/2015 echo  . 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 Hx: UTI (urinary tract infection)   . Hypertension   . Hyperthyroidism   . Hyponatremia   . Peritonitis (HCC) 1977   hx of  . PONV (postoperative nausea and vomiting)   . Small bowel obstruction (HCC)     Medications:  Medications Prior to Admission  Medication Sig Dispense Refill Last Dose  . amLODipine (NORVASC) 10 MG tablet Take 10 mg by mouth daily.   08/19/2017 at Unknown time  . diltiazem (DILACOR XR) 180 MG 24 hr capsule Take 180 mg by mouth daily.   08/19/2017 at 0500  . ELIQUIS 5 MG TABS tablet Take 5 mg by mouth 2 (two) times daily.   0 08/19/2017 at 0500  . folic acid (FOLVITE) 1 MG tablet Take 1 mg by mouth 2 (two) times daily.  2 08/19/2017 at Unknown time  . furosemide (LASIX) 20 MG tablet   0 08/19/2017 at Unknown time  . labetalol (NORMODYNE) 100 MG tablet Take 100 mg by mouth 2 (two) times daily.   08/19/2017 at 0500  . oxybutynin (DITROPAN-XL) 5 MG 24 hr tablet Take 5 mg by mouth  at bedtime.   08/18/2017 at Unknown time  . pantoprazole (PROTONIX) 40 MG tablet Take 40 mg by mouth 2 (two) times daily.   3 08/19/2017 at Unknown time  . Potassium Chloride ER 20 MEQ TBCR TK 1 T PO D  0 08/19/2017 at Unknown time  . predniSONE (DELTASONE) 10 MG tablet Take 20 mg by mouth 3 (three) times daily.   08/19/2017 at Unknown time  . promethazine (PHENERGAN) 12.5 MG tablet Take 12.5 mg by mouth every 6 (six) hours as needed for nausea or vomiting.   08/19/2017 at Unknown time  . HYDROcodone-acetaminophen (NORCO/VICODIN) 5-325 MG tablet Take 1-2 tablets by mouth every 4 (four) hours as needed for moderate pain or severe pain. (Patient not taking: Reported on 08/19/2017) 60 tablet 0 Completed Course at Unknown time  . ondansetron (ZOFRAN-ODT) 4 MG  disintegrating tablet Take 1 tablet (4 mg total) by mouth every 6 (six) hours as needed for nausea, vomiting or refractory nausea / vomiting. (Patient not taking: Reported on 08/19/2017) 20 tablet 2 Completed Course at Unknown time  . zolpidem (AMBIEN) 5 MG tablet Take 5 mg by mouth at bedtime as needed for sleep.    05/14/2015 at Unknown time    Assessment: 71yo F admitted with abdominal pain. Pharmacy is asked to resume apixaban for history of DVT. SCr wnl. H/H low but history of anemia.   Plan:  Resume apixaban 5mg  BID. Pharmacy will sign-off.   Charolotte Ekeom Azalee Weimer, PharmD. Mobile: [phone]. 08/20/2017,12:26 PM.

## 2017-08-20 NOTE — Progress Notes (Signed)
@IPLOG @        PROGRESS NOTE                                                                                                                                                                                                             Patient Demographics:    Beverly Lawrence, is a 72 y.o. female, DOB - 1946/06/13, ZOX:[id number]  Admit date - 08/19/2017   Admitting Physician Amrit Salli Quarry, MD  Outpatient Primary MD for the patient is Derrell Lolling Garrison [place]., MD  LOS - 1  Chief Complaint  Patient presents with  . Abdominal Pain       Brief Narrative   -  Beverly Lawrence  is a 72 y.o. 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  No relation to food and was seen on Tuesday at [facility] and today on [facility] for the same complaint.  She feels that the pain is worsening with increase in distention .  Patient denies any fever or chills.  Denies any cough    Subjective:    Beverly Lawrence today has, No headache, No chest pain, No abdominal pain - No Nausea, No new weakness tingling or numbness, No Cough - SOB.     Assessment  & Plan :     1.  Abdominal pain.  Likely related to gastroenteritis, abdominal exam unremarkable, doubt there is any volvulus, CT scan findings could be secondary to gastroenteritis, her diarrheal illness has resolved.  Seen by general surgery.  Abdominal exam benign.  Advance diet, supportive care, if stable discharge tomorrow.  2.  History of DVT.  On Eliquis continue.  3.  Remote history of autoimmune hemolytic anemia.  No acute issues supportive care.  4.  Hypokalemia.  Replaced will monitor potassium and magnesium levels.    Diet : Diet clear liquid Room service appropriate? Yes; Fluid consistency: Thin    Family Communication  :  None  Code Status :  Full  Disposition Plan  :   Home in am if better  Consults  :  CCS  Procedures  :    CT -   1. Markedly distended colon which appears to be an intermittent problem with this patient. There could be a distal stricture or possible intermittent sigmoid volvulus. 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 At some point a water-soluble contrast enema may be helpful for further evaluation.  2. Severe atherosclerotic calcifications involving the aorta and branch vessels. 3. Healed pelvic insufficiency fractures  DVT Prophylaxis  :  Eliquis  Lab Results  Component Value Date   PLT 169 08/20/2017    Inpatient Medications  Scheduled Meds: . amLODipine  10 mg Oral Daily  . diltiazem  180 mg Oral Daily  . folic acid  1 mg Oral BID  . labetalol  100 mg Oral BID  . methylPREDNISolone (SOLU-MEDROL) injection  60 mg Intravenous BID  . oxybutynin  5 mg Oral QHS  . pantoprazole (PROTONIX) IV  40 mg Intravenous Q12H  . potassium chloride  40 mEq Oral Once   Continuous Infusions: . dextrose 5 % and 0.45% NaCl 50 mL/hr at 08/20/17 0300  . magnesium sulfate 1 - 4 g bolus IVPB    . metronidazole 500 mg (08/20/17 0557)  . potassium chloride     PRN Meds:.HYDROmorphone (DILAUDID) injection, zolpidem  Antibiotics  :    Anti-infectives (From admission, onward)   Start     Dose/Rate Route Frequency Ordered Stop   08/19/17 2345  metroNIDAZOLE (FLAGYL) IVPB 500 mg     500 mg 100 mL/hr over 60 Minutes Intravenous Every 8 hours 08/19/17 2332           Objective:   Vitals:   08/19/17 2256 08/20/17 0507 08/20/17 0934 08/20/17 0945  BP: 135/80 (!) 111/52 (!) 159/116 114/71  Pulse: 88 68 67 92  Resp: 18 18 18    Temp: 98.7 F (37.1 C) 98.7 F (37.1 C) 98.9 F (37.2 C)   TempSrc: Oral Oral Oral   SpO2: 94% 93% 96%   Weight:      Height:        Wt Readings from Last 3 Encounters:  08/19/17 55.3 kg (122 lb)  05/15/15 66.6 kg (146 lb 12.8 oz)  05/13/15 66.6 kg (146 lb 12.8 oz)     Intake/Output Summary (Last 24 hours) at  08/20/2017 1212 Last data filed at 08/20/2017 0907 Gross per 24 hour  Intake 250 ml  Output 700 ml  Net -450 ml     Physical Exam  Awake Alert, Oriented X 3, No new F.N deficits, Normal affect [place].AT,PERRAL Supple Neck,No JVD, No cervical lymphadenopathy appriciated.  Symmetrical Chest wall movement, Good air movement bilaterally, CTAB RRR,No Gallops,Rubs or new Murmurs, No Parasternal Heave +ve B.Sounds, Abd Soft, No tenderness, No organomegaly appriciated, No rebound - guarding or rigidity. No Cyanosis, Clubbing or edema, No new Rash or bruise       Data Review:    CBC Recent Labs  Lab 08/19/17 1237 08/20/17 0834  WBC 6.4 4.4  HGB 8.0* 7.8*  HCT 23.8* 23.0*  PLT 205 169  MCV 102.1* 102.2*  MCH 34.3* 34.7*  MCHC 33.6 33.9  RDW 17.7* 18.3*    Chemistries  Recent Labs  Lab 08/19/17 1237 08/20/17 0834  NA 134* 137  K 3.0* 2.7*  CL 92* 97*  CO2 31 34*  GLUCOSE 117* 79  BUN 19 14  CREATININE 0.71 0.55  CALCIUM 8.2* 8.3*  MG  --  1.9  AST 23  --   ALT 21  --   ALKPHOS 70  --   BILITOT 2.0*  --    ------------------------------------------------------------------------------------------------------------------ No results for input(s): CHOL, HDL, LDLCALC, TRIG, CHOLHDL, LDLDIRECT in the last 72 hours.  No results found for: HGBA1C ------------------------------------------------------------------------------------------------------------------ No results for input(s): TSH, T4TOTAL, T3FREE, THYROIDAB in the last 72 hours.  Invalid input(s): FREET3 ------------------------------------------------------------------------------------------------------------------ No results for  input(s): VITAMINB12, FOLATE, FERRITIN, TIBC, IRON, RETICCTPCT in the last 72 hours.  Coagulation profile Recent Labs  Lab 08/19/17 1237  INR 0.91    No results for input(s): DDIMER in the last 72 hours.  Cardiac Enzymes No results for input(s): CKMB, TROPONINI, MYOGLOBIN in the  last 168 hours.  Invalid input(s): CK ------------------------------------------------------------------------------------------------------------------ No results found for: BNP  Micro Results No results found for this or any previous visit (from the past 240 hour(s)).  Radiology Reports Ct Abdomen Pelvis W Contrast  Result Date: 08/19/2017 CLINICAL DATA:  Hemolytic anemia.  Abdominal distension for 4 days. EXAM: CT ABDOMEN AND PELVIS WITH CONTRAST TECHNIQUE: Multidetector CT imaging of the abdomen and pelvis was performed using the standard protocol following bolus administration of intravenous contrast. CONTRAST:  100mL ISOVUE-300 IOPAMIDOL (ISOVUE-300) INJECTION 61% COMPARISON:  CT scan 08/17/2017 FINDINGS: Lower chest: Stable 7 mm nodule at the right lung base. No acute pulmonary findings. No pleural effusion. The heart is mildly enlarged but stable. Stable coronary artery calcifications. The distal esophagus is grossly normal. Hepatobiliary: No focal hepatic lesions or intrahepatic biliary dilatation. The gallbladder is normal. No common bile duct dilatation. Pancreas: No mass, inflammation or ductal dilatation. Spleen: Normal size.  No focal lesions. Adrenals/Urinary Tract: Adrenal glands and kidneys are normal. Small renal cysts. No worrisome renal lesions. No hydronephrosis. The bladder is grossly normal. Stomach/Bowel: The stomach, duodenum and small bowel are grossly normal. There is chronic colonic distension but more so on today's exam. The sigmoid colon is markedly distended and tortuous. I do not see an obvious volvulus. There is a transition zone near where there appear to be surgical changes involving the sigmoid colon. There could be a stricture or intermittent volvulus. Recommend barium enema with water-soluble contrast material. Vascular/Lymphatic: Advanced vascular calcifications but no focal aneurysm or dissection. The branch vessels are grossly patent. Advanced calcifications are  noted. The major venous structures are patent. Reproductive: The uterus and ovaries are grossly normal. Other: No ascites or abdominal wall hernia. Musculoskeletal: No significant bony findings. Severe scoliosis and degenerative lumbar spondylosis. Healed sacral insufficiency fractures and probable healed right pubic bone fracture. IMPRESSION: 1. Markedly distended colon which appears to be an intermittent problem with this patient. There could be a distal stricture or possible intermittent sigmoid volvulus. 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 At some point a water-soluble contrast enema may be helpful for further evaluation. 2. Severe atherosclerotic calcifications involving the aorta and branch vessels. 3. Healed pelvic insufficiency fractures. Electronically Signed   By: Rudie MeyerP.  Gallerani M.D.   On: 08/19/2017 15:06   Dg Abd Portable 1v  Result Date: 08/20/2017 CLINICAL DATA:  Abdominal distension and nausea. EXAM: PORTABLE ABDOMEN - 1 VIEW COMPARISON:  CT abdomen pelvis 08/19/2017. FINDINGS: Mild gaseous distension of small bowel with marked gaseous distension of the transverse colon. No unexpected radiopaque calculi. Contrast is seen in the bladder. Scoliosis. IMPRESSION: Persistent gaseous distension of the transverse colon. Electronically Signed   By: Leanna BattlesMelinda  Blietz M.D.   On: 08/20/2017 08:15    Time Spent in minutes  30   Susa RaringPrashant Abelina Ketron M.D on 08/20/2017 at 12:12 PM  Between 7am to 7pm - Pager - [phone] ( page via [email or web address], text pages only, please mention full 10 digit call back number). After 7pm go to [email or web address] - password [email or web address]

## 2017-08-20 NOTE — Consult Note (Signed)
Reason for Consult:colonic distention, abd pain Referring Physician: Dr Rosita Kea Beverly Lawrence is an 72 y.o. female.  HPI: 72 year old female who presents to the hospital from [facility] urgent care with complaints of acute onset diarrhea and abdominal distention.  This occurred starting on Monday.  She has never had any trouble with her bowel habits prior to this.  She denies any fevers.  She did have some nausea initially.  She has a history of autoimmune hemolytic and has also developed some ecchymosis along her left thigh.  Her last colonoscopy per patient was approximately 1 year ago and normal.  She is due again 5 years after that colonoscopy.  She most recently has been in rehab after a fall that occurred around Christmas.  She also has a history of perforated diverticulitis with Hartmann's procedure and then subsequent colostomy reversal.  Past Medical History:  Diagnosis Date  . Anemia    WARM AUTOIMMUNE HEMOLYTIC ANEMIA  (diagnosed 11/2014; [facility]. Corene Cornea [facility])  . Anxiety   . Arthritis    currently being seen by rheumatologist for confirmed diagnosing  . Autoimmune hemolytic anemia (HCC)   . CHF (congestive heart failure) (HCC)    diastolic CHF  . Diverticulitis of colon 1994   with colostomy placed and later reversed  . Dysrhythmia    patient states that she has a "premature beat"  . GERD (gastroesophageal reflux disease)   . Headache   . Heart murmur    moderate MR/AR, moderate pulm HTN 04/2015 echo  . 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 Hx: UTI (urinary tract infection)   . Hypertension   . Hyperthyroidism   . Hyponatremia   . Peritonitis ([place]) 1977   hx of  . PONV (postoperative nausea and vomiting)   . Small bowel obstruction [facility])     Past Surgical History:  Procedure Laterality Date  . APPENDECTOMY  1966  . COLON SURGERY     colostomy placed in 1994 and reversed in 1995  . EYE SURGERY Bilateral 2016   cataracts  . LEG SURGERY  Bilateral 2011   vein ablation in both legs per patient  . NASAL SINUS SURGERY Bilateral 05/15/2015   Procedure: ENDOSCOPIC SINUS SURGERY;  Surgeon: Ruby Cola, MD;  Location: [place];  Service: ENT;  Laterality: Bilateral;  . OVARIAN CYST REMOVAL Left 1966  . pilonidal cyst  1972  . SINUS ENDO W/FUSION Bilateral 12/20/2014   Procedure: BILATERAL ENDOSCOPIC SINUS SURGERY WITH NAVIGATION/BALLOON DILATION;  Surgeon: Ruby Cola, MD;  Location: [place];  Service: ENT;  Laterality: Bilateral;  . small bowel obs surgery    . VITRECTOMY Left 2015    No family history on file.  Social History:  reports that she quit smoking about 13 years ago. she has never used smokeless tobacco. She reports that she does not drink alcohol or use drugs.  Allergies:  Allergies  Allergen Reactions  . Lisinopril Swelling    Facial swelling  . Codeine Other (See Comments)    unknown  . Erythromycin Other (See Comments)    unknown  . Morphine And Related Other (See Comments)    unknown  . Penicillins Hives and Other (See Comments)    Has patient had a PCN reaction causing immediate rash, facial/tongue/throat swelling, SOB or lightheadedness with hypotension: No Has patient had a PCN reaction causing severe rash involving mucus membranes or skin necrosis: No Has patient had a PCN  reaction that required hospitalization No Has patient had a PCN reaction occurring within the last 10 years: No If all of the above answers are "NO", then may proceed with Cephalosporin use.   Marland Kitchen Percocet [Oxycodone-Acetaminophen] Other (See Comments)    unknown  . Percodan [Oxycodone-Aspirin] Other (See Comments)    unknown    Medications: I have reviewed the patient's current medications.  Results for orders placed or performed during the hospital encounter of 08/19/17 (from the past 48 hour(s))  Urinalysis, Routine w reflex microscopic     Status: Abnormal   Collection Time: 08/19/17 12:05 PM  Result Value Ref Range    Color, Urine YELLOW YELLOW   APPearance CLEAR CLEAR   Specific Gravity, Urine <1.005 (L) 1.005 - 1.030   pH 6.0 5.0 - 8.0   Glucose, UA NEGATIVE NEGATIVE mg/dL   Hgb urine dipstick NEGATIVE NEGATIVE   Bilirubin Urine NEGATIVE NEGATIVE   Ketones, ur NEGATIVE NEGATIVE mg/dL   Protein, ur NEGATIVE NEGATIVE mg/dL   Nitrite NEGATIVE NEGATIVE   Leukocytes, UA NEGATIVE NEGATIVE    Comment: Microscopic not done on urines with negative protein, blood, leukocytes, nitrite, or glucose < 500 mg/dL. Performed at [facility], [address] [place], [place] [address]   Lipase, blood     Status: None   Collection Time: 08/19/17 12:37 PM  Result Value Ref Range   Lipase 23 11 - 51 U/L    Comment: Performed at [facility], [address] [place], [place] [address]  Comprehensive metabolic panel     Status: Abnormal   Collection Time: 08/19/17 12:37 PM  Result Value Ref Range   Sodium 134 (L) 135 - 145 mmol/L   Potassium 3.0 (L) 3.5 - 5.1 mmol/L   Chloride 92 (L) 101 - 111 mmol/L   CO2 31 22 - 32 mmol/L   Glucose, Bld 117 (H) 65 - 99 mg/dL   BUN 19 6 - 20 mg/dL   Creatinine, Ser 0.71 0.44 - 1.00 mg/dL   Calcium 8.2 (L) 8.9 - 10.3 mg/dL   Total Protein 6.1 (L) 6.5 - 8.1 g/dL   Albumin 3.5 3.5 - 5.0 g/dL   AST 23 15 - 41 U/L   ALT 21 14 - 54 U/L   Alkaline Phosphatase 70 38 - 126 U/L   Total Bilirubin 2.0 (H) 0.3 - 1.2 mg/dL   GFR calc non Af Amer >60 >60 mL/min   GFR calc Af Amer >60 >60 mL/min    Comment: (NOTE) The eGFR has been calculated using the CKD EPI equation. This calculation has not been validated in all clinical situations. eGFR's persistently <60 mL/min signify possible Chronic Kidney Disease.    Anion gap 11 5 - 15    Comment: Performed at [facility], [address] [place], [place] [address]  CBC     Status: Abnormal   Collection Time: 08/19/17 12:37 PM  Result Value Ref Range   WBC 6.4 4.0 - 10.5 K/uL   RBC 2.33 (L) 3.87 - 5.11  MIL/uL   Hemoglobin 8.0 (L) 12.0 - 15.0 g/dL   HCT 23.8 (L) 36.0 - 46.0 %   MCV 102.1 (H) 78.0 - 100.0 fL   MCH 34.3 (H) 26.0 - 34.0 pg   MCHC 33.6 30.0 - 36.0 g/dL   RDW 17.7 (H) 11.5 - 15.5 %   Platelets 205 150 - 400 K/uL    Comment: Performed at [facility], [address] [place], [place] [address]  Protime-INR     Status: None   Collection Time: 08/19/17 12:37 PM  Result Value Ref Range   Prothrombin Time 12.2 11.4 - 15.2 seconds   INR 0.91     Comment: Performed at [facility], [address] [place], [place] [address]  I-Stat CG4 Lactic Acid, ED     Status: None   Collection Time: 08/19/17 12:50 PM  Result Value Ref Range   Lactic Acid, Venous 1.58 0.5 - 1.9 mmol/L  CBC     Status: Abnormal   Collection Time: 08/20/17  8:34 AM  Result Value Ref Range   WBC 4.4 4.0 - 10.5 K/uL   RBC 2.25 (L) 3.87 - 5.11 MIL/uL   Hemoglobin 7.8 (L) 12.0 - 15.0 g/dL   HCT 23.0 (L) 36.0 - 46.0 %   MCV 102.2 (H) 78.0 - 100.0 fL   MCH 34.7 (H) 26.0 - 34.0 pg   MCHC 33.9 30.0 - 36.0 g/dL   RDW 18.3 (H) 11.5 - 15.5 %   Platelets 169 150 - 400 K/uL    Comment: Performed at [facility], [place] [address], [place], [place] [address]  Basic metabolic panel     Status: Abnormal   Collection Time: 08/20/17  8:34 AM  Result Value Ref Range   Sodium 137 135 - 145 mmol/L   Potassium 2.7 (LL) 3.5 - 5.1 mmol/L    Comment: CRITICAL RESULT CALLED TO, READ BACK BY AND VERIFIED WITH: P.ARMSTRONG,RN 08/20/17 '@0957'$  BY V.WILKINS    Chloride 97 (L) 101 - 111 mmol/L   CO2 34 (H) 22 - 32 mmol/L   Glucose, Bld 79 65 - 99 mg/dL   BUN 14 6 - 20 mg/dL   Creatinine, Ser 0.55 0.44 - 1.00 mg/dL   Calcium 8.3 (L) 8.9 - 10.3 mg/dL   GFR calc non Af Amer >60 >60 mL/min   GFR calc Af Amer >60 >60 mL/min    Comment: (NOTE) The eGFR has been calculated using the CKD EPI equation. This calculation has not been validated in all clinical situations. eGFR's persistently <60  mL/min signify possible Chronic Kidney Disease.    Anion gap 6 5 - 15    Comment: Performed at [facility], [place] [address], [place], [place] [address]  Magnesium     Status: None   Collection Time: 08/20/17  8:34 AM  Result Value Ref Range   Magnesium 1.9 1.7 - 2.4 mg/dL    Comment: Performed at [facility], [place] [address] [place], [place] [address]    Ct Abdomen Pelvis W Contrast  Result Date: 08/19/2017 CLINICAL DATA:  Hemolytic anemia.  Abdominal distension for 4 days. EXAM: CT ABDOMEN AND PELVIS WITH CONTRAST TECHNIQUE: Multidetector CT imaging of the abdomen and pelvis was performed using the standard protocol following bolus administration of intravenous contrast. CONTRAST:  125m ISOVUE-300 IOPAMIDOL (ISOVUE-300) INJECTION 61% COMPARISON:  CT scan 08/17/2017 FINDINGS: Lower chest: Stable 7 mm nodule at the right lung base. No acute pulmonary findings. No pleural effusion. The heart is mildly enlarged but stable. Stable coronary artery calcifications. The distal esophagus is grossly normal. Hepatobiliary: No focal hepatic lesions or intrahepatic biliary dilatation. The gallbladder is normal. No common bile duct dilatation. Pancreas: No mass, inflammation or ductal dilatation. Spleen: Normal size.  No focal lesions. Adrenals/Urinary Tract: Adrenal glands and kidneys are normal. Small renal cysts. No worrisome renal lesions. No hydronephrosis. The bladder is  grossly normal. Stomach/Bowel: The stomach, duodenum and small bowel are grossly normal. There is chronic colonic distension but more so on today's exam. The sigmoid colon is markedly distended and tortuous. I do not see an obvious volvulus. There is a transition zone near where there appear to be surgical changes involving the sigmoid colon. There could be a stricture or intermittent volvulus. Recommend barium enema with water-soluble contrast material. Vascular/Lymphatic: Advanced vascular  calcifications but no focal aneurysm or dissection. The branch vessels are grossly patent. Advanced calcifications are noted. The major venous structures are patent. Reproductive: The uterus and ovaries are grossly normal. Other: No ascites or abdominal wall hernia. Musculoskeletal: No significant bony findings. Severe scoliosis and degenerative lumbar spondylosis. Healed sacral insufficiency fractures and probable healed right pubic bone fracture. IMPRESSION: 1. Markedly distended colon which appears to be an intermittent problem with this patient. There could be a distal stricture or possible intermittent sigmoid volvulus. 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 At some point a water-soluble contrast enema may be helpful for further evaluation. 2. Severe atherosclerotic calcifications involving the aorta and branch vessels. 3. Healed pelvic insufficiency fractures. Electronically Signed   By: Marijo Sanes M.D.   On: 08/19/2017 15:06   Dg Abd Portable 1v  Result Date: 08/20/2017 CLINICAL DATA:  Abdominal distension and nausea. EXAM: PORTABLE ABDOMEN - 1 VIEW COMPARISON:  CT abdomen pelvis 08/19/2017. FINDINGS: Mild gaseous distension of small bowel with marked gaseous distension of the transverse colon. No unexpected radiopaque calculi. Contrast is seen in the bladder. Scoliosis. IMPRESSION: Persistent gaseous distension of the transverse colon. Electronically Signed   By: Lorin Picket M.D.   On: 08/20/2017 08:15    Review of Systems  Constitutional: Negative for chills and fever.  HENT: Positive for hearing loss.   Eyes: Negative for blurred vision.  Respiratory: Negative for cough and shortness of breath.   Cardiovascular: Negative for chest pain and palpitations.  Gastrointestinal: Positive for abdominal pain, diarrhea and nausea.  Genitourinary: Negative for dysuria, frequency and urgency.  Skin: Negative for itching and rash.   Blood pressure 114/71, pulse 92, temperature 98.9 F (37.2 C),  temperature source Oral, resp. rate 18, height '5\' 3"'$  (1.6 m), weight 55.3 kg (122 lb), SpO2 96 %. Physical Exam  Constitutional: She is oriented to person, place, and time. She appears well-developed and well-nourished.  HENT:  Head: Normocephalic and atraumatic.  Eyes: Conjunctivae and EOM are normal. Pupils are equal, round, and reactive to light.  Neck: Normal range of motion. Neck supple.  Cardiovascular: Normal rate and regular rhythm.  Respiratory: Effort normal and breath sounds normal. No respiratory distress.  GI: Soft. She exhibits distension. There is no tenderness.  Musculoskeletal: Normal range of motion.  Neurological: She is alert and oriented to person, place, and time.    Assessment/Plan: Patient has acute onset diarrhea and colonic distention.  I see no signs of colonic volvulus.  This is most likely from an infectious etiology or medication related.  She has no surgical issues.  If patient did have a colonoscopy 1 year ago that was normal, I doubt this would be caused by an obstructing lesion.  Will sign off please call with any questions or concerns.  Consider GI follow-up as an outpatient.  Dawayne Ohair C. 1/68/3729, 02:11 AM

## 2017-08-20 NOTE — Progress Notes (Signed)
CRITICAL VALUE ALERT  Critical Value:  K+ 2.7  Date & Time Notied:  08/20/2017 @1003   Provider Notified: MD paged  08/20/2017 @1005 . Awaiting call and/or orders.  Orders Received/Actions taken: See new orders in Epic per MD

## 2017-08-21 DIAGNOSIS — R1084 Generalized abdominal pain: Secondary | ICD-10-CM

## 2017-08-21 DIAGNOSIS — R110 Nausea: Secondary | ICD-10-CM

## 2017-08-21 LAB — CBC
HCT: 20.6 % — ABNORMAL LOW (ref 36.0–46.0)
HEMOGLOBIN: 7.2 g/dL — AB (ref 12.0–15.0)
MCH: 35 pg — AB (ref 26.0–34.0)
MCHC: 35 g/dL (ref 30.0–36.0)
MCV: 100 fL (ref 78.0–100.0)
Platelets: 151 10*3/uL (ref 150–400)
RBC: 2.06 MIL/uL — AB (ref 3.87–5.11)
RDW: 18 % — ABNORMAL HIGH (ref 11.5–15.5)
WBC: 3.3 10*3/uL — ABNORMAL LOW (ref 4.0–10.5)

## 2017-08-21 LAB — BASIC METABOLIC PANEL
Anion gap: 7 (ref 5–15)
BUN: 12 mg/dL (ref 6–20)
CHLORIDE: 100 mmol/L — AB (ref 101–111)
CO2: 25 mmol/L (ref 22–32)
CREATININE: 0.5 mg/dL (ref 0.44–1.00)
Calcium: 8.3 mg/dL — ABNORMAL LOW (ref 8.9–10.3)
GFR calc Af Amer: >60 mL/min (ref >60)
GFR calc non Af Amer: >60 mL/min (ref >60)
GLUCOSE: 167 mg/dL — AB (ref 65–99)
POTASSIUM: 4.4 mmol/L (ref 3.5–5.1)
SODIUM: 132 mmol/L — AB (ref 135–145)

## 2017-08-21 LAB — GASTROINTESTINAL PANEL BY PCR, STOOL (REPLACES STOOL CULTURE)
ADENOVIRUS F40/41: NOT DETECTED
Astrovirus: NOT DETECTED
CRYPTOSPORIDIUM: NOT DETECTED
CYCLOSPORA CAYETANENSIS: NOT DETECTED
Campylobacter species: NOT DETECTED
ENTEROAGGREGATIVE E COLI (EAEC): NOT DETECTED
ENTEROPATHOGENIC E COLI (EPEC): NOT DETECTED
Entamoeba histolytica: NOT DETECTED
Enterotoxigenic E coli (ETEC): NOT DETECTED
GIARDIA LAMBLIA: NOT DETECTED
Norovirus GI/GII: NOT DETECTED
PLESIMONAS SHIGELLOIDES: NOT DETECTED
Rotavirus A: NOT DETECTED
SALMONELLA SPECIES: NOT DETECTED
Sapovirus (I, II, IV, and V): NOT DETECTED
Shiga like toxin producing E coli (STEC): NOT DETECTED
Shigella/Enteroinvasive E coli (EIEC): NOT DETECTED
Vibrio cholerae: NOT DETECTED
Vibrio species: NOT DETECTED
YERSINIA ENTEROCOLITICA: NOT DETECTED

## 2017-08-21 LAB — MAGNESIUM: MAGNESIUM: 2.1 mg/dL (ref 1.7–2.4)

## 2017-08-21 MED ORDER — SODIUM CHLORIDE 0.9 % IV SOLN: Freq: Once | INTRAVENOUS | Status: DC

## 2017-08-21 MED ORDER — DIPHENHYDRAMINE HCL 50 MG/ML IJ SOLN
25.0000 mg | Freq: Four times a day (QID) | INTRAMUSCULAR | Status: DC | PRN
Start: 2017-08-21 — End: 2017-08-21

## 2017-08-21 NOTE — Discharge Summary (Signed)
Beverly Lawrence ZOX:[id number] DOB: 1945/08/13 DOA: 08/19/2017  PCP: Malka So., MD  Admit date: 08/19/2017  Discharge date: 08/21/2017  Admitted From: Home   Disposition:  Home   Recommendations for Outpatient Follow-up:   Follow up with PCP in 1-2 weeks  PCP Please obtain BMP/CBC, 2 view CXR in 1week,  (see Discharge instructions)   PCP Please follow up on the following pending results: None   Home Health: PT, RN   Equipment/Devices: Has a walker  Consultations: CCS Discharge Condition: Fair   CODE STATUS: Full   Diet Recommendation: Soft diet for 3 more days then gradually advance to regular consistency heart healthy diet as tolerated *   Chief Complaint  Patient presents with  . 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 No relation to food and was seen on Tuesday at [facility] and today on [facility] for the same complaint. She feels that the pain is worsening with increase in distention .Patient denies any fever or chills. Denies any cough                                                                   Hospital Course    1.  Abdominal pain.  Likely related to gastroenteritis, abdominal exam unremarkable, clinically there was no volvulus, CT scan findings most likely more secondary to gastroenteritis, her diarrheal illness has mostly improved, she had no abdominal pain or nausea, she was seen by general surgery and they agreed that her changes were due to gastroenteritis.   She has a benign abdominal exam and symptom-free, will be discharged on a soft diet for the next 2-3 days, she ruled out for C. difficile and her diarrheal illness is much improved as well.  She will continue to get home PT and RN as before.  Follow with PCP within a week.    2.  History of DVT.  On Eliquis continue.  3.  Remote history of autoimmune hemolytic anemia.  No acute issues supportive care.  4.  Hypokalemia.  Replaced will monitor potassium and magnesium levels.  5.  Anemia of chronic disease.  Worse due to him dilution from IV fluids, she did not want transfusions, she was told by her hematologist not to do so likely due to underlying history of autoimmune hemolytic anemia, currently hemoglobin above 7 and she is symptom-free.  Stop IV fluids, resume home dose Lasix upon discharge and follow with PCP.    Discharge diagnosis     Active Problems:  Volvulus (HCC)   Hypertension   Autoimmune hemolytic anemia (HCC)   DVT (deep venous thrombosis) (HCC)   Generalized abdominal pain   Nausea    Discharge instructions    Discharge Instructions    Discharge instructions   Complete by:  As directed    Follow with Primary MD Malka So., MD in 7 days   Get CBC, CMP, checked  by Primary MD  in 5-7 days   Activity: As tolerated with Full fall precautions use walker/cane & assistance as needed  Disposition Home    Diet:   Soft diet for 3 more days then gradually advance to regular consistency heart healthy diet as tolerated  For Heart failure patients - Check your Weight same time everyday, if you gain over 2 pounds, or you develop in leg swelling, experience more shortness of breath or chest pain, call your Primary MD immediately. Follow Cardiac Low Salt Diet and 1.5 lit/day fluid restriction.  Special Instructions: If you have smoked or chewed Tobacco  in the last 2 yrs please stop smoking, stop any regular Alcohol  and or any Recreational drug use.  On your next  visit with your primary care physician please Get Medicines reviewed and adjusted.  Please request your Prim.MD to go over all Hospital Tests and Procedure/Radiological results at the follow up, please get all Hospital records sent to your Prim MD by signing hospital release before you go home.  If you experience worsening of your admission symptoms, develop shortness of breath, life threatening emergency, suicidal or homicidal thoughts you must seek medical attention immediately by calling 911 or calling your MD immediately  if symptoms less severe.  You Must read complete instructions/literature along with all the possible adverse reactions/side effects for all the Medicines you take and that have been prescribed to you. Take any new Medicines after you have completely understood and accpet all the possible adverse reactions/side effects.   Increase activity slowly   Complete by:  As directed       Discharge Medications   Allergies as of 08/21/2017      Reactions   Lisinopril Swelling   Facial swelling   Codeine Other (See Comments)   unknown   Erythromycin Other (See Comments)   unknown   Morphine And Related Other (See Comments)   unknown   Penicillins Hives, Other (See Comments)   Has patient had a PCN reaction causing immediate rash, facial/tongue/throat swelling, SOB or lightheadedness with hypotension: No Has patient had a PCN reaction causing severe rash involving mucus membranes or skin necrosis: No Has patient had a PCN reaction that required hospitalization No Has patient had a PCN reaction occurring within the last 10 years: No If all of the above answers are "NO", then may proceed with Cephalosporin use.   Percocet [oxycodone-acetaminophen] Other (See Comments)   unknown   Percodan [oxycodone-aspirin] Other (See Comments)   unknown      Medication List    TAKE these medications   amLODipine 10 MG tablet Commonly known as:  NORVASC Take 10 mg by mouth daily.     diltiazem 180 MG 24 hr capsule Commonly known as:  DILACOR XR Take 180 mg by mouth daily.   ELIQUIS 5 MG Tabs tablet Generic drug:  apixaban Take 5 mg by mouth 2 (two) times daily.   folic acid 1 MG tablet Commonly known as:  FOLVITE Take 1 mg by mouth 2 (two) times daily.   furosemide 20 MG tablet Commonly known as:  LASIX   HYDROcodone-acetaminophen 5-325 MG tablet Commonly known as:  NORCO/VICODIN Take 1-2 tablets by mouth every 4 (four) hours as needed for moderate pain or severe pain.   labetalol 100 MG tablet Commonly known as:  NORMODYNE Take 100 mg by mouth 2 (two) times daily.   ondansetron 4 MG disintegrating tablet Commonly known as:  ZOFRAN-ODT Take 1 tablet (4 mg total) by mouth every 6 (six) hours as needed for nausea, vomiting or refractory nausea / vomiting.   oxybutynin 5 MG 24 hr tablet Commonly known as:  DITROPAN-XL Take 5 mg by mouth at bedtime.   pantoprazole 40 MG tablet Commonly known as:  PROTONIX Take 40 mg by mouth 2 (two) times daily.   Potassium Chloride ER 20 MEQ Tbcr TK 1 T PO D   predniSONE 10 MG tablet Commonly known as:  DELTASONE Take 20 mg by mouth 3 (three) times daily.   promethazine 12.5 MG tablet Commonly known as:  PHENERGAN Take 12.5 mg by mouth every 6 (six) hours as needed for nausea or vomiting.   zolpidem 5 MG tablet Commonly known as:  AMBIEN Take 5 mg by mouth at bedtime as needed for sleep.       Follow-up Information    Malka SoJobe, Daniel B., MD. Schedule an appointment as soon as possible for a visit in 1 week(s).   Specialty:  Internal Medicine          Major procedures and Radiology Reports - PLEASE review detailed and final reports thoroughly  -        Ct Abdomen Pelvis W Contrast  Result Date: 08/19/2017 CLINICAL DATA:  Hemolytic anemia.  Abdominal distension for 4 days. EXAM: CT ABDOMEN AND PELVIS WITH CONTRAST TECHNIQUE: Multidetector CT imaging of the abdomen and pelvis was performed using the  standard protocol following bolus administration of intravenous contrast. CONTRAST:  100mL ISOVUE-300 IOPAMIDOL (ISOVUE-300) INJECTION 61% COMPARISON:  CT scan 08/17/2017 FINDINGS: Lower chest: Stable 7 mm nodule at the right lung base. No acute pulmonary findings. No pleural effusion. The heart is mildly enlarged but stable. Stable coronary artery calcifications. The distal esophagus is grossly normal. Hepatobiliary: No focal hepatic lesions or intrahepatic biliary dilatation. The gallbladder is normal. No common bile duct dilatation. Pancreas: No mass, inflammation or ductal dilatation. Spleen: Normal size.  No focal lesions. Adrenals/Urinary Tract: Adrenal glands and kidneys are normal. Small renal cysts. No worrisome renal lesions. No hydronephrosis. The bladder is grossly normal. Stomach/Bowel: The stomach, duodenum and small bowel are grossly normal. There is chronic colonic distension but more so on today's exam. The sigmoid colon is markedly distended and tortuous. I do not see an obvious volvulus. There is a transition zone near where there appear to be surgical changes involving the sigmoid colon. There could be a stricture or intermittent volvulus. Recommend barium enema with water-soluble contrast material. Vascular/Lymphatic: Advanced vascular calcifications but no focal aneurysm or dissection. The branch vessels are grossly patent. Advanced calcifications are noted. The major venous structures are patent. Reproductive: The uterus and ovaries are grossly normal. Other: No ascites or abdominal wall hernia. Musculoskeletal: No significant bony findings. Severe scoliosis and degenerative lumbar spondylosis. Healed sacral insufficiency fractures and probable healed right pubic bone fracture. IMPRESSION: 1. Markedly distended colon which appears to be an intermittent problem with this patient. There could be a distal stricture or possible intermittent sigmoid volvulus. 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 At some  point a water-soluble contrast enema may be helpful for further evaluation. 2. Severe atherosclerotic  calcifications involving the aorta and branch vessels. 3. Healed pelvic insufficiency fractures. Electronically Signed   By: Rudie Meyer M.D.   On: 08/19/2017 15:06   Dg Abd Portable 1v  Result Date: 08/20/2017 CLINICAL DATA:  Abdominal distension and nausea. EXAM: PORTABLE ABDOMEN - 1 VIEW COMPARISON:  CT abdomen pelvis 08/19/2017. FINDINGS: Mild gaseous distension of small bowel with marked gaseous distension of the transverse colon. No unexpected radiopaque calculi. Contrast is seen in the bladder. Scoliosis. IMPRESSION: Persistent gaseous distension of the transverse colon. Electronically Signed   By: Leanna Battles M.D.   On: 08/20/2017 08:15    Micro Results     Recent Results (from the past 240 hour(s))  C difficile quick scan w PCR reflex     Status: None   Collection Time: 08/20/17 11:58 AM  Result Value Ref Range Status   C Diff antigen NEGATIVE NEGATIVE Final   C Diff toxin NEGATIVE NEGATIVE Final   C Diff interpretation No C. difficile detected.  Final    Comment: Performed at [facility], [address] [place], [place] [address]    Today   Subjective    Beverly Lawrence today has no headache,no chest abdominal pain,no new weakness tingling or numbness, feels much better wants to go home today.     Objective   Blood pressure (!) 105/54, pulse 78, temperature 98.7 F (37.1 C), temperature source Oral, resp. rate 20, height 5\' 3"  (1.6 m), weight 55.3 kg (122 lb), SpO2 100 %.   Intake/Output Summary (Last 24 hours) at 08/21/2017 1137 Last data filed at 08/21/2017 1110 Gross per 24 hour  Intake 760 ml  Output 350 ml  Net 410 ml    Exam Awake Alert, Oriented x 3, No new F.N deficits, Normal affect [place].AT,PERRAL Supple Neck,No JVD, No cervical lymphadenopathy appriciated.  Symmetrical Chest wall movement, Good air movement bilaterally, CTAB RRR,No  Gallops,Rubs or new Murmurs, No Parasternal Heave +ve B.Sounds, Abd Soft, Non tender, No organomegaly appriciated, No rebound -guarding or rigidity. No Cyanosis, Clubbing or edema, No new Rash or bruise   Data Review   CBC w Diff:  Lab Results  Component Value Date   WBC 3.3 (L) 08/21/2017   HGB 7.2 (L) 08/21/2017   HCT 20.6 (L) 08/21/2017   PLT 151 08/21/2017   LYMPHOPCT 15 08/20/2014   MONOPCT 5 08/20/2014   EOSPCT 1 08/20/2014   BASOPCT 1 08/20/2014    CMP:  Lab Results  Component Value Date   NA 132 (L) 08/21/2017   K 4.4 08/21/2017   CL 100 (L) 08/21/2017   CO2 25 08/21/2017   BUN 12 08/21/2017   CREATININE 0.50 08/21/2017   PROT 6.1 (L) 08/19/2017   ALBUMIN 3.5 08/19/2017   BILITOT 2.0 (H) 08/19/2017   ALKPHOS 70 08/19/2017   AST 23 08/19/2017   ALT 21 08/19/2017  .   Total Time in preparing paper work, data evaluation and todays exam - 35 minutes  Susa Raring M.D on 08/21/2017 at 11:37 AM  [facility]   Office  [phone]

## 2017-08-21 NOTE — Care Management Note (Addendum)
Case Management Note  Patient Details  Name: Beverly BailiffRita M Lawrence MRN: [id number] Date of Birth: 1945/07/18  Subjective/Objective:    Abdominal pain, hypokalemia                Action/Plan: Spoke to pt and she is active with [facility] for HHPT/OT. States she has RW, wheelchair, and cane at home. Husband is at home to assist with care. Will fax orders/[id number], facesheet, dc summary to [facility]. Spoke to RootsAnnette, Stage manageroncall RN and pt is active.   Expected Discharge Date:  08/21/17               Expected Discharge Plan:  Home w Home Health Services  In-House Referral:  NA  Discharge planning Services  CM Consult  Post Acute Care Choice:  Home Health Choice offered to:  Patient  DME Arranged:  N/A DME Agency:  NA  HH Arranged:  PT, RN HH Agency:  [facility]  Status of Service:  Completed, signed off  If discussed at Long Length of Stay Meetings, dates discussed:    Additional Comments:  Elliot CousinShavis, Malli Falotico Ellen, RN 08/21/2017, 12:55 PM

## 2017-08-21 NOTE — Evaluation (Signed)
Physical Therapy Evaluation Patient Details Name: Beverly Lawrence MRN: [id number] DOB: 10-02-1945 Today's Date: 08/21/2017   History of Present Illness  Pt admitted with ABD pain and distention and diarhhea.  Pt with hx of TIA, CHF and autoimmune Hemolytic anemia.  Clinical Impression  Pt admitted as above and presenting with functional mobility limitations 2* generalized weakness, ltd endurance and mild ambulatory balance deficits.  Pt would benefit from follow up HHPT to maximize IND and safety.  Pt advises she had just started with [facility] prior to admission and would like to continue with this provider.    Follow Up Recommendations Home health PT    Equipment Recommendations  None recommended by PT    Recommendations for Other Services       Precautions / Restrictions Precautions Precautions: Fall Restrictions Weight Bearing Restrictions: No      Mobility  Bed Mobility Overal bed mobility: Modified Independent                Transfers Overall transfer level: Modified independent Equipment used: Rolling walker (2 wheeled)                Ambulation/Gait Ambulation/Gait assistance: Supervision Ambulation Distance (Feet): 222 Feet Assistive device: Rolling walker (2 wheeled) Gait Pattern/deviations: Step-through pattern;Decreased step length - right;Decreased step length - left;Shuffle;Trunk flexed Gait velocity: mod pace   General Gait Details: Pt self cueing for posture and position from [employer]            Wheelchair Mobility    Modified Rankin (Stroke Patients Only)       Balance Overall balance assessment: Needs assistance Sitting-balance support: No upper extremity supported;Feet supported Sitting balance-Leahy Scale: Good     Standing balance support: No upper extremity supported Standing balance-Leahy Scale: Fair                               Pertinent Vitals/Pain Pain Assessment: No/denies pain    Home  Living Family/patient expects to be discharged to:: Private residence Living Arrangements: Spouse/significant other Available Help at Discharge: Family Type of Home: House       Home Layout: Able to live on main level with bedroom/bathroom Home Equipment: Dan Humphreys - 2 wheels;Walker - 4 wheels;Cane - single point;Cane - quad;Shower seat;Bedside commode;Wheelchair - manual      Prior Function Level of Independence: Independent with assistive device(s)         Comments: Pt uses RW     Hand Dominance        Extremity/Trunk Assessment   Upper Extremity Assessment Upper Extremity Assessment: Generalized weakness    Lower Extremity Assessment Lower Extremity Assessment: Generalized weakness    Cervical / Trunk Assessment Cervical / Trunk Assessment: Kyphotic  Communication   Communication: No difficulties  Cognition Arousal/Alertness: Awake/alert Behavior During Therapy: WFL for tasks assessed/performed Overall Cognitive Status: Within Functional Limits for tasks assessed                                        General Comments      Exercises     Assessment/Plan    PT Assessment Patient needs continued PT services  PT Problem List Decreased strength;Decreased activity tolerance;Decreased balance       PT Treatment Interventions DME instruction;Gait training;Stair training;Functional mobility training;Therapeutic activities;Therapeutic exercise;Patient/family education    PT Goals (Current  goals can be found in the Care Plan section)  Acute Rehab PT Goals Patient Stated Goal: HOME PT Goal Formulation: With patient Time For Goal Achievement: 09/04/17 Potential to Achieve Goals: Good    Frequency Min 3X/week   Barriers to discharge        Co-evaluation               AM-PAC PT "6 Clicks" Daily Activity  Outcome Measure Difficulty turning over in bed (including adjusting bedclothes, sheets and blankets)?: A Little Difficulty moving  from lying on back to sitting on the side of the bed? : A Lot Difficulty sitting down on and standing up from a chair with arms (e.g., wheelchair, bedside commode, etc,.)?: A Little Help needed moving to and from a bed to chair (including a wheelchair)?: A Little Help needed walking in hospital room?: A Little Help needed climbing 3-5 steps with a railing? : A Little 6 Click Score: 17    End of Session Equipment Utilized During Treatment: Gait belt Activity Tolerance: Patient tolerated treatment well;Patient limited by fatigue Patient left: in chair;with call bell/phone within reach;with chair alarm set Nurse Communication: Mobility status PT Visit Diagnosis: Muscle weakness (generalized) (M62.81);Difficulty in walking, not elsewhere classified (R26.2)    Time: 1610-9604 PT Time Calculation (min) (ACUTE ONLY): 17 min   Charges:   PT Evaluation $PT Eval Low Complexity: 1 Low     PT G Codes:        Pg [phone]   Lotus Gover 08/21/2017, 1:06 PM

## 2017-08-21 NOTE — Discharge Instructions (Signed)
Follow with Primary MD Malka SoJobe, Daniel B., MD in 7 days   Get CBC, CMP, checked  by Primary MD  in 5-7 days   Activity: As tolerated with Full fall precautions use walker/cane & assistance as needed  Disposition Home    Diet:   Soft diet for 3 more days then gradually advance to regular consistency heart healthy diet as tolerated  For Heart failure patients - Check your Weight same time everyday, if you gain over 2 pounds, or you develop in leg swelling, experience more shortness of breath or chest pain, call your Primary MD immediately. Follow Cardiac Low Salt Diet and 1.5 lit/day fluid restriction.  Special Instructions: If you have smoked or chewed Tobacco  in the last 2 yrs please stop smoking, stop any regular Alcohol  and or any Recreational drug use.  On your next visit with your primary care physician please Get Medicines reviewed and adjusted.  Please request your Prim.MD to go over all Hospital Tests and Procedure/Radiological results at the follow up, please get all Hospital records sent to your Prim MD by signing hospital release before you go home.  If you experience worsening of your admission symptoms, develop shortness of breath, life threatening emergency, suicidal or homicidal thoughts you must seek medical attention immediately by calling 911 or calling your MD immediately  if symptoms less severe.  You Must read complete instructions/literature along with all the possible adverse reactions/side effects for all the Medicines you take and that have been prescribed to you. Take any new Medicines after you have completely understood and accpet all the possible adverse reactions/side effects.

## 2019-07-06 DIAGNOSIS — R601 Generalized edema: Secondary | ICD-10-CM

## 2019-07-06 HISTORY — DX: Generalized edema: R60.1

## 2020-03-07 ENCOUNTER — Other Ambulatory Visit: Payer: Self-pay

## 2020-03-07 ENCOUNTER — Inpatient Hospital Stay (HOSPITAL_COMMUNITY)
Admission: EM | Admit: 2020-03-07 | Discharge: 2020-03-20 | DRG: 644 | Disposition: A | Discharge status: Home Health | Payer: Medicare Other | Attending: Internal Medicine | Admitting: Internal Medicine

## 2020-03-07 ENCOUNTER — Emergency Department (HOSPITAL_COMMUNITY): Payer: Medicare Other

## 2020-03-07 ENCOUNTER — Encounter (HOSPITAL_COMMUNITY): Payer: Self-pay

## 2020-03-07 DIAGNOSIS — F419 Anxiety disorder, unspecified: Secondary | ICD-10-CM | POA: Diagnosis present

## 2020-03-07 DIAGNOSIS — Z8673 Personal history of transient ischemic attack (TIA), and cerebral infarction without residual deficits: Secondary | ICD-10-CM

## 2020-03-07 DIAGNOSIS — R109 Unspecified abdominal pain: Secondary | ICD-10-CM

## 2020-03-07 DIAGNOSIS — Z87891 Personal history of nicotine dependence: Secondary | ICD-10-CM

## 2020-03-07 DIAGNOSIS — L03115 Cellulitis of right lower limb: Secondary | ICD-10-CM | POA: Diagnosis present

## 2020-03-07 DIAGNOSIS — Z9049 Acquired absence of other specified parts of digestive tract: Secondary | ICD-10-CM

## 2020-03-07 DIAGNOSIS — I10 Essential (primary) hypertension: Secondary | ICD-10-CM | POA: Diagnosis present

## 2020-03-07 DIAGNOSIS — G459 Transient cerebral ischemic attack, unspecified: Secondary | ICD-10-CM | POA: Diagnosis not present

## 2020-03-07 DIAGNOSIS — D693 Immune thrombocytopenic purpura: Secondary | ICD-10-CM | POA: Diagnosis present

## 2020-03-07 DIAGNOSIS — K567 Ileus, unspecified: Secondary | ICD-10-CM

## 2020-03-07 DIAGNOSIS — E162 Hypoglycemia, unspecified: Secondary | ICD-10-CM | POA: Diagnosis present

## 2020-03-07 DIAGNOSIS — D5911 Warm autoimmune hemolytic anemia: Secondary | ICD-10-CM | POA: Diagnosis present

## 2020-03-07 DIAGNOSIS — E861 Hypovolemia: Secondary | ICD-10-CM | POA: Diagnosis present

## 2020-03-07 DIAGNOSIS — I5032 Chronic diastolic (congestive) heart failure: Secondary | ICD-10-CM | POA: Diagnosis present

## 2020-03-07 DIAGNOSIS — R29810 Facial weakness: Secondary | ICD-10-CM | POA: Diagnosis present

## 2020-03-07 DIAGNOSIS — E222 Syndrome of inappropriate secretion of antidiuretic hormone: Secondary | ICD-10-CM | POA: Diagnosis not present

## 2020-03-07 DIAGNOSIS — K529 Noninfective gastroenteritis and colitis, unspecified: Secondary | ICD-10-CM | POA: Diagnosis present

## 2020-03-07 DIAGNOSIS — Z79899 Other long term (current) drug therapy: Secondary | ICD-10-CM

## 2020-03-07 DIAGNOSIS — Z885 Allergy status to narcotic agent status: Secondary | ICD-10-CM

## 2020-03-07 DIAGNOSIS — I480 Paroxysmal atrial fibrillation: Secondary | ICD-10-CM | POA: Diagnosis present

## 2020-03-07 DIAGNOSIS — R197 Diarrhea, unspecified: Secondary | ICD-10-CM

## 2020-03-07 DIAGNOSIS — R4182 Altered mental status, unspecified: Secondary | ICD-10-CM | POA: Diagnosis not present

## 2020-03-07 DIAGNOSIS — E871 Hypo-osmolality and hyponatremia: Secondary | ICD-10-CM | POA: Diagnosis present

## 2020-03-07 DIAGNOSIS — E2740 Unspecified adrenocortical insufficiency: Secondary | ICD-10-CM | POA: Diagnosis present

## 2020-03-07 DIAGNOSIS — I110 Hypertensive heart disease with heart failure: Secondary | ICD-10-CM | POA: Diagnosis present

## 2020-03-07 DIAGNOSIS — K580 Irritable bowel syndrome with diarrhea: Secondary | ICD-10-CM

## 2020-03-07 DIAGNOSIS — I5030 Unspecified diastolic (congestive) heart failure: Secondary | ICD-10-CM | POA: Diagnosis present

## 2020-03-07 DIAGNOSIS — L03116 Cellulitis of left lower limb: Secondary | ICD-10-CM | POA: Diagnosis present

## 2020-03-07 DIAGNOSIS — D61818 Other pancytopenia: Secondary | ICD-10-CM | POA: Diagnosis present

## 2020-03-07 DIAGNOSIS — D591 Autoimmune hemolytic anemia, unspecified: Secondary | ICD-10-CM | POA: Diagnosis present

## 2020-03-07 DIAGNOSIS — I700 Atherosclerosis of aorta: Secondary | ICD-10-CM | POA: Diagnosis present

## 2020-03-07 DIAGNOSIS — Z20822 Contact with and (suspected) exposure to covid-19: Secondary | ICD-10-CM | POA: Diagnosis present

## 2020-03-07 DIAGNOSIS — Z888 Allergy status to other drugs, medicaments and biological substances status: Secondary | ICD-10-CM

## 2020-03-07 DIAGNOSIS — K219 Gastro-esophageal reflux disease without esophagitis: Secondary | ICD-10-CM | POA: Diagnosis present

## 2020-03-07 DIAGNOSIS — Z95828 Presence of other vascular implants and grafts: Secondary | ICD-10-CM

## 2020-03-07 DIAGNOSIS — E876 Hypokalemia: Secondary | ICD-10-CM | POA: Diagnosis not present

## 2020-03-07 DIAGNOSIS — R159 Full incontinence of feces: Secondary | ICD-10-CM | POA: Diagnosis present

## 2020-03-07 DIAGNOSIS — I4892 Unspecified atrial flutter: Secondary | ICD-10-CM | POA: Diagnosis present

## 2020-03-07 DIAGNOSIS — Z86718 Personal history of other venous thrombosis and embolism: Secondary | ICD-10-CM

## 2020-03-07 DIAGNOSIS — Z7901 Long term (current) use of anticoagulants: Secondary | ICD-10-CM

## 2020-03-07 DIAGNOSIS — E8770 Fluid overload, unspecified: Secondary | ICD-10-CM | POA: Diagnosis present

## 2020-03-07 DIAGNOSIS — Z880 Allergy status to penicillin: Secondary | ICD-10-CM

## 2020-03-07 DIAGNOSIS — Z7952 Long term (current) use of systemic steroids: Secondary | ICD-10-CM

## 2020-03-07 LAB — CBC
HCT: 27.2 % — ABNORMAL LOW (ref 36.0–46.0)
Hemoglobin: 9.5 g/dL — ABNORMAL LOW (ref 12.0–15.0)
MCH: 38.6 pg — ABNORMAL HIGH (ref 26.0–34.0)
MCHC: 34.9 g/dL (ref 30.0–36.0)
MCV: 110.6 fL — ABNORMAL HIGH (ref 80.0–100.0)
Platelets: 163 10*3/uL (ref 150–400)
RBC: 2.46 MIL/uL — ABNORMAL LOW (ref 3.87–5.11)
RDW: 13.6 % (ref 11.5–15.5)
WBC: 3 10*3/uL — ABNORMAL LOW (ref 4.0–10.5)
nRBC: 0 % (ref 0.0–0.2)

## 2020-03-07 LAB — I-STAT CHEM 8, ED
BUN: 5 mg/dL — ABNORMAL LOW (ref 8–23)
Calcium, Ion: 0.99 mmol/L — ABNORMAL LOW (ref 1.15–1.40)
Chloride: 78 mmol/L — ABNORMAL LOW (ref 98–111)
Creatinine, Ser: 0.5 mg/dL (ref 0.44–1.00)
Glucose, Bld: 60 mg/dL — ABNORMAL LOW (ref 70–99)
HCT: 29 % — ABNORMAL LOW (ref 36.0–46.0)
Hemoglobin: 9.9 g/dL — ABNORMAL LOW (ref 12.0–15.0)
Potassium: 4.6 mmol/L (ref 3.5–5.1)
Sodium: 118 mmol/L — CL (ref 135–145)
TCO2: 28 mmol/L (ref 22–32)

## 2020-03-07 LAB — COMPREHENSIVE METABOLIC PANEL
ALT: 17 U/L (ref 0–44)
AST: 41 U/L (ref 15–41)
Albumin: 3.3 g/dL — ABNORMAL LOW (ref 3.5–5.0)
Alkaline Phosphatase: 88 U/L (ref 38–126)
Anion gap: 14 (ref 5–15)
BUN: 5 mg/dL — ABNORMAL LOW (ref 8–23)
CO2: 26 mmol/L (ref 22–32)
Calcium: 8.3 mg/dL — ABNORMAL LOW (ref 8.9–10.3)
Chloride: 79 mmol/L — ABNORMAL LOW (ref 98–111)
Creatinine, Ser: 0.58 mg/dL (ref 0.44–1.00)
GFR calc Af Amer: >60 mL/min (ref >60)
GFR calc non Af Amer: >60 mL/min (ref >60)
Glucose, Bld: 62 mg/dL — ABNORMAL LOW (ref 70–99)
Potassium: 4.5 mmol/L (ref 3.5–5.1)
Sodium: 119 mmol/L — CL (ref 135–145)
Total Bilirubin: 2.4 mg/dL — ABNORMAL HIGH (ref 0.3–1.2)
Total Protein: 6.2 g/dL — ABNORMAL LOW (ref 6.5–8.1)

## 2020-03-07 LAB — DIFFERENTIAL
Abs Immature Granulocytes: 0 10*3/uL (ref 0.00–0.07)
Basophils Absolute: 0 10*3/uL (ref 0.0–0.1)
Basophils Relative: 1 %
Eosinophils Absolute: 0 10*3/uL (ref 0.0–0.5)
Eosinophils Relative: 0 %
Lymphocytes Relative: 20 %
Lymphs Abs: 0.6 10*3/uL — ABNORMAL LOW (ref 0.7–4.0)
Monocytes Absolute: 0.2 10*3/uL (ref 0.1–1.0)
Monocytes Relative: 8 %
Neutro Abs: 2.1 10*3/uL (ref 1.7–7.7)
Neutrophils Relative %: 71 %

## 2020-03-07 LAB — CBG MONITORING, ED: Glucose-Capillary: 61 mg/dL — ABNORMAL LOW (ref 70–99)

## 2020-03-07 LAB — PROTIME-INR
INR: 1.2 (ref 0.8–1.2)
Prothrombin Time: 15 seconds (ref 11.4–15.2)

## 2020-03-07 LAB — APTT: aPTT: 39 seconds — ABNORMAL HIGH (ref 24–36)

## 2020-03-07 LAB — ETHANOL: Alcohol, Ethyl (B): 57 mg/dL — ABNORMAL HIGH (ref <10)

## 2020-03-07 MED ORDER — SODIUM CHLORIDE 0.9% FLUSH: 3.0000 mL | INTRAVENOUS | Status: DC | PRN

## 2020-03-07 MED ORDER — IOHEXOL 350 MG/ML SOLN
50.0000 mL | Freq: Once | INTRAVENOUS | Status: AC | PRN
  Administered 2020-03-07: 50 mL via INTRAVENOUS

## 2020-03-07 MED ORDER — SODIUM CHLORIDE 0.9 % IV SOLN: 250.0000 mL | INTRAVENOUS | Status: DC | PRN

## 2020-03-07 MED ORDER — SODIUM CHLORIDE 0.9% FLUSH
3.0000 mL | Freq: Two times a day (BID) | INTRAVENOUS | Status: DC
  Administered 2020-03-08 – 2020-03-15 (×12): 3 mL via INTRAVENOUS

## 2020-03-07 MED ORDER — ONDANSETRON HCL 4 MG/2ML IJ SOLN
4.0000 mg | Freq: Once | INTRAMUSCULAR | Status: AC
  Administered 2020-03-08: 4 mg via INTRAVENOUS
  Filled 2020-03-07: qty 2

## 2020-03-07 MED ORDER — DEXTROSE 50 % IV SOLN
1.0000 | Freq: Once | INTRAVENOUS | Status: AC
  Administered 2020-03-07: 50 mL via INTRAVENOUS
  Filled 2020-03-07: qty 50

## 2020-03-07 NOTE — Code Documentation (Signed)
Stroke Response Nurse Documentation Code Documentation  Beverly Lawrence is a 74 y.o. female arriving to [facility] ED via [facility] on 9/3 with past medical hx of TIA, GIB. Code stroke was activated by EMS. Patient from home where she was LKW at 1900 and now complaining of left sided weakness and inability to smile . On No antithrombotic due to GIB. Stroke team at the bedside on patient arrival. Labs drawn and patient cleared for CT by Dr. Pilar Plate. Patient to CT with team. NIHSS 1, see documentation for details and code stroke times. Patient with left decreased sensation on exam. The following imaging was completed:  CTA. Patient is not a candidate for tPA due to mild symptoms Care/Plan. Bedside handoff with ED RN.    Rose Fillers  Rapid Response RN

## 2020-03-07 NOTE — Consult Note (Signed)
Neurology Consultation Reason for Consult: Left-sided weakness Referring Physician: Randol Kern  CC: Left-sided weakness  History is obtained from: Patient  HPI: Beverly Lawrence is a 74 y.o. female with a history of (self-reported)atrial fibrillation who is not on anticoagulation due to a GI bleed in 2019.  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  She was in her normal state of health until 7 PM which point she began noticing left-sided weakness and numbness.  She states that initially she was unable to lift her left arm at all.  By the time of her arrival to the emergency department, she was slightly better, unable to lift her arm against gravity fairly well.  She was activated in the field as a code stroke and therefore taken emergently for head CT which was negative, CTA also showed no large vessel occlusion.  Her NIH was one and she had relatively mild symptoms and therefore symptoms were deemed too mild to treat.   LKW: 7 PM tpa given?: no, mild symptoms   ROS: A 14 point ROS was performed and is negative except as noted in the HPI.   Past Medical History:  Diagnosis Date  . Anemia    WARM AUTOIMMUNE HEMOLYTIC ANEMIA  (diagnosed 11/2014; DR. Barbara Cower [facility])  . Anxiety   . Arthritis    currently being seen by rheumatologist for confirmed diagnosing  . Autoimmune hemolytic anemia (HCC)   . CHF (congestive heart failure) (HCC)    diastolic CHF  . Diverticulitis of colon 1994   with colostomy placed and later reversed  . DVT (deep venous thrombosis) (HCC)   . Dysrhythmia    patient states that she has a "premature beat"  . GERD (gastroesophageal reflux disease)   . Headache   . Heart murmur    moderate MR/AR, moderate pulm HTN 04/2015 echo  . 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 Hx: UTI (urinary tract infection)   . Hypertension   . Hyperthyroidism   . Hyponatremia   . Peritonitis (HCC) 1977   hx of  . PONV (postoperative  nausea and vomiting)   . Small bowel obstruction (HCC)      FHx:   Social History:  reports that she quit smoking about 15 years ago. She has never used smokeless tobacco. She reports that she does not drink alcohol and does not use drugs.   Exam: Current vital signs: Wt 57.5 kg   BMI 22.46 kg/m  Vital signs in last 24 hours: Weight:  [57.5 kg] 57.5 kg (09/03 2100)   Physical Exam  Constitutional: Appears well-developed and well-nourished.  Psych: Affect appropriate to situation Eyes: No scleral injection HENT: No OP obstrucion MSK: no joint deformities.  Cardiovascular: Normal rate and regular rhythm.  Respiratory: Effort normal, non-labored breathing GI: Soft.  No distension. There is no tenderness.  Skin: WDI  Neuro: Mental Status: Patient is awake, alert, oriented to person, place, month, year, and situation. Patient is able to give a clear and coherent history. No signs of aphasia or neglect Cranial Nerves: II: Visual Fields are full. Pupils are equal, round, and reactive to light.   III,IV, VI: EOMI without ptosis or diploplia.  V: Facial sensation is symmetric to temperature VII: Facial movement is symmetric.  VIII: hearing is intact to voice X: Uvula elevates symmetrically XI: Shoulder shrug is symmetric. XII: tongue is midline without atrophy or fasciculations.  Motor: Tone is normal. Bulk is normal. 5/5 strength was present in all four extremities.  Sensory: Sensation is diminished in the left face and arm Cerebellar: FNF and HKS are intact bilaterally   I have reviewed labs in epic and the results pertinent to this consultation are: Sodium 119  I have reviewed the images obtained: CT-CTA-negative  Impression: 74 year old female with (self-reported)atrial fibrillation not on anticoagulation who presents with a transient neurological deficit.  Her sodium is rather low, however I doubt that this would cause the focal symptoms that she describes.   Especially in the setting of atrial fibrillation without anticoagulation, I would favor treating this as TIA or mild stroke.  I would start with MRI to see if she has any ischemia and I depend on timing for anticoagulation resumption.  She is very unclear on why it was stopped, but if not truly contraindicated then may need to consider it.  I would also confirm that she does have documented atrial fibrillation, as the EKGs I have access do not show this.  Recommendations: - HgbA1c, fasting lipid panel - MRI  of the brain without contrast - Frequent neuro checks - Echocardiogram - Prophylactic therapy-Antiplatelet med: Aspirin - dose 325mg  PO or 300mg  PR - Risk factor modification - Telemetry monitoring - PT consult, OT consult, Speech consult - Stroke team to follow  , MD [facility] [phone]  If 7pm- 7am, please page neurology on call as listed in [facility].

## 2020-03-07 NOTE — ED Provider Notes (Signed)
[facility] EMERGENCY DEPARTMENT Provider Note   CSN: [id number] Arrival date & time: 03/07/20  2136     History Chief Complaint  Patient presents with  . Weakness    pt called as possible stroke for left arm weakness and slurred speech, lwk at 1900, unable to smile for medic. Hx of TIA's- denies anticoags  . Hypoglycemia    pt bgl was 54 for EMS, given juice- bgl 69 on arrival- c/o weakness    KEYARIA LAWSON is a 74 y.o. female with a history of warm autoimmune hemolytic anemia, chronic hyponatremia/SIADH, DVT with IVC filter, GI bleed, HFpEF, mild mitral and aortic regurgitation, pulmonary hypertension, hypertension s/p TIA, atrial flutter s/p cardioversion, COPD/emphysema, chronic respiratory failure on 2-2.5 L home O2 who presents to the emergency department by EMS from home with a chief complaint of weakness.  The patient reports sudden onset left sided numbness and weakness tonight. LKN 20:00.  She reports that she was sitting in her recliner when she suddenly could not feel her left arm or leg.  Her arm was heavy and she was unable to move or lift it.  States that she tried to stand to get out of her recliner, but was too weak to stand.  Her husband called EMS as he was concerned that she may be having a stroke. She denies dizziness, lightheadedness, slurred speech, facial droop, confusion, or seizure-like activity.    Per EMS, patient stated last known normal was 19:00. She was unable to smile. EMS checked her blood sugar in route and it was 54.  She was given orange juice to drink with improvement to 69.  Patient reports that her symptoms have gradually improved and she no longer has weakness or numbness.  Over the last week, she has had persistent nausea.  Today, she was so nauseated that she only ate 2 slices of bread.  She has had significantly decreased p.o. intake over the last week.  She also has a history of chronic diarrhea and is followed by gastroenterology.  She has had approximately 2-3 episodes of watery diarrhea daily.  No increase in frequency of diarrhea.  No vomiting, melena, hematochezia.  She also endorses dysuria and left flank pain for the last few days.  No hematuria, urinary frequency or hesitancy, right flank pain, abdominal pain, back pain, fever, chills, shortness of breath, cough, or chest pain.  She has a history of hyponatremia and recalls her low sodium at 118.  However, she reports that with previous episodes of hyponatremia that she has never developed neurologic symptoms.  Per chart review, she has a history of chronic hyponatremia/SIAD with a baseline sodium of 1 32-1 36.  Reports that she drinks approximately 1 to 2 glasses of wine daily.  Over the last 2 weeks, she does report a slight increase in her daily alcohol intake to approximately 2 to 3 glasses.  Reports that she does not drink an entire bottle of wine in a single day.  She is a non-smoker.  Denies illicit or recreational substance use.  She is not vaccinated against COVID-19, but has recently been inquiring into a home vaccination program as she would like to be vaccinated.  She reports that her only 3 home medications are Lasix, diltiazem, and metoprolol.  The history is provided by the patient. No language interpreter was used.       Past Medical History:  Diagnosis Date  . Anemia    WARM AUTOIMMUNE HEMOLYTIC ANEMIA  (diagnosed  11/2014; [facility])  . Anxiety   . Arthritis    currently being seen by rheumatologist for confirmed diagnosing  . Autoimmune hemolytic anemia   . CHF (congestive heart failure) (HCC)    diastolic CHF  . Diverticulitis of colon 1994   with colostomy placed and later reversed  . DVT (deep venous thrombosis) (HCC)   . Dysrhythmia    patient states that she has a "premature beat"  . GERD (gastroesophageal reflux disease)   . Headache   . Heart murmur    moderate MR/AR, moderate pulm HTN 04/2015 echo    . 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 Hx: UTI (urinary tract infection)   . Hypertension   . Hyperthyroidism   . Hyponatremia   . Peritonitis (HCC) 1977   hx of  . PONV (postoperative nausea and vomiting)   . Small bowel obstruction [facility])     Patient Active Problem List   Diagnosis Date Noted  . TIA (transient ischemic attack) 03/08/2020  . Hypoglycemia without diagnosis of diabetes mellitus 03/08/2020  . (HFpEF) heart failure with preserved ejection fraction (HCC) 03/08/2020  . Hyponatremia 03/08/2020  . Generalized abdominal pain   . Nausea   . Hypertension   . Autoimmune hemolytic anemia   . DVT (deep venous thrombosis) (HCC)   . 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 Chronic sinusitis 05/15/2015    Past Surgical History:  Procedure Laterality Date  . APPENDECTOMY  1966  . COLON SURGERY     colostomy placed in 1994 and reversed in 1995  . EYE SURGERY Bilateral 2016   cataracts  . LEG SURGERY Bilateral 2011   vein ablation in both legs per patient  . NASAL SINUS SURGERY Bilateral 05/15/2015   Procedure: ENDOSCOPIC SINUS SURGERY;  Surgeon: Melvenia Beam, MD;  Location: [facility] OR;  Service: ENT;  Laterality: Bilateral;  . OVARIAN CYST REMOVAL Left 1966  . pilonidal cyst  1972  . SINUS ENDO W/FUSION Bilateral 12/20/2014   Procedure: BILATERAL ENDOSCOPIC SINUS SURGERY WITH NAVIGATION/BALLOON DILATION;  Surgeon: Melvenia Beam, MD;  Location: [facility] OR;  Service: ENT;  Laterality: Bilateral;  . small bowel obs surgery    . VITRECTOMY Left 2015     OB History   No obstetric history on file.     No family history on file.  Social History   Tobacco Use  . Smoking status: Former Smoker    Quit date: 07/05/2004    Years since quitting: 15.6  . Smokeless tobacco: Never Used  Substance Use Topics  . Alcohol use: Yes    Alcohol/week: 2.0 standard drinks    Types: 2 Glasses of wine per week  . Drug use: No    Home Medications Prior to Admission medications    Medication Sig Start Date End Date Taking? Authorizing Provider  amLODipine (NORVASC) 10 MG tablet Take 10 mg by mouth daily.    [provider]  diltiazem (DILACOR XR) 180 MG 24 hr capsule Take 180 mg by mouth daily.    [provider]  ELIQUIS 5 MG TABS tablet Take 5 mg by mouth 2 (two) times daily.  05/12/17   [provider]  folic acid (FOLVITE) 1 MG tablet Take 1 mg by mouth 2 (two) times daily. 11/22/14   [provider]  furosemide (LASIX) 20 MG tablet  07/11/17   [provider]  HYDROcodone-acetaminophen (NORCO/VICODIN) 5-325 MG tablet Take 1-2 tablets by  mouth every 4 (four) hours as needed for moderate pain or severe pain. Patient not taking: Reported on 08/19/2017 05/16/15   Melvenia Beam, MD  labetalol (NORMODYNE) 100 MG tablet Take 100 mg by mouth 2 (two) times daily.    [provider]  ondansetron (ZOFRAN-ODT) 4 MG disintegrating tablet Take 1 tablet (4 mg total) by mouth every 6 (six) hours as needed for nausea, vomiting or refractory nausea / vomiting. Patient not taking: Reported on 08/19/2017 05/16/15   Melvenia Beam, MD  oxybutynin (DITROPAN-XL) 5 MG 24 hr tablet Take 5 mg by mouth at bedtime.    [provider]  pantoprazole (PROTONIX) 40 MG tablet Take 40 mg by mouth 2 (two) times daily.  11/28/14   [provider]  Potassium Chloride ER 20 MEQ TBCR TK 1 T PO D 06/15/17   [provider]  predniSONE (DELTASONE) 10 MG tablet Take 20 mg by mouth 3 (three) times daily.    [provider]  promethazine (PHENERGAN) 12.5 MG tablet Take 12.5 mg by mouth every 6 (six) hours as needed for nausea or vomiting.    [provider]  zolpidem (AMBIEN) 5 MG tablet Take 5 mg by mouth at bedtime as needed for sleep.  12/12/14   [provider]    Allergies    Lisinopril, Codeine, Erythromycin, Morphine and related, Penicillins, Percocet [oxycodone-acetaminophen], and Percodan  [oxycodone-aspirin]  Review of Systems   Review of Systems  Constitutional: Positive for appetite change. Negative for activity change, chills and fever.  HENT: Negative for congestion.   Eyes: Negative for visual disturbance.  Respiratory: Negative for cough, shortness of breath and wheezing.   Cardiovascular: Negative for chest pain and palpitations.  Gastrointestinal: Positive for abdominal pain, diarrhea (chronic) and nausea. Negative for anal bleeding, blood in stool and vomiting.  Genitourinary: Positive for dysuria and flank pain. Negative for hematuria, menstrual problem, urgency, vaginal discharge and vaginal pain.  Musculoskeletal: Negative for arthralgias, back pain, gait problem, myalgias, neck pain and neck stiffness.  Skin: Negative for rash.  Allergic/Immunologic: Negative for immunocompromised state.  Neurological: Positive for weakness and numbness. Negative for dizziness, tremors, seizures, syncope, speech difficulty, light-headedness and headaches.  Psychiatric/Behavioral: Negative for confusion.    Physical Exam Updated Vital Signs BP 122/65   Pulse 71   Temp 100 F (37.8 C) (Oral)   Resp 16   Ht 5\' 3"  (1.6 m)   Wt 57.5 kg   SpO2 100%   BMI 22.46 kg/m   Physical Exam Vitals and nursing note reviewed.  Constitutional:      General: She is not in acute distress.    Comments: Thin elderly female. No acute distress.  HENT:     Head: Normocephalic.  Eyes:     Conjunctiva/sclera: Conjunctivae normal.  Cardiovascular:     Rate and Rhythm: Normal rate and regular rhythm.     Heart sounds: No murmur heard.  No friction rub. No gallop.   Pulmonary:     Effort: Pulmonary effort is normal. No respiratory distress.  Abdominal:     General: There is no distension.     Palpations: Abdomen is soft. There is no mass.     Tenderness: There is abdominal tenderness. There is no right CVA tenderness, left CVA tenderness, guarding or rebound.     Hernia: No hernia is  present.     Comments: Abdomen is soft and nondistended. Tenderness to palpation in the epigastric and right upper quadrant without rebound or guarding. There  is left CVA tenderness.  Musculoskeletal:     Cervical back: Neck supple.  Skin:    General: Skin is warm.     Findings: No rash.  Neurological:     Mental Status: She is alert.     Comments: Alert and oriented x3. GCS 15. Cranial nerves II through XII are grossly intact. Sensation is intact intact and equal throughout. 5 out of 5 strength against resistance of the bilateral upper and lower extremities. No pronator drift. Finger-to-nose is intact bilaterally. No slurred speech.  Psychiatric:        Behavior: Behavior normal.     ED Results / Procedures / Treatments   Labs (all labs ordered are listed, but only abnormal results are displayed) Labs Reviewed  ETHANOL - Abnormal; Notable for the following components:      Result Value   Alcohol, Ethyl (B) 57 (*)    All other components within normal limits  APTT - Abnormal; Notable for the following components:   aPTT 39 (*)    All other components within normal limits  CBC - Abnormal; Notable for the following components:   WBC 3.0 (*)    RBC 2.46 (*)    Hemoglobin 9.5 (*)    HCT 27.2 (*)    MCV 110.6 (*)    MCH 38.6 (*)    All other components within normal limits  DIFFERENTIAL - Abnormal; Notable for the following components:   Lymphs Abs 0.6 (*)    All other components within normal limits  COMPREHENSIVE METABOLIC PANEL - Abnormal; Notable for the following components:   Sodium 119 (*)    Chloride 79 (*)    Glucose, Bld 62 (*)    BUN 5 (*)    Calcium 8.3 (*)    Total Protein 6.2 (*)    Albumin 3.3 (*)    Total Bilirubin 2.4 (*)    All other components within normal limits  URINALYSIS, ROUTINE W REFLEX MICROSCOPIC - Abnormal; Notable for the following components:   Glucose, UA 50 (*)    Ketones, ur 20 (*)    All other components within normal limits  I-STAT  CHEM 8, ED - Abnormal; Notable for the following components:   Sodium 118 (*)    Chloride 78 (*)    BUN 5 (*)    Glucose, Bld 60 (*)    Calcium, Ion 0.99 (*)    Hemoglobin 9.9 (*)    HCT 29.0 (*)    All other components within normal limits  CBG MONITORING, ED - Abnormal; Notable for the following components:   Glucose-Capillary 61 (*)    All other components within normal limits  CBG MONITORING, ED - Abnormal; Notable for the following components:   Glucose-Capillary 58 (*)    All other components within normal limits  SARS CORONAVIRUS 2 BY RT PCR (HOSPITAL ORDER, PERFORMED IN [facility])  URINE CULTURE  PROTIME-INR  RAPID URINE DRUG SCREEN, HOSP PERFORMED  HEMOGLOBIN A1C  BRAIN NATRIURETIC PEPTIDE  BASIC METABOLIC PANEL  LIPID PANEL  CBG MONITORING, ED  CBG MONITORING, ED    EKG None  Radiology CT Code Stroke CTA Head W/WO contrast  Result Date: 03/07/2020 CLINICAL DATA:  Left-sided weakness.  Facial droop. EXAM: CT ANGIOGRAPHY HEAD AND NECK TECHNIQUE: Multidetector CT imaging of the head and neck was performed using the standard protocol during bolus administration of intravenous contrast. Multiplanar CT image reconstructions and MIPs were obtained to evaluate the vascular anatomy. Carotid stenosis measurements (when  applicable) are obtained utilizing NASCET criteria, using the distal internal carotid diameter as the denominator. CONTRAST:  50mL OMNIPAQUE IOHEXOL 350 MG/ML SOLN COMPARISON:  None. FINDINGS: CTA NECK FINDINGS Aortic arch: Standard 3 vessel aortic arch with mild atherosclerotic plaque. No evidence of a significant arch vessel origin stenosis although note that the brachiocephalic artery origin was incompletely imaged. Right carotid system: Patent with mild calcified plaque at the carotid bifurcation. No evidence of significant stenosis or dissection. Left carotid system: Patent with moderate calcified plaque about the carotid bifurcation. No evidence of  significant stenosis or dissection. Vertebral arteries: Patent and codominant. Calcified plaque at the right vertebral artery origin without evidence of significant stenosis. Skeleton: Focally advanced disc degeneration at C6-7. Other neck: Mild asymmetric enlargement and increased density of the left parotid gland without a discrete mass or surrounding inflammatory change. No evidence of cervical lymphadenopathy. Upper chest: Biapical emphysema, asymmetrically advanced on the right. Review of the MIP images confirms the above findings CTA HEAD FINDINGS Anterior circulation: The internal carotid arteries are patent from skull base to carotid termini with mild atherosclerotic plaque bilaterally not resulting in significant stenosis. ACAs and MCAs are patent with mild branch vessel irregularity but no evidence of a proximal branch occlusion or significant proximal stenosis. A small fenestration is questioned in the left M1 segment, an anatomic variant. No aneurysm is identified. Posterior circulation: The intracranial vertebral arteries are widely patent to the basilar. Patent PICA and SCA origins are seen bilaterally. The basilar artery is widely patent. Posterior communicating arteries are not identified and may be diminutive or absent. Both PCAs are patent without evidence of a significant proximal stenosis. No aneurysm is identified. Venous sinuses: Patent with the left transverse and sigmoid sinuses being hypoplastic. Anatomic variants: None of significance. Review of the MIP images confirms the above findings IMPRESSION: 1. No large vessel occlusion. 2. Mild atherosclerosis in the head and neck without a significant stenosis. 3. Aortic Atherosclerosis (ICD10-I70.0) and Emphysema (ICD10-J43.9). These results were communicated to Dr. Amada JupiterKirkpatrick at 10:25 pm on 03/07/2020 by text page via the [facility] messaging system. Electronically Signed   By: Sebastian AcheAllen  Grady M.D.   On: 03/07/2020 22:36   CT Code Stroke CTA Neck W/WO  contrast  Result Date: 03/07/2020 CLINICAL DATA:  Left-sided weakness.  Facial droop. EXAM: CT ANGIOGRAPHY HEAD AND NECK TECHNIQUE: Multidetector CT imaging of the head and neck was performed using the standard protocol during bolus administration of intravenous contrast. Multiplanar CT image reconstructions and MIPs were obtained to evaluate the vascular anatomy. Carotid stenosis measurements (when applicable) are obtained utilizing NASCET criteria, using the distal internal carotid diameter as the denominator. CONTRAST:  50mL OMNIPAQUE IOHEXOL 350 MG/ML SOLN COMPARISON:  None. FINDINGS: CTA NECK FINDINGS Aortic arch: Standard 3 vessel aortic arch with mild atherosclerotic plaque. No evidence of a significant arch vessel origin stenosis although note that the brachiocephalic artery origin was incompletely imaged. Right carotid system: Patent with mild calcified plaque at the carotid bifurcation. No evidence of significant stenosis or dissection. Left carotid system: Patent with moderate calcified plaque about the carotid bifurcation. No evidence of significant stenosis or dissection. Vertebral arteries: Patent and codominant. Calcified plaque at the right vertebral artery origin without evidence of significant stenosis. Skeleton: Focally advanced disc degeneration at C6-7. Other neck: Mild asymmetric enlargement and increased density of the left parotid gland without a discrete mass or surrounding inflammatory change. No evidence of cervical lymphadenopathy. Upper chest: Biapical emphysema, asymmetrically advanced on the right. Review of the MIP  images confirms the above findings CTA HEAD FINDINGS Anterior circulation: The internal carotid arteries are patent from skull base to carotid termini with mild atherosclerotic plaque bilaterally not resulting in significant stenosis. ACAs and MCAs are patent with mild branch vessel irregularity but no evidence of a proximal branch occlusion or significant proximal  stenosis. A small fenestration is questioned in the left M1 segment, an anatomic variant. No aneurysm is identified. Posterior circulation: The intracranial vertebral arteries are widely patent to the basilar. Patent PICA and SCA origins are seen bilaterally. The basilar artery is widely patent. Posterior communicating arteries are not identified and may be diminutive or absent. Both PCAs are patent without evidence of a significant proximal stenosis. No aneurysm is identified. Venous sinuses: Patent with the left transverse and sigmoid sinuses being hypoplastic. Anatomic variants: None of significance. Review of the MIP images confirms the above findings IMPRESSION: 1. No large vessel occlusion. 2. Mild atherosclerosis in the head and neck without a significant stenosis. 3. Aortic Atherosclerosis (ICD10-I70.0) and Emphysema (ICD10-J43.9). These results were communicated to Dr. Amada Jupiter at 10:25 pm on 03/07/2020 by text page via the [facility] messaging system. Electronically Signed   By: Sebastian Ache M.D.   On: 03/07/2020 22:36   MR BRAIN WO CONTRAST  Result Date: 03/08/2020 CLINICAL DATA:  Transient ischemic attack EXAM: MRI HEAD WITHOUT CONTRAST TECHNIQUE: Multiplanar, multiecho pulse sequences of the brain and surrounding structures were obtained without intravenous contrast. COMPARISON:  None. FINDINGS: Brain: No acute infarct, acute hemorrhage or extra-axial collection. Multifocal hyperintense T2-weighted signal within the white matter. There is generalized atrophy without lobar predilection. No chronic microhemorrhage. Normal midline structures. Vascular: Normal flow voids. Skull and upper cervical spine: Normal marrow signal. Sinuses/Orbits: Negative. Postsurgical changes of the maxillary sinuses and nasal septum. Other: None. IMPRESSION: 1. No acute intracranial abnormality. 2. Generalized atrophy and findings of chronic microvascular disease. Electronically Signed   By: Deatra Robinson M.D.   On: 03/08/2020  03:06   CT HEAD CODE STROKE WO CONTRAST  Result Date: 03/07/2020 CLINICAL DATA:  Code stroke.  Left-sided weakness.  Facial droop. EXAM: CT HEAD WITHOUT CONTRAST TECHNIQUE: Contiguous axial images were obtained from the base of the skull through the vertex without intravenous contrast. COMPARISON:  Head CT 01/11/2018 and MRI 08/20/2014 FINDINGS: Brain: There is no evidence of an acute infarct, intracranial hemorrhage, mass, midline shift, or extra-axial fluid collection. Mild cerebral atrophy is within normal limits for age. Hypodensities in the cerebral white matter bilaterally are nonspecific but compatible with mild chronic small vessel ischemic disease, stable to slightly progressed. 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 Vascular: Calcified atherosclerosis at the skull base. No hyperdense vessel. Skull: No fracture or suspicious osseous lesion. Sinuses/Orbits: Prior endoscopic sinus surgery. Extensive chronic sinusitis with circumferential mucosal thickening in the maxillary sinuses, near complete opacification of the sphenoid sinuses, complete opacification of the right frontal sinus, and moderate bilateral ethmoid sinus mucosal thickening. 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 Small chronic effusion at the left mastoid tip. Bilateral cataract extraction. Other: None. ASPECTS [facility] Stroke Program Early CT Score) - Ganglionic level infarction (caudate, lentiform nuclei, internal capsule, insula, M1-M3 cortex): 7 - Supraganglionic infarction (M4-M6 cortex): 3 Total score (0-10 with 10 being normal): 10 IMPRESSION: 1. No evidence of acute intracranial abnormality. 2. ASPECTS is 10. 3. Mild chronic small vessel ischemic disease. These results were communicated to Dr. Amada Jupiter at 10:05 pm on 03/07/2020 by text page via the [facility] messaging system.  Electronically Signed   By: Sebastian Ache M.D.   On: 03/07/2020 22:06    Procedures .Critical Care Performed  by: Barkley Boards, PA-C Authorized by: Barkley Boards, PA-C   Critical care provider statement:    Critical care time (minutes):  45   Critical care time was exclusive of:  Separately billable procedures and treating other patients and teaching time   Critical care was necessary to treat or prevent imminent or life-threatening deterioration of the following conditions:  Metabolic crisis   Critical care was time spent personally by me on the following activities:  Ordering and performing treatments and interventions, ordering and review of laboratory studies, ordering and review of radiographic studies, pulse oximetry, re-evaluation of patient's condition, review of old charts, obtaining history from patient or surrogate, examination of patient, evaluation of patient's response to treatment, discussions with consultants and development of treatment plan with patient or surrogate   I assumed direction of critical care for this patient from another provider in my specialty: no     (including critical care time)  Medications Ordered in ED Medications  sodium chloride flush (NS) 0.9 % injection 3 mL (3 mLs Intravenous Given 03/08/20 0934)  sodium chloride flush (NS) 0.9 % injection 3 mL (has no administration in time range)  0.9 %  sodium chloride infusion (has no administration in time range)  dextrose 5 %-0.9 % sodium chloride infusion (0 mLs Intravenous Stopped 03/08/20 0934)  diltiazem (CARDIZEM CD) 24 hr capsule 180 mg (180 mg Oral Given 03/08/20 0932)  furosemide (LASIX) tablet 20 mg (20 mg Oral Given 03/08/20 0931)  zolpidem (AMBIEN) tablet 5 mg (has no administration in time range)  ondansetron (ZOFRAN-ODT) disintegrating tablet 4 mg (has no administration in time range)  pantoprazole (PROTONIX) EC tablet 40 mg (40 mg Oral Given 03/08/20 0932)  oxybutynin (DITROPAN-XL) 24 hr tablet 5 mg (5 mg Oral Given 03/08/20 0156)  folic acid (FOLVITE) tablet 1 mg (1 mg Oral Given 03/08/20 0932)  potassium  chloride SA (KLOR-CON) CR tablet 20 mEq (20 mEq Oral Given 03/08/20 0932)  acetaminophen (TYLENOL) tablet 650 mg (650 mg Oral Given 03/08/20 0936)    Or  acetaminophen (TYLENOL) suppository 650 mg ( Rectal See Alternative 03/08/20 0936)  clopidogrel (PLAVIX) tablet 75 mg (75 mg Oral Given 03/08/20 0932)  dextrose 50 % solution 50 mL (50 mLs Intravenous Given 03/07/20 2247)  iohexol (OMNIPAQUE) 350 MG/ML injection 50 mL (50 mLs Intravenous Contrast Given 03/07/20 2217)  ondansetron (ZOFRAN) injection 4 mg (4 mg Intravenous Given 03/08/20 0041)  LORazepam (ATIVAN) injection 1 mg (1 mg Intravenous Given 03/08/20 0225)  sodium chloride 0.9 % bolus 500 mL (0 mLs Intravenous Stopped 03/08/20 4782)    ED Course  I have reviewed the triage vital signs and the nursing notes.  Pertinent labs & imaging results that were available during my care of the patient were reviewed by me and considered in my medical decision making (see chart for details).  Clinical Course as of Mar 09 1007  Fri Mar 07, 2020  2217 Glucose(!): 60 [MM]  2217 Alcohol, Ethyl (B)(!): 57 [MM]  2217 Sodium(!!): 118 [MM]    Clinical Course User Index [MM] Lowana Hable, Coral Else, PA-C   MDM Rules/Calculators/A&P                          74 year old female with a history of warm autoimmune hemolytic anemia, chronic hyponatremia/SIADH, DVT with IVC filter, GI bleed, HFpEF,  mild mitral and aortic regurgitation, pulmonary hypertension, hypertension s/p TIA, atrial flutter s/p cardioversion, COPD/emphysema, chronic respiratory failure on 2-2.5 L home O2 who presents to the emergency department from home with a chief complaint of left-sided weakness and numbness that began at approximately 20:00.  The patient was seen and independently evaluated by Dr. Pilar Plate, attending physician.   Patient was initially brought in as a code stroke.  CT head and CTA were negative.  The patient has been seen by Dr. Amada Jupiter, neurology who recommends MR brain for TIA work-up or  mild stroke.  Please see his consult note for further recommendations.  Labs were notable for hyponatremia of 118.  Patient reports a history of chronic hyponatremia.  She has had poor food intake for the last few days.  She has been trying to hydrate with Pedialyte.  She also has chronic loose stools, but does also drink 1 to 2 glasses of wine daily, but reports that this is increased to 2 to 3 glasses of wine over the last 2 weeks.  Notably, patient was also hypoglycemic.  54 on EMS arrival and in the 60s when she arrived in the ER.  This is likely also secondary to poor p.o. intake.  She was given an amp of D50, but continued to have hypoglycemia.  She was started on D5 normal saline at 50 cc an hour.  MRI brain is pending.  Patient did note dysuria and flank pain.  UA is pending.  CONSULT: Spoke with Dr. Debby Bud, hospitalist team, who will accept the patient for admission. The patient appears reasonably stabilized for admission considering the current resources, flow, and capabilities available in the ED at this time, and I doubt any other [facility] requiring further screening and/or treatment in the ED prior to admission.   Final Clinical Impression(s) / ED Diagnoses Final diagnoses:  TIA (transient ischemic attack)  Hyponatremia  Hypoglycemia    Rx / DC Orders ED Discharge Orders    None       Barkley Boards, PA-C 03/08/20 1008    Sabas Sous, MD 03/08/20 1458

## 2020-03-08 ENCOUNTER — Encounter (HOSPITAL_COMMUNITY): Payer: Self-pay | Admitting: Internal Medicine

## 2020-03-08 ENCOUNTER — Inpatient Hospital Stay (HOSPITAL_COMMUNITY): Payer: Medicare Other

## 2020-03-08 ENCOUNTER — Observation Stay (HOSPITAL_COMMUNITY): Payer: Medicare Other

## 2020-03-08 DIAGNOSIS — Z8673 Personal history of transient ischemic attack (TIA), and cerebral infarction without residual deficits: Secondary | ICD-10-CM

## 2020-03-08 DIAGNOSIS — I361 Nonrheumatic tricuspid (valve) insufficiency: Secondary | ICD-10-CM

## 2020-03-08 DIAGNOSIS — I351 Nonrheumatic aortic (valve) insufficiency: Secondary | ICD-10-CM

## 2020-03-08 DIAGNOSIS — R569 Unspecified convulsions: Secondary | ICD-10-CM | POA: Diagnosis not present

## 2020-03-08 DIAGNOSIS — Z86718 Personal history of other venous thrombosis and embolism: Secondary | ICD-10-CM | POA: Diagnosis not present

## 2020-03-08 DIAGNOSIS — E162 Hypoglycemia, unspecified: Secondary | ICD-10-CM | POA: Diagnosis present

## 2020-03-08 DIAGNOSIS — Z8719 Personal history of other diseases of the digestive system: Secondary | ICD-10-CM | POA: Diagnosis not present

## 2020-03-08 DIAGNOSIS — D591 Autoimmune hemolytic anemia, unspecified: Secondary | ICD-10-CM | POA: Diagnosis not present

## 2020-03-08 DIAGNOSIS — I5030 Unspecified diastolic (congestive) heart failure: Secondary | ICD-10-CM | POA: Diagnosis present

## 2020-03-08 DIAGNOSIS — G459 Transient cerebral ischemic attack, unspecified: Secondary | ICD-10-CM | POA: Diagnosis present

## 2020-03-08 DIAGNOSIS — E871 Hypo-osmolality and hyponatremia: Secondary | ICD-10-CM | POA: Diagnosis present

## 2020-03-08 DIAGNOSIS — I5032 Chronic diastolic (congestive) heart failure: Secondary | ICD-10-CM | POA: Diagnosis not present

## 2020-03-08 LAB — RAPID URINE DRUG SCREEN, HOSP PERFORMED
Amphetamines: NOT DETECTED
Barbiturates: NOT DETECTED
Benzodiazepines: NOT DETECTED
Cocaine: NOT DETECTED
Opiates: NOT DETECTED
Tetrahydrocannabinol: NOT DETECTED

## 2020-03-08 LAB — ECHOCARDIOGRAM COMPLETE
AR max vel: 1.7 cm2
AV Area VTI: 2 cm2
AV Area mean vel: 1.67 cm2
AV Mean grad: 8 mmHg
AV Peak grad: 17.8 mmHg
Ao pk vel: 2.11 m/s
Area-P 1/2: 2.62 cm2
Height: 63 in
P 1/2 time: 535 msec
S' Lateral: 2.4 cm
Weight: 2028.23 oz

## 2020-03-08 LAB — BASIC METABOLIC PANEL
Anion gap: 9 (ref 5–15)
Anion gap: 5 (ref 5–15)
BUN: 7 mg/dL — ABNORMAL LOW (ref 8–23)
BUN: 6 mg/dL — ABNORMAL LOW (ref 8–23)
CO2: 28 mmol/L (ref 22–32)
CO2: 30 mmol/L (ref 22–32)
Calcium: 8 mg/dL — ABNORMAL LOW (ref 8.9–10.3)
Calcium: 7.8 mg/dL — ABNORMAL LOW (ref 8.9–10.3)
Chloride: 86 mmol/L — ABNORMAL LOW (ref 98–111)
Chloride: 90 mmol/L — ABNORMAL LOW (ref 98–111)
Creatinine, Ser: 0.63 mg/dL (ref 0.44–1.00)
Creatinine, Ser: 0.57 mg/dL (ref 0.44–1.00)
GFR calc Af Amer: >60 mL/min (ref >60)
GFR calc Af Amer: >60 mL/min (ref >60)
GFR calc non Af Amer: >60 mL/min (ref >60)
GFR calc non Af Amer: >60 mL/min (ref >60)
Glucose, Bld: 101 mg/dL — ABNORMAL HIGH (ref 70–99)
Glucose, Bld: 147 mg/dL — ABNORMAL HIGH (ref 70–99)
Potassium: 4.8 mmol/L (ref 3.5–5.1)
Potassium: 3.9 mmol/L (ref 3.5–5.1)
Sodium: 123 mmol/L — ABNORMAL LOW (ref 135–145)
Sodium: 125 mmol/L — ABNORMAL LOW (ref 135–145)

## 2020-03-08 LAB — URINALYSIS, ROUTINE W REFLEX MICROSCOPIC
Bilirubin Urine: NEGATIVE
Glucose, UA: 50 mg/dL — AB
Hgb urine dipstick: NEGATIVE
Ketones, ur: 20 mg/dL — AB
Leukocytes,Ua: NEGATIVE
Nitrite: NEGATIVE
Protein, ur: NEGATIVE mg/dL
Specific Gravity, Urine: 1.014 (ref 1.005–1.030)
pH: 8 (ref 5.0–8.0)

## 2020-03-08 LAB — CBG MONITORING, ED
Glucose-Capillary: 124 mg/dL — ABNORMAL HIGH (ref 70–99)
Glucose-Capillary: 58 mg/dL — ABNORMAL LOW (ref 70–99)
Glucose-Capillary: 76 mg/dL (ref 70–99)
Glucose-Capillary: 88 mg/dL (ref 70–99)

## 2020-03-08 LAB — OSMOLALITY, URINE: Osmolality, Ur: 172 mOsm/kg — ABNORMAL LOW (ref 300–900)

## 2020-03-08 LAB — SARS CORONAVIRUS 2 BY RT PCR (HOSPITAL ORDER, PERFORMED IN ~~LOC~~ HOSPITAL LAB): SARS Coronavirus 2: NEGATIVE

## 2020-03-08 LAB — HEMOGLOBIN A1C: Hgb A1c MFr Bld: <3.5 % — ABNORMAL LOW (ref 4.8–5.6)

## 2020-03-08 LAB — TSH: TSH: 3.694 u[IU]/mL (ref 0.350–4.500)

## 2020-03-08 LAB — BRAIN NATRIURETIC PEPTIDE: B Natriuretic Peptide: 166 pg/mL — ABNORMAL HIGH (ref 0.0–100.0)

## 2020-03-08 LAB — LIPID PANEL
Cholesterol: 155 mg/dL (ref 0–200)
HDL: 91 mg/dL (ref >40)
LDL Cholesterol: 49 mg/dL (ref 0–99)
Total CHOL/HDL Ratio: 1.7 RATIO
Triglycerides: 74 mg/dL (ref <150)
VLDL: 15 mg/dL (ref 0–40)

## 2020-03-08 LAB — SODIUM, URINE, RANDOM: Sodium, Ur: 35 mmol/L

## 2020-03-08 LAB — OSMOLALITY: Osmolality: 256 mOsm/kg — ABNORMAL LOW (ref 275–295)

## 2020-03-08 LAB — CORTISOL: Cortisol, Plasma: 10 ug/dL

## 2020-03-08 MED ORDER — ONDANSETRON 4 MG PO TBDP
4.0000 mg | ORAL_TABLET | Freq: Four times a day (QID) | ORAL | Status: DC | PRN
  Administered 2020-03-08 – 2020-03-19 (×14): 4 mg via ORAL
  Filled 2020-03-08 (×16): qty 1

## 2020-03-08 MED ORDER — APIXABAN 5 MG PO TABS
5.0000 mg | ORAL_TABLET | Freq: Two times a day (BID) | ORAL | Status: DC
  Filled 2020-03-08: qty 1

## 2020-03-08 MED ORDER — SODIUM CHLORIDE 0.9 % IV BOLUS
500.0000 mL | Freq: Once | INTRAVENOUS | Status: AC
  Administered 2020-03-08: 500 mL via INTRAVENOUS

## 2020-03-08 MED ORDER — DEXTROSE-NACL 5-0.9 % IV SOLN: INTRAVENOUS | Status: DC

## 2020-03-08 MED ORDER — SODIUM CHLORIDE 0.9 % IV SOLN: Freq: Once | INTRAVENOUS | Status: DC

## 2020-03-08 MED ORDER — FUROSEMIDE 20 MG PO TABS
20.0000 mg | ORAL_TABLET | Freq: Two times a day (BID) | ORAL | Status: DC
  Administered 2020-03-08: 20 mg via ORAL
  Filled 2020-03-08 (×2): qty 1

## 2020-03-08 MED ORDER — COSYNTROPIN 0.25 MG IJ SOLR
0.2500 mg | Freq: Once | INTRAMUSCULAR | Status: AC
  Administered 2020-03-09: 0.25 mg via INTRAVENOUS
  Filled 2020-03-08 (×2): qty 0.25

## 2020-03-08 MED ORDER — AMLODIPINE BESYLATE 5 MG PO TABS: 10.0000 mg | ORAL_TABLET | Freq: Every day | ORAL | Status: DC

## 2020-03-08 MED ORDER — POTASSIUM CHLORIDE CRYS ER 20 MEQ PO TBCR
20.0000 meq | EXTENDED_RELEASE_TABLET | Freq: Every day | ORAL | Status: DC
  Administered 2020-03-08: 20 meq via ORAL
  Filled 2020-03-08: qty 1

## 2020-03-08 MED ORDER — LORAZEPAM 2 MG/ML IJ SOLN
1.0000 mg | Freq: Once | INTRAMUSCULAR | Status: AC | PRN
  Administered 2020-03-08: 1 mg via INTRAVENOUS
  Filled 2020-03-08: qty 1

## 2020-03-08 MED ORDER — VANCOMYCIN HCL IN DEXTROSE 1-5 GM/200ML-% IV SOLN
1000.0000 mg | INTRAVENOUS | Status: DC
  Administered 2020-03-08 – 2020-03-10 (×3): 1000 mg via INTRAVENOUS
  Filled 2020-03-08 (×4): qty 200

## 2020-03-08 MED ORDER — ACETAMINOPHEN 325 MG PO TABS
650.0000 mg | ORAL_TABLET | Freq: Four times a day (QID) | ORAL | Status: DC | PRN
  Administered 2020-03-08 – 2020-03-20 (×20): 650 mg via ORAL
  Filled 2020-03-08 (×22): qty 2

## 2020-03-08 MED ORDER — ZOLPIDEM TARTRATE 5 MG PO TABS
5.0000 mg | ORAL_TABLET | Freq: Every evening | ORAL | Status: DC | PRN
  Administered 2020-03-08 – 2020-03-19 (×9): 5 mg via ORAL
  Filled 2020-03-08 (×9): qty 1

## 2020-03-08 MED ORDER — DILTIAZEM HCL ER COATED BEADS 180 MG PO CP24
180.0000 mg | ORAL_CAPSULE | Freq: Every day | ORAL | Status: DC
  Administered 2020-03-08 – 2020-03-20 (×13): 180 mg via ORAL
  Filled 2020-03-08 (×14): qty 1

## 2020-03-08 MED ORDER — PANTOPRAZOLE SODIUM 40 MG PO TBEC
40.0000 mg | DELAYED_RELEASE_TABLET | Freq: Two times a day (BID) | ORAL | Status: DC
  Administered 2020-03-08 – 2020-03-20 (×26): 40 mg via ORAL
  Filled 2020-03-08 (×26): qty 1

## 2020-03-08 MED ORDER — ACETAMINOPHEN 650 MG RE SUPP: 650.0000 mg | Freq: Four times a day (QID) | RECTAL | Status: DC | PRN

## 2020-03-08 MED ORDER — LABETALOL HCL 200 MG PO TABS
100.0000 mg | ORAL_TABLET | Freq: Two times a day (BID) | ORAL | Status: DC
  Administered 2020-03-08: 100 mg via ORAL
  Filled 2020-03-08: qty 1

## 2020-03-08 MED ORDER — OXYBUTYNIN CHLORIDE ER 5 MG PO TB24
5.0000 mg | ORAL_TABLET | Freq: Every day | ORAL | Status: DC
  Administered 2020-03-08 – 2020-03-17 (×11): 5 mg via ORAL
  Filled 2020-03-08 (×12): qty 1

## 2020-03-08 MED ORDER — PREDNISONE 20 MG PO TABS
20.0000 mg | ORAL_TABLET | Freq: Three times a day (TID) | ORAL | Status: DC
  Filled 2020-03-08: qty 1

## 2020-03-08 MED ORDER — FOLIC ACID 1 MG PO TABS
1.0000 mg | ORAL_TABLET | Freq: Two times a day (BID) | ORAL | Status: DC
  Administered 2020-03-08 – 2020-03-20 (×26): 1 mg via ORAL
  Filled 2020-03-08 (×26): qty 1

## 2020-03-08 MED ORDER — CLOPIDOGREL BISULFATE 75 MG PO TABS
75.0000 mg | ORAL_TABLET | Freq: Every day | ORAL | Status: DC
  Administered 2020-03-08 – 2020-03-20 (×13): 75 mg via ORAL
  Filled 2020-03-08 (×13): qty 1

## 2020-03-08 NOTE — Progress Notes (Signed)
EEG complete - results pending 

## 2020-03-08 NOTE — Progress Notes (Addendum)
Brief note: -Admitted earlier today.  -Kindly refer to the H&P done earlier today: "Beverly Lawrence is a 74 y.o. female with medical history significant of PAF, HTN, chronic loose stools at 2-3 per day, SIADH, hemolytic anemia, h/o GI bleed 2 years ago. She reports that a few years ago she had an episode of bilateral UE weakness when her Na was low. Today she had an episode of left UE weakness and numbness. EMS was called and by report she had facial droop and weakness. Although she gets her care through [facility] she was brought to [facility], as a stroke center, for evaluation of possible acute stroke.   ED Course: Tmax 100, 175.93  HR 92, RR 16. On presentation ED-PA reports a non-focal neurologic exam. Lab revealed Na 118, baseline of 132, glucose of 60, Hgb 9.9 with WBC 3.0. Patient was given amp of D50 for hypoglycemia. She is NOT diabetic. CTA head/neck with no vascular occlusion, no large vessel occlusion. Patient was seen by Dr. Amada Jupiter who felt there was not evidence of CVA with a non-focal exam but was concerned for TIA and recommended MRI brain to complete evaluation. [facility] called to admit patient for continue treatment of hyponatremia and hypoglycemia".  -Hyponatremia is noted.  Will discontinue Lasix.  Will check urine sodium, though, this will likely not be accurate as patient was on diuretics.  Will prefer to check urine bicarbonate.  Hyponatremia is noted with some uricosuria.  We will also check urine osmolality and serum osmolality.  Will increase D5 normal saline to 100 cc an hour.  Will monitor BMP every 8 hours.  Will check orthostasis every 8 hours.  Blood pressure to work-up for possible Fanconi syndrome.  -MRI of the brain is negative.  Further management depend on hospital course.

## 2020-03-08 NOTE — Progress Notes (Signed)
STROKE TEAM PROGRESS NOTE   INTERVAL HISTORY No family is at the bedside.  Patient recounted HPI with me.  She had episode of left side arm and leg weakness and numbness, now resolved.  MRI no stroke.  She does have history of A. fib status post cardioversion, was on Eliquis but later discontinued.  Also had a history of DVT status post IVC filter, GI bleeding in 2019, long history of autoimmune hemolytic anemia with frequent blood transfusion.  He has been following with GI and hematology over the years.  Her sodium 118 was low and also had hypoglycemia episode in ER.  Currently passed a swallow and off D5 normal saline.   OBJECTIVE Vitals:   03/08/20 0515 03/08/20 0530 03/08/20 0545 03/08/20 0600  BP: 102/62 100/65 (!) 106/57 (!) 103/58  Pulse: 63 64 66 63  Resp:      Temp:      TempSrc:      SpO2: 100% 100% 100% 100%  Weight:      Height:        CBC:  Recent Labs  Lab 03/07/20 2144 03/07/20 2154  WBC 3.0*  --   NEUTROABS 2.1  --   HGB 9.5* 9.9*  HCT 27.2* 29.0*  MCV 110.6*  --   PLT 163  --     Basic Metabolic Panel:  Recent Labs  Lab 03/07/20 2144 03/07/20 2154  NA 119* 118*  K 4.5 4.6  CL 79* 78*  CO2 26  --   GLUCOSE 62* 60*  BUN 5* 5*  CREATININE 0.58 0.50  CALCIUM 8.3*  --     Lipid Panel: No results found for: CHOL, TRIG, HDL, CHOLHDL, VLDL, LDLCALC HgbA1c: No results found for: HGBA1C Urine Drug Screen:     Component Value Date/Time   LABOPIA NONE DETECTED 03/08/2020 0056   COCAINSCRNUR NONE DETECTED 03/08/2020 0056   LABBENZ NONE DETECTED 03/08/2020 0056   AMPHETMU NONE DETECTED 03/08/2020 0056   THCU NONE DETECTED 03/08/2020 0056   LABBARB NONE DETECTED 03/08/2020 0056    Alcohol Level     Component Value Date/Time   ETH 57 (H) 03/07/2020 2144    IMAGING  CT Code Stroke CTA Head W/WO contrast CT Code Stroke CTA Neck W/WO contrast 03/07/2020 IMPRESSION:  1. No large vessel occlusion.  2. Mild atherosclerosis in the head and neck without  a significant stenosis.  3. Aortic Atherosclerosis (ICD10-I70.0) and Emphysema (ICD10-J43.9).   MR BRAIN WO CONTRAST 03/08/2020 IMPRESSION:  1. No acute intracranial abnormality.  2. Generalized atrophy and findings of chronic microvascular disease.   CT HEAD CODE STROKE WO CONTRAST 03/07/2020 IMPRESSION:  1. No evidence of acute intracranial abnormality.  2. ASPECTS is 10.  3. Mild chronic small vessel ischemic disease.    PHYSICAL EXAM  Temp:  [98.5 F (36.9 C)-100 F (37.8 C)] 98.5 F (36.9 C) (09/04 1633) Pulse Rate:  [43-92] 65 (09/04 1630) Resp:  [11-24] 16 (09/04 1015) BP: (80-175)/(42-93) 91/58 (09/04 1630) SpO2:  [59 %-100 %] 100 % (09/04 1630) Weight:  [57.5 kg] 57.5 kg (09/03 2100)  General - Well nourished, well developed, in no apparent distress.  Ophthalmologic - fundi not visualized due to noncooperation.  Cardiovascular - Regular rhythm and rate.  Mental Status -  Level of arousal and orientation to time, place, and person were intact. Language including expression, naming, repetition, comprehension was assessed and found intact. Fund of Knowledge was assessed and was intact.  Cranial Nerves II - XII - II -  Visual field intact OU. III, IV, VI - Extraocular movements intact. V - Facial sensation intact bilaterally. VII - Facial movement intact bilaterally. VIII - Hearing & vestibular intact bilaterally. X - Palate elevates symmetrically. XI - Chin turning & shoulder shrug intact bilaterally. XII - Tongue protrusion intact.  Motor Strength - The patient's strength was normal in all extremities and pronator drift was absent except right knee flexion 4 -/5 due to arthritis.  Bulk was normal and fasciculations were absent.   Motor Tone - Muscle tone was assessed at the neck and appendages and was normal.  Reflexes - The patient's reflexes were symmetrical in all extremities and she had no pathological reflexes.  Sensory - Light touch, temperature/pinprick  were assessed and were symmetrical.    Coordination - The patient had normal movements in the hands with no ataxia or dysmetria.  Tremor was absent.  Gait and Station - deferred.    ASSESSMENT/PLAN Beverly Lawrence is a 74 y.o. female with history of DVT s/p IVC filter, HTN, chronic hyponatremia, CHF, autoimmune hemolytic anemia, TIA's, atrial fibrillation status post cardioversion (not on anticoagulation due to severe anemia), upper GI bleeding 2019 who presents with left-sided weakness and numbness. She did not receive IV t-PA due to mild deficits.  TIA - likely due to A. fib not on anticoagulation. However, DDx including hypoglycemia and seizure due to hyponatremia  Code Stroke CT Head - No evidence of acute intracranial abnormality. ASPECTS is 10. Mild chronic small vessel ischemic disease.   MRI head - No acute intracranial abnormality. Generalized atrophy and findings of chronic microvascular disease.   CTA H&N - No large vessel occlusion. Mild atherosclerosis in the head and neck without a significant stenosis.   2D Echo EF 60 to 65%  [employer] Virus 2 - negative  LDL 49  HgbA1c <3.5  UDS - negative  VTE prophylaxis - SCDs  No antithrombotic prior to admission, now on Plavix. Continue on discharge. Recommend to discuss with pt GI and hematology providers to see whether pt can be anticoagulation candidate this time.  Ongoing aggressive stroke risk factor management  Therapy recommendations:  pending  Disposition:  Pending  Hx of afib/aflutter  Status post cardioversion 02/2016  Was on Eliquis, however discontinued due to severe anemia  Follows with cardiology Dr. Desma Maxim in [facility]  On Cardizem  Not on [facility] at home  Currently on Plavix.  Recommend to discuss with GI and hematology provider to see if patient can be a candidate for anticoagulation at this time.  Hx of autoimmune hemolytic anemia  Longstanding history of severe anemia  Was on prednisone,  discontinued due to GI bleeding  Had most recent blood transfusion in 08/2019 due to severe anemia  This admission hemoglobin 9.9  Currently on Plavix, need to discuss with hematology to see if the patient can take anticoagulation this time.  Hx of UGIB  05/2018 upper GI bleeding status post EGD showed a large duodenal bleeding ulcer due to overdose of ibuprofen and prednisone use  Cauterized and treated with PPI  No more GI bleeding since  Currently on Plavix, need to discuss with GI to see if the patient can be anticoagulation candidate given current TIA  History of bilateral DVT  status post IVC filter in 2018  ?  PE at that time  Not on [facility] due to severe anemia  Hypotension Hx of hypertension  Home BP meds: amlodipine ; diltiazem ; labetalol, Lasix, metoprolol  Current BP meds:  diltiazem  BP on the low side - 90's - 110's . Long-term BP goal normotensive  Chronic hyponatremia  ? SIADH  Na 118 -> 123  EEG pending to rule out seizure  Treatment per primary team  Hypoglycemia  Glucose 62->88->76  Hemoglobin A1c < 3.5  Off D5 normal saline  On diet  Close monitoring  Other Stroke Risk Factors  Advanced age  Former cigarette smoker - quit  ETOH use, advised to drink no more than 1 alcoholic beverage per day.  Hx of TIAs  Diastolic chronic heart Failure  PVD  CAD  Other Active Problems  Code status - Full code  Leukopenia - 3.0  Aortic Atherosclerosis (ICD10-I70.0)    Hospital day # 0  I spent  35 minutes in total face-to-face time with the patient, more than 50% of which was spent in counseling and coordination of care, reviewing test results, images and medication, and discussing the diagnosis, treatment plan and potential prognosis. This patient's care requiresreview of multiple databases, neurological assessment, discussion with family, other specialists and medical decision making of high complexity.  Marvel Plan, MD PhD Stroke  Neurology 03/08/2020 5:08 PM    To contact Stroke Continuity provider, please refer to [email or web address]. After hours, contact General Neurology

## 2020-03-08 NOTE — ED Notes (Signed)
Doctor at the bedside 

## 2020-03-08 NOTE — ED Notes (Signed)
Admitting doctor at  The bedisde

## 2020-03-08 NOTE — ED Notes (Signed)
Echo complete

## 2020-03-08 NOTE — ED Notes (Signed)
MS Breakfast Ordered 

## 2020-03-08 NOTE — Procedures (Signed)
Patient Name: Beverly Lawrence  MRN: [id number]  Epilepsy Attending: Charlsie Quest  Referring Physician/Provider: Dr Marvel Plan Date: 03/08/2020 Duration: 25.03 mins  Patient history: 74yo F with transient left sided weakness and numbness. EEG to evaluate for seizure.  Level of alertness: Awake, asleep  AEDs during EEG study: None  Technical aspects: This EEG study was done with scalp electrodes positioned according to the 10-20 International system of electrode placement. Electrical activity was acquired at a sampling rate of 500Hz  and reviewed with a high frequency filter of 70Hz  and a low frequency filter of 1Hz . EEG data were recorded continuously and digitally stored.   Description: The posterior dominant rhythm consists of 8 Hz activity of moderate voltage (25-35 uV) seen predominantly in posterior head regions, symmetric and reactive to eye opening and eye closing. Sleep was characterized by vertex waves, sleep spindles (12 to 14 Hz), maximal frontocentral region. Hyperventilation and photic stimulation were not performed.     IMPRESSION: This study is within normal limits. No seizures or epileptiform discharges were seen throughout the recording.  [name] 

## 2020-03-08 NOTE — H&P (Signed)
History and Physical    Beverly Lawrence JYN:[id number] DOB: Oct 28, 1945 DOA: 03/07/2020  PCP: Malka So., MD (Confirm with patient/family/NH records and if not entered, this has to be entered at [facility] point of entry) Patient coming from: home  I have personally briefly reviewed patient's old medical records in [facility]  Chief Complaint: left sided weakness, nausea  HPI: Beverly Lawrence is a 74 y.o. female with medical history significant of PAF, HTN, chronic loose stools at 2-3 per day, SIADH, hemolytic anemia, h/o GI bleed 2 years ago. She reports that a few years ago she had an episode of bilateral UE weakness when her Na was low. Today she had an episode of left UE weakness and numbness. EMS was called and by report she had facial droop and weakness. Although she gets her care through [facility] she was brought to [facility], as a stroke center, for evaluation of possible acute stroke.   ED Course: Tmax 100, 175.93  HR 92, RR 16. On presentation ED-PA reports a non-focal neurologic exam. Lab revealed Na 118, baseline of 132, glucose of 60, Hgb 9.9 with WBC 3.0. Patient was given amp of D50 for hypoglycemia. She is NOT diabetic. CTA head/neck with no vascular occlusion, no large vessel occlusion. Patient was seen by Dr. Amada Jupiter who felt there was not evidence of CVA with a non-focal exam but was concerned for TIA and recommended MRI brain to complete evaluation. [facility] called to admit patient for continue treatment of hyponatremia and hypoglycemia  Review of Systems: As per HPI otherwise 10 point review of systems negative.    Past Medical History:  Diagnosis Date  . Anemia    WARM AUTOIMMUNE HEMOLYTIC ANEMIA  (diagnosed 11/2014; [facility])  . Anxiety   . Arthritis    currently being seen by rheumatologist for confirmed diagnosing  . Autoimmune hemolytic anemia   . CHF (congestive heart failure) (HCC)    diastolic CHF  . Diverticulitis of colon 1994    with colostomy placed and later reversed  . DVT (deep venous thrombosis) (HCC)   . Dysrhythmia    patient states that she has a "premature beat"  . GERD (gastroesophageal reflux disease)   . Headache   . Heart murmur    moderate MR/AR, moderate pulm HTN 04/2015 echo  . 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 Hx: UTI (urinary tract infection)   . Hypertension   . Hyperthyroidism   . Hyponatremia   . Peritonitis (HCC) 1977   hx of  . PONV (postoperative nausea and vomiting)   . Small bowel obstruction [facility])     Past Surgical History:  Procedure Laterality Date  . APPENDECTOMY  1966  . COLON SURGERY     colostomy placed in 1994 and reversed in 1995  . EYE SURGERY Bilateral 2016   cataracts  . LEG SURGERY Bilateral 2011   vein ablation in both legs per patient  . NASAL SINUS SURGERY Bilateral 05/15/2015   Procedure: ENDOSCOPIC SINUS SURGERY;  Surgeon: Melvenia Beam, MD;  Location: [facility] OR;  Service: ENT;  Laterality: Bilateral;  . OVARIAN CYST REMOVAL Left 1966  . pilonidal cyst  1972  . SINUS ENDO W/FUSION Bilateral 12/20/2014   Procedure: BILATERAL ENDOSCOPIC SINUS SURGERY WITH NAVIGATION/BALLOON DILATION;  Surgeon: Melvenia Beam, MD;  Location: [facility] OR;  Service: ENT;  Laterality: Bilateral;  . small bowel obs surgery    .  VITRECTOMY Left 2015   Soc Hx -  Married 49 years. She has 1 daughter, 2 grandsons. She and her husband moved from [place] 16 years ago to be closer to family. She lives with her Spouse.    reports that she quit smoking about 15 years ago. She has never used smokeless tobacco. She reports current alcohol use of about 2.0 standard drinks of alcohol per week. She reports that she does not use drugs.  Allergies  Allergen Reactions  . Lisinopril Swelling    Facial swelling  . Codeine Other (See Comments)    unknown  . Erythromycin Other (See Comments)    unknown  . Morphine And Related Other (See Comments)    unknown  . Penicillins Hives and  Other (See Comments)    Has patient had a PCN reaction causing immediate rash, facial/tongue/throat swelling, SOB or lightheadedness with hypotension: No Has patient had a PCN reaction causing severe rash involving mucus membranes or skin necrosis: No Has patient had a PCN reaction that required hospitalization No Has patient had a PCN reaction occurring within the last 10 years: No If all of the above answers are "NO", then may proceed with Cephalosporin use.   Marland Kitchen. Percocet [Oxycodone-Acetaminophen] Other (See Comments)    unknown  . Percodan [Oxycodone-Aspirin] Other (See Comments)    unknown    No family history on file.   Prior to Admission medications   Medication Sig Start Date End Date Taking? Authorizing Provider  amLODipine (NORVASC) 10 MG tablet Take 10 mg by mouth daily.    [provider]  diltiazem (DILACOR XR) 180 MG 24 hr capsule Take 180 mg by mouth daily.    [provider]  ELIQUIS 5 MG TABS tablet Take 5 mg by mouth 2 (two) times daily.  05/12/17   [provider]  folic acid (FOLVITE) 1 MG tablet Take 1 mg by mouth 2 (two) times daily. 11/22/14   [provider]  furosemide (LASIX) 20 MG tablet  07/11/17   [provider]  HYDROcodone-acetaminophen (NORCO/VICODIN) 5-325 MG tablet Take 1-2 tablets by mouth every 4 (four) hours as needed for moderate pain or severe pain. Patient not taking: Reported on 08/19/2017 05/16/15   Melvenia BeamGore, Mitchell, MD  labetalol (NORMODYNE) 100 MG tablet Take 100 mg by mouth 2 (two) times daily.    [provider]  ondansetron (ZOFRAN-ODT) 4 MG disintegrating tablet Take 1 tablet (4 mg total) by mouth every 6 (six) hours as needed for nausea, vomiting or refractory nausea / vomiting. Patient not taking: Reported on 08/19/2017 05/16/15   Melvenia BeamGore, Mitchell, MD  oxybutynin (DITROPAN-XL) 5 MG 24 hr tablet Take 5 mg by mouth at bedtime.    [provider]  pantoprazole (PROTONIX) 40 MG tablet Take  40 mg by mouth 2 (two) times daily.  11/28/14   [provider]  Potassium Chloride ER 20 MEQ TBCR TK 1 T PO D 06/15/17   [provider]  predniSONE (DELTASONE) 10 MG tablet Take 20 mg by mouth 3 (three) times daily.    [provider]  promethazine (PHENERGAN) 12.5 MG tablet Take 12.5 mg by mouth every 6 (six) hours as needed for nausea or vomiting.    [provider]  zolpidem (AMBIEN) 5 MG tablet Take 5 mg by mouth at bedtime as needed for sleep.  12/12/14   [provider]    Physical Exam: Vitals:   03/07/20 2233 03/07/20 2241 03/07/20 2243 03/08/20 0154  BP: Marland Kitchen(!)  175/93   133/67  Pulse: 92   72  Resp: 16     Temp: 100 F (37.8 C)     TempSrc: Oral     SpO2: 100% 100%    Weight:      Height:   5\' 3"  (1.6 m)      Vitals:   03/07/20 2233 03/07/20 2241 03/07/20 2243 03/08/20 0154  BP: (!) 175/93   133/67  Pulse: 92   72  Resp: 16     Temp: 100 F (37.8 C)     TempSrc: Oral     SpO2: 100% 100%    Weight:      Height:   5\' 3"  (1.6 m)    General:  Thin but pot-bellied older woman in no distress Eyes: PERRL, lids and conjunctivae normal ENMT: Mucous membranes are moist. Posterior pharynx clear of any exudate or lesions.Edentulous maxillar, very poor dention mandible. Neck: normal, supple, no masses, no thyromegaly Respiratory: decreased BS but clear to auscultation bilaterally, no wheezing, no crackles. Normal respiratory effort. No accessory muscle use.  Cardiovascular: Regular rate and rhythm, no murmurs / rubs / gallops. No extremity edema. 2+ pedal pulses. No carotid bruits.  Abdomen: protuberant,  no tenderness, no masses palpated. No hepatosplenomegaly. Bowel sounds positive.  Musculoskeletal: no clubbing / cyanosis. No joint deformity upper and lower extremities but interosseous wasting noted both hands. Good ROM, no contractures. Decreased muscle tone.  Skin: Distal LE with scaling epidermis and chronic changes. No  lesions Neurologic: CN 2-12  Nl facial symmetry and movement, PERRLA/EOMI, no deviation tongue, nl shoulder shrug. MS 4/5 throughout. Sensation grossly intact. No tremor. Psychiatric: Normal judgment and insight. Alert and oriented x 3. Normal mood.      Labs on Admission: I have personally reviewed following labs and imaging studies  CBC: Recent Labs  Lab 03/07/20 2144 03/07/20 2154  WBC 3.0*  --   NEUTROABS 2.1  --   HGB 9.5* 9.9*  HCT 27.2* 29.0*  MCV 110.6*  --   PLT 163  --    Basic Metabolic Panel: Recent Labs  Lab 03/07/20 2144 03/07/20 2154  NA 119* 118*  K 4.5 4.6  CL 79* 78*  CO2 26  --   GLUCOSE 62* 60*  BUN 5* 5*  CREATININE 0.58 0.50  CALCIUM 8.3*  --    GFR: Estimated Creatinine Clearance: 50.3 mL/min (by C-G formula based on SCr of 0.5 mg/dL). Liver Function Tests: Recent Labs  Lab 03/07/20 2144  AST 41  ALT 17  ALKPHOS 88  BILITOT 2.4*  PROT 6.2*  ALBUMIN 3.3*   No results for input(s): LIPASE, AMYLASE in the last 168 hours. No results for input(s): AMMONIA in the last 168 hours. Coagulation Profile: Recent Labs  Lab 03/07/20 2144  INR 1.2   Cardiac Enzymes: No results for input(s): CKTOTAL, CKMB, CKMBINDEX, TROPONINI in the last 168 hours. BNP (last 3 results) No results for input(s): PROBNP in the last 8760 hours. HbA1C: No results for input(s): HGBA1C in the last 72 hours. CBG: Recent Labs  Lab 03/07/20 2141 03/08/20 0030 03/08/20 0153  GLUCAP 61* 58* 88   Lipid Profile: No results for input(s): CHOL, HDL, LDLCALC, TRIG, CHOLHDL, LDLDIRECT in the last 72 hours. Thyroid Function Tests: No results for input(s): TSH, T4TOTAL, FREET4, T3FREE, THYROIDAB in the last 72 hours. Anemia Panel: No results for input(s): VITAMINB12, FOLATE, FERRITIN, TIBC, IRON, RETICCTPCT in the last 72 hours. Urine analysis:    Component Value Date/Time  COLORURINE YELLOW 03/08/2020 0056   APPEARANCEUR CLEAR 03/08/2020 0056   LABSPEC 1.014  03/08/2020 0056   PHURINE 8.0 03/08/2020 0056   GLUCOSEU 50 (A) 03/08/2020 0056   HGBUR NEGATIVE 03/08/2020 0056   BILIRUBINUR NEGATIVE 03/08/2020 0056   KETONESUR 20 (A) 03/08/2020 0056   PROTEINUR NEGATIVE 03/08/2020 0056   NITRITE NEGATIVE 03/08/2020 0056   LEUKOCYTESUR NEGATIVE 03/08/2020 0056    Radiological Exams on Admission: CT Code Stroke CTA Head W/WO contrast  Result Date: 03/07/2020 CLINICAL DATA:  Left-sided weakness.  Facial droop. EXAM: CT ANGIOGRAPHY HEAD AND NECK TECHNIQUE: Multidetector CT imaging of the head and neck was performed using the standard protocol during bolus administration of intravenous contrast. Multiplanar CT image reconstructions and MIPs were obtained to evaluate the vascular anatomy. Carotid stenosis measurements (when applicable) are obtained utilizing NASCET criteria, using the distal internal carotid diameter as the denominator. CONTRAST:  42mL OMNIPAQUE IOHEXOL 350 MG/ML SOLN COMPARISON:  None. FINDINGS: CTA NECK FINDINGS Aortic arch: Standard 3 vessel aortic arch with mild atherosclerotic plaque. No evidence of a significant arch vessel origin stenosis although note that the brachiocephalic artery origin was incompletely imaged. Right carotid system: Patent with mild calcified plaque at the carotid bifurcation. No evidence of significant stenosis or dissection. Left carotid system: Patent with moderate calcified plaque about the carotid bifurcation. No evidence of significant stenosis or dissection. Vertebral arteries: Patent and codominant. Calcified plaque at the right vertebral artery origin without evidence of significant stenosis. Skeleton: Focally advanced disc degeneration at C6-7. Other neck: Mild asymmetric enlargement and increased density of the left parotid gland without a discrete mass or surrounding inflammatory change. No evidence of cervical lymphadenopathy. Upper chest: Biapical emphysema, asymmetrically advanced on the right. Review of the MIP  images confirms the above findings CTA HEAD FINDINGS Anterior circulation: The internal carotid arteries are patent from skull base to carotid termini with mild atherosclerotic plaque bilaterally not resulting in significant stenosis. ACAs and MCAs are patent with mild branch vessel irregularity but no evidence of a proximal branch occlusion or significant proximal stenosis. A small fenestration is questioned in the left M1 segment, an anatomic variant. No aneurysm is identified. Posterior circulation: The intracranial vertebral arteries are widely patent to the basilar. Patent PICA and SCA origins are seen bilaterally. The basilar artery is widely patent. Posterior communicating arteries are not identified and may be diminutive or absent. Both PCAs are patent without evidence of a significant proximal stenosis. No aneurysm is identified. Venous sinuses: Patent with the left transverse and sigmoid sinuses being hypoplastic. Anatomic variants: None of significance. Review of the MIP images confirms the above findings IMPRESSION: 1. No large vessel occlusion. 2. Mild atherosclerosis in the head and neck without a significant stenosis. 3. Aortic Atherosclerosis (ICD10-I70.0) and Emphysema (ICD10-J43.9). These results were communicated to Dr. Amada Jupiter at 10:25 pm on 03/07/2020 by text page via the [facility] messaging system. Electronically Signed   By: Sebastian Ache M.D.   On: 03/07/2020 22:36   CT Code Stroke CTA Neck W/WO contrast  Result Date: 03/07/2020 CLINICAL DATA:  Left-sided weakness.  Facial droop. EXAM: CT ANGIOGRAPHY HEAD AND NECK TECHNIQUE: Multidetector CT imaging of the head and neck was performed using the standard protocol during bolus administration of intravenous contrast. Multiplanar CT image reconstructions and MIPs were obtained to evaluate the vascular anatomy. Carotid stenosis measurements (when applicable) are obtained utilizing NASCET criteria, using the distal internal carotid diameter as the  denominator. CONTRAST:  21mL OMNIPAQUE IOHEXOL 350 MG/ML SOLN COMPARISON:  None. FINDINGS: CTA NECK FINDINGS Aortic arch: Standard 3 vessel aortic arch with mild atherosclerotic plaque. No evidence of a significant arch vessel origin stenosis although note that the brachiocephalic artery origin was incompletely imaged. Right carotid system: Patent with mild calcified plaque at the carotid bifurcation. No evidence of significant stenosis or dissection. Left carotid system: Patent with moderate calcified plaque about the carotid bifurcation. No evidence of significant stenosis or dissection. Vertebral arteries: Patent and codominant. Calcified plaque at the right vertebral artery origin without evidence of significant stenosis. Skeleton: Focally advanced disc degeneration at C6-7. Other neck: Mild asymmetric enlargement and increased density of the left parotid gland without a discrete mass or surrounding inflammatory change. No evidence of cervical lymphadenopathy. Upper chest: Biapical emphysema, asymmetrically advanced on the right. Review of the MIP images confirms the above findings CTA HEAD FINDINGS Anterior circulation: The internal carotid arteries are patent from skull base to carotid termini with mild atherosclerotic plaque bilaterally not resulting in significant stenosis. ACAs and MCAs are patent with mild branch vessel irregularity but no evidence of a proximal branch occlusion or significant proximal stenosis. A small fenestration is questioned in the left M1 segment, an anatomic variant. No aneurysm is identified. Posterior circulation: The intracranial vertebral arteries are widely patent to the basilar. Patent PICA and SCA origins are seen bilaterally. The basilar artery is widely patent. Posterior communicating arteries are not identified and may be diminutive or absent. Both PCAs are patent without evidence of a significant proximal stenosis. No aneurysm is identified. Venous sinuses: Patent with  the left transverse and sigmoid sinuses being hypoplastic. Anatomic variants: None of significance. Review of the MIP images confirms the above findings IMPRESSION: 1. No large vessel occlusion. 2. Mild atherosclerosis in the head and neck without a significant stenosis. 3. Aortic Atherosclerosis (ICD10-I70.0) and Emphysema (ICD10-J43.9). These results were communicated to Dr. Amada Jupiter at 10:25 pm on 03/07/2020 by text page via the [facility] messaging system. Electronically Signed   By: Sebastian Ache M.D.   On: 03/07/2020 22:36   CT HEAD CODE STROKE WO CONTRAST  Result Date: 03/07/2020 CLINICAL DATA:  Code stroke.  Left-sided weakness.  Facial droop. EXAM: CT HEAD WITHOUT CONTRAST TECHNIQUE: Contiguous axial images were obtained from the base of the skull through the vertex without intravenous contrast. COMPARISON:  Head CT 01/11/2018 and MRI 08/20/2014 FINDINGS: Brain: There is no evidence of an acute infarct, intracranial hemorrhage, mass, midline shift, or extra-axial fluid collection. Mild cerebral atrophy is within normal limits for age. Hypodensities in the cerebral white matter bilaterally are nonspecific but compatible with mild chronic small vessel ischemic disease, stable to slightly progressed. 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 Vascular: Calcified atherosclerosis at the skull base. No hyperdense vessel. Skull: No fracture or suspicious osseous lesion. Sinuses/Orbits: Prior endoscopic sinus surgery. Extensive chronic sinusitis with circumferential mucosal thickening in the maxillary sinuses, near complete opacification of the sphenoid sinuses, complete opacification of the right frontal sinus, and moderate bilateral ethmoid sinus mucosal thickening. 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 Small chronic effusion at the left mastoid tip. Bilateral cataract extraction. Other: None. ASPECTS [facility] Stroke Program Early CT Score) - Ganglionic level  infarction (caudate, lentiform nuclei, internal capsule, insula, M1-M3 cortex): 7 - Supraganglionic infarction (M4-M6 cortex): 3 Total score (0-10 with 10 being normal): 10 IMPRESSION: 1. No evidence of acute intracranial abnormality. 2. ASPECTS is 10. 3. Mild chronic small vessel ischemic disease. These results were communicated to Dr. Amada Jupiter at  10:05 pm on 03/07/2020 by text page via the [facility] messaging system. Electronically Signed   By: Sebastian Ache M.D.   On: 03/07/2020 22:06    EKG: Independently reviewed. Sinus Rhythm, increased PR interval, old anteroseptal injury  Assessment/Plan Active Problems:   TIA (transient ischemic attack)   Hypoglycemia without diagnosis of diabetes mellitus   Hypertension   (HFpEF) heart failure with preserved ejection fraction (HCC)   Autoimmune hemolytic anemia   Hyponatremia  (please populate well all problems here in Problem List. (For example, if patient is on BP meds at home and you resume or decide to hold them, it is a problem that needs to be her. Same for CAD, COPD, HLD and so on)   1. 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 She has had poor food intake but has been drinking free water, pediolyte. She also has chronic loose stools. Her transient focal weakness may have been related to hyponatremia Plan NS at 50cc/hr x 12 hrs  Bmet in AM  No change in meds  2. Hypoglycemia - no h/o DM, takes no hyperglycemia meds. Poor calorie intake for > 1 week Plan D5NS at 50 cc/hr  Recheck Bmet in AM  3.HFpEF - see [facility]. Plan - continue home medications  4. PAF - per outside hx. She had been on Eliquis but this was stopped by cardiology and GI after GI bleed. 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 She takes no anticoagulant. Plan Tele monitoring   5. TIA - transient weakness with non-focal exam on presentation. CTA negative for any occlusive disease. Patient has been seen by neuro. Plan MRI brain to  complete w/u  Will defer to Neuro as to tx given hx of GI bleed and having been taken off Eiliquis by   Her medical team  6. Auto-immune hemolytic anemia - current Hgb is stable for her. She reports that her hematologist stopped her prednisone approximately 1 year ago. Plan She will follow up with her hematologist.   7. Code status - prior Full code   DVT prophylaxis: lovenox  Code Status: full code  Family Communication: left msg for Mr. Sol on cell phone  Disposition Plan: home 224-48 hrs  Consults called: Neurology - Dr, Amada Jupiter  Admission status: observaiton/tele    Illene Regulus MD [facility] Pager [phone]  If 7PM-7AM, please contact night-coverage [email or web address] Password [email or web address]  03/08/2020, 2:05 AM

## 2020-03-08 NOTE — ED Notes (Signed)
Admitting doctor at  The bedside  The other md that just left was the neurologist

## 2020-03-08 NOTE — Progress Notes (Signed)
Pharmacy Antibiotic Note  Beverly Lawrence is a 74 y.o. female admitted on 03/07/2020. Pharmacy has been consulted for vancomycin dosing for cellulitis. Pt with Tmax 100 and WBC is slightly low at 3. SCr is WNL.   Plan: Vancomycin 1gm IV Q24H F/u renal fxn, C&S, clinical status and trough at SS  Height: 5\' 3"  (160 cm) Weight: 57.5 kg (126 lb 12.2 oz) IBW/kg (Calculated) : 52.4  Temp (24hrs), Avg:99.3 F (37.4 C), Min:98.5 F (36.9 C), Max:100 F (37.8 C)  Recent Labs  Lab 03/07/20 2144 03/07/20 2154 03/08/20 1409  WBC 3.0*  --   --   CREATININE 0.58 0.50 0.63    Estimated Creatinine Clearance: 50.3 mL/min (by C-G formula based on SCr of 0.63 mg/dL).    Allergies  Allergen Reactions  . Lisinopril Swelling    Facial swelling  . Codeine Nausea And Vomiting  . Morphine And Related Nausea And Vomiting  . Percocet [Oxycodone-Acetaminophen] Nausea And Vomiting  . Percodan [Oxycodone-Aspirin] Nausea And Vomiting  . Alendronate Other (See Comments)    Trouble swallowing  . Hydrochlorothiazide Other (See Comments)    Hyponatremia. - reported by [facility] 07/03/2017  . Erythromycin Other (See Comments)    Unknown reaction  . Penicillins Hives and Other (See Comments)    Has patient had a PCN reaction causing immediate rash, facial/tongue/throat swelling, SOB or lightheadedness with hypotension: No Has patient had a PCN reaction causing severe rash involving mucus membranes or skin necrosis: No Has patient had a PCN reaction that required hospitalization No Has patient had a PCN reaction occurring within the last 10 years: No If all of the above answers are "NO", then may proceed with Cephalosporin use.     Antimicrobials this admission: Vanc 9/4>>  Dose adjustments this admission: N/A  Microbiology results: Pending Thank you for allowing pharmacy to be a part of this patient's care.  Jalana Moore, 07/05/2017 03/08/2020 7:20 PM

## 2020-03-08 NOTE — ED Notes (Signed)
Pt bgl is low- MD made aware- told to give pt juice and MD will put in orders

## 2020-03-08 NOTE — ED Notes (Signed)
eeg person here

## 2020-03-08 NOTE — Progress Notes (Signed)
  Echocardiogram 2D Echocardiogram has been performed.  Beverly Lawrence 03/08/2020, 11:32 AM

## 2020-03-08 NOTE — ED Notes (Signed)
Pt in MRI- will go back to rm 39 after MRI, transport made aware

## 2020-03-09 DIAGNOSIS — D693 Immune thrombocytopenic purpura: Secondary | ICD-10-CM | POA: Diagnosis present

## 2020-03-09 DIAGNOSIS — K219 Gastro-esophageal reflux disease without esophagitis: Secondary | ICD-10-CM | POA: Diagnosis present

## 2020-03-09 DIAGNOSIS — D61818 Other pancytopenia: Secondary | ICD-10-CM | POA: Diagnosis present

## 2020-03-09 DIAGNOSIS — I480 Paroxysmal atrial fibrillation: Secondary | ICD-10-CM | POA: Diagnosis present

## 2020-03-09 DIAGNOSIS — I4892 Unspecified atrial flutter: Secondary | ICD-10-CM | POA: Diagnosis present

## 2020-03-09 DIAGNOSIS — R197 Diarrhea, unspecified: Secondary | ICD-10-CM | POA: Diagnosis not present

## 2020-03-09 DIAGNOSIS — K567 Ileus, unspecified: Secondary | ICD-10-CM | POA: Diagnosis not present

## 2020-03-09 DIAGNOSIS — E8770 Fluid overload, unspecified: Secondary | ICD-10-CM | POA: Diagnosis present

## 2020-03-09 DIAGNOSIS — I110 Hypertensive heart disease with heart failure: Secondary | ICD-10-CM | POA: Diagnosis present

## 2020-03-09 DIAGNOSIS — F419 Anxiety disorder, unspecified: Secondary | ICD-10-CM | POA: Diagnosis present

## 2020-03-09 DIAGNOSIS — L03115 Cellulitis of right lower limb: Secondary | ICD-10-CM | POA: Diagnosis present

## 2020-03-09 DIAGNOSIS — D591 Autoimmune hemolytic anemia, unspecified: Secondary | ICD-10-CM | POA: Diagnosis present

## 2020-03-09 DIAGNOSIS — I5032 Chronic diastolic (congestive) heart failure: Secondary | ICD-10-CM | POA: Diagnosis present

## 2020-03-09 DIAGNOSIS — Z86718 Personal history of other venous thrombosis and embolism: Secondary | ICD-10-CM | POA: Diagnosis not present

## 2020-03-09 DIAGNOSIS — Z87891 Personal history of nicotine dependence: Secondary | ICD-10-CM | POA: Diagnosis not present

## 2020-03-09 DIAGNOSIS — Z20822 Contact with and (suspected) exposure to covid-19: Secondary | ICD-10-CM | POA: Diagnosis present

## 2020-03-09 DIAGNOSIS — R29810 Facial weakness: Secondary | ICD-10-CM | POA: Diagnosis present

## 2020-03-09 DIAGNOSIS — R933 Abnormal findings on diagnostic imaging of other parts of digestive tract: Secondary | ICD-10-CM | POA: Diagnosis not present

## 2020-03-09 DIAGNOSIS — E861 Hypovolemia: Secondary | ICD-10-CM | POA: Diagnosis present

## 2020-03-09 DIAGNOSIS — Z8673 Personal history of transient ischemic attack (TIA), and cerebral infarction without residual deficits: Secondary | ICD-10-CM | POA: Diagnosis not present

## 2020-03-09 DIAGNOSIS — E162 Hypoglycemia, unspecified: Secondary | ICD-10-CM | POA: Diagnosis not present

## 2020-03-09 DIAGNOSIS — G459 Transient cerebral ischemic attack, unspecified: Secondary | ICD-10-CM | POA: Diagnosis not present

## 2020-03-09 DIAGNOSIS — R4182 Altered mental status, unspecified: Secondary | ICD-10-CM | POA: Diagnosis present

## 2020-03-09 DIAGNOSIS — I10 Essential (primary) hypertension: Secondary | ICD-10-CM | POA: Diagnosis not present

## 2020-03-09 DIAGNOSIS — E222 Syndrome of inappropriate secretion of antidiuretic hormone: Secondary | ICD-10-CM | POA: Diagnosis present

## 2020-03-09 DIAGNOSIS — Z885 Allergy status to narcotic agent status: Secondary | ICD-10-CM | POA: Diagnosis not present

## 2020-03-09 DIAGNOSIS — E2740 Unspecified adrenocortical insufficiency: Secondary | ICD-10-CM | POA: Diagnosis present

## 2020-03-09 DIAGNOSIS — D5911 Warm autoimmune hemolytic anemia: Secondary | ICD-10-CM | POA: Diagnosis present

## 2020-03-09 DIAGNOSIS — E876 Hypokalemia: Secondary | ICD-10-CM | POA: Diagnosis not present

## 2020-03-09 DIAGNOSIS — K580 Irritable bowel syndrome with diarrhea: Secondary | ICD-10-CM | POA: Diagnosis not present

## 2020-03-09 DIAGNOSIS — E871 Hypo-osmolality and hyponatremia: Secondary | ICD-10-CM | POA: Diagnosis not present

## 2020-03-09 DIAGNOSIS — L03116 Cellulitis of left lower limb: Secondary | ICD-10-CM | POA: Diagnosis present

## 2020-03-09 LAB — BASIC METABOLIC PANEL
Anion gap: 4 — ABNORMAL LOW (ref 5–15)
BUN: 7 mg/dL — ABNORMAL LOW (ref 8–23)
CO2: 30 mmol/L (ref 22–32)
Calcium: 8 mg/dL — ABNORMAL LOW (ref 8.9–10.3)
Chloride: 93 mmol/L — ABNORMAL LOW (ref 98–111)
Creatinine, Ser: 0.62 mg/dL (ref 0.44–1.00)
GFR calc Af Amer: >60 mL/min (ref >60)
GFR calc non Af Amer: >60 mL/min (ref >60)
Glucose, Bld: 117 mg/dL — ABNORMAL HIGH (ref 70–99)
Potassium: 4.5 mmol/L (ref 3.5–5.1)
Sodium: 127 mmol/L — ABNORMAL LOW (ref 135–145)

## 2020-03-09 LAB — CBC
HCT: 23.3 % — ABNORMAL LOW (ref 36.0–46.0)
Hemoglobin: 7.8 g/dL — ABNORMAL LOW (ref 12.0–15.0)
MCH: 39.2 pg — ABNORMAL HIGH (ref 26.0–34.0)
MCHC: 33.5 g/dL (ref 30.0–36.0)
MCV: 117.1 fL — ABNORMAL HIGH (ref 80.0–100.0)
Platelets: 122 10*3/uL — ABNORMAL LOW (ref 150–400)
RBC: 1.99 MIL/uL — ABNORMAL LOW (ref 3.87–5.11)
RDW: 14.4 % (ref 11.5–15.5)
WBC: 2.5 10*3/uL — ABNORMAL LOW (ref 4.0–10.5)
nRBC: 0 % (ref 0.0–0.2)

## 2020-03-09 LAB — ACTH STIMULATION, 3 TIME POINTS
Cortisol, 30 Min: 11.3 ug/dL
Cortisol, 60 Min: 11.4 ug/dL
Cortisol, Base: 11.1 ug/dL

## 2020-03-09 LAB — URINE CULTURE: Culture: 60000 — AB

## 2020-03-09 NOTE — Progress Notes (Signed)
STROKE TEAM PROGRESS NOTE   INTERVAL HISTORY No family at bedside. Pt lying in bed, awake alert and stated a good sleep last night. She denies any distress or discomfort. Denies any bleeding. Dr. Dartha Lodge found pt to have cellulitis at [facility], put on vancomycin. Her Hb down to 7.8 today, Dr. Dartha Lodge thinks more due to hemodilution, will continue to monitor.   OBJECTIVE Vitals:   03/08/20 2200 03/09/20 0215 03/09/20 0424 03/09/20 0508  BP: (!) 95/43 111/65 100/60 125/69  Pulse: 69 69 (!) 58 69  Resp:   18 16  Temp:   98.9 F (37.2 C) 98.6 F (37 C)  TempSrc:   Oral Oral  SpO2: 100% 100% 98% 100%  Weight:      Height:        CBC:  Recent Labs  Lab 03/07/20 2144 03/07/20 2144 03/07/20 2154 03/09/20 0702  WBC 3.0*  --   --  2.5*  NEUTROABS 2.1  --   --   --   HGB 9.5*   < > 9.9* 7.8*  HCT 27.2*   < > 29.0* 23.3*  MCV 110.6*  --   --  117.1*  PLT 163  --   --  122*   < > = values in this interval not displayed.    Basic Metabolic Panel:  Recent Labs  Lab 03/08/20 2203 03/09/20 0702  NA 125* 127*  K 3.9 4.5  CL 90* 93*  CO2 30 30  GLUCOSE 147* 117*  BUN 6* 7*  CREATININE 0.57 0.62  CALCIUM 7.8* 8.0*    Lipid Panel:     Component Value Date/Time   CHOL 155 03/08/2020 1409   TRIG 74 03/08/2020 1409   HDL 91 03/08/2020 1409   CHOLHDL 1.7 03/08/2020 1409   VLDL 15 03/08/2020 1409   LDLCALC 49 03/08/2020 1409   HgbA1c:  Lab Results  Component Value Date   HGBA1C <3.5 (L) 03/08/2020   Urine Drug Screen:     Component Value Date/Time   LABOPIA NONE DETECTED 03/08/2020 0056   COCAINSCRNUR NONE DETECTED 03/08/2020 0056   LABBENZ NONE DETECTED 03/08/2020 0056   AMPHETMU NONE DETECTED 03/08/2020 0056   THCU NONE DETECTED 03/08/2020 0056   LABBARB NONE DETECTED 03/08/2020 0056    Alcohol Level     Component Value Date/Time   ETH 57 (H) 03/07/2020 2144    IMAGING  CT Code Stroke CTA Head W/WO contrast CT Code Stroke CTA Neck W/WO  contrast 03/07/2020 IMPRESSION:  1. No large vessel occlusion.  2. Mild atherosclerosis in the head and neck without a significant stenosis.  3. Aortic Atherosclerosis (ICD10-I70.0) and Emphysema (ICD10-J43.9).   MR BRAIN WO CONTRAST 03/08/2020 IMPRESSION:  1. No acute intracranial abnormality.  2. Generalized atrophy and findings of chronic microvascular disease.   CT HEAD CODE STROKE WO CONTRAST 03/07/2020 IMPRESSION:  1. No evidence of acute intracranial abnormality.  2. ASPECTS is 10.  3. Mild chronic small vessel ischemic disease.   EEG 03/08/20 IMPRESSION: This study is within normal limits. No seizures or epileptiform discharges were seen throughout the recording.    PHYSICAL EXAM   Temp:  [98.5 F (36.9 C)-98.9 F (37.2 C)] 98.6 F (37 C) (09/05 0508) Pulse Rate:  [58-84] 69 (09/05 0508) Resp:  [15-18] 16 (09/05 0508) BP: (91-125)/(42-86) 125/69 (09/05 0508) SpO2:  [95 %-100 %] 100 % (09/05 0508)  General - Well nourished, well developed, in no apparent distress.  Ophthalmologic - fundi not visualized due to noncooperation.  Cardiovascular - Regular rhythm and rate.  Mental Status -  Level of arousal and orientation to time, place, and person were intact. Language including expression, naming, repetition, comprehension was assessed and found intact. Fund of Knowledge was assessed and was intact.  Cranial Nerves II - XII - II - Visual field intact OU. III, IV, VI - Extraocular movements intact. V - Facial sensation intact bilaterally. VII - Facial movement intact bilaterally. VIII - Hearing & vestibular intact bilaterally. X - Palate elevates symmetrically. XI - Chin turning & shoulder shrug intact bilaterally. XII - Tongue protrusion intact.  Motor Strength - The patient's strength was normal in all extremities and pronator drift was absent except right knee flexion 4 -/5 due to arthritis.  Bulk was normal and fasciculations were absent.   Motor Tone -  Muscle tone was assessed at the neck and appendages and was normal.  Reflexes - The patient's reflexes were symmetrical in all extremities and she had no pathological reflexes.  Sensory - Light touch, temperature/pinprick were assessed and were symmetrical.    Coordination - The patient had normal movements in the hands with no ataxia or dysmetria.  Tremor was absent.  Gait and Station - deferred.    ASSESSMENT/PLAN Beverly Lawrence is a 74 y.o. female with history of DVT s/p IVC filter, HTN, chronic hyponatremia, CHF, autoimmune hemolytic anemia, TIA's, atrial fibrillation status post cardioversion (not on anticoagulation due to severe anemia), upper GI bleeding 2019 who presents with left-sided weakness and numbness. She did not receive IV t-PA due to mild deficits.  Transient episode of left sided deficit - hypoglycemia vs. hyponatremia vs. TIA given PAF not on anticoagulation  Code Stroke CT Head - No evidence of acute intracranial abnormality. ASPECTS is 10. Mild chronic small vessel ischemic disease.   MRI head - No acute intracranial abnormality. Generalized atrophy and findings of chronic microvascular disease.   CTA H&N - No large vessel occlusion. Mild atherosclerosis in the head and neck without a significant stenosis.   2D Echo EF 60 to 65%  [employer] Virus 2 - negative  EEG - 03/08/20 - This study is within normal limits.   LDL 49  HgbA1c <3.5  UDS - negative  VTE prophylaxis - SCDs  No antithrombotic prior to admission, now on Plavix. Continue on discharge.   Ongoing aggressive stroke risk factor management  Therapy recommendations:  pending  Disposition:  Pending  Hx of afib/aflutter  Status post cardioversion 02/2016  Was on Eliquis, however discontinued due to severe anemia  Follows with cardiology Dr. Desma Maxim in [facility]  On Cardizem  Currently on Plavix.   Given anemia, not a good candidate for [facility] at this time.  Recommend to discuss with GI  and hematology provider when becomes a candidate for [facility] in the future.  Hx of autoimmune hemolytic anemia  Longstanding history of severe anemia  Was on prednisone, discontinued due to GI bleeding  Had most recent blood transfusion in 08/2019 due to severe anemia  This admission hemoglobin 9.9->7.8  Currently on Plavix  Close H&H monitoring  Hx of UGIB  05/2018 upper GI bleeding status post EGD showed a large duodenal bleeding ulcer due to overdose of ibuprofen and prednisone use  Cauterized and treated with PPI  No more GI bleeding since  Currently on Plavix   History of bilateral DVT  Status post IVC filter in 2018  ?  PE at that time  Not on [facility] due to severe anemia  Hypotension Hx of hypertension  Home BP meds: amlodipine ; diltiazem ; labetalol, Lasix, metoprolol  Current BP meds: diltiazem  BP on the low side - 90's - 110's . Long-term BP goal normotensive  Chronic hyponatremia  ? SIADH  Na 118 -> 123->125->127  EEG - This study is within normal limits. No seizures or epileptiform discharges were seen throughout the recording.  Treatment per primary team  Hypoglycemia  Glucose 62->88->76->147->117  Hemoglobin A1c < 3.5  Off D5 normal saline  On diet  Close monitoring  Other Stroke Risk Factors  Advanced age  Former cigarette smoker - quit  ETOH use, advised to drink no more than 1 alcoholic beverage per day.  Hx of TIAs  Diastolic chronic heart Failure  PVD  CAD  Other Active Problems  Code status - Full code  Pancytopenia - Leukopenia - 3.0->2.5 and thrombocytopenia 163->122  Aortic Atherosclerosis (ICD10-I70.0)    LE cellulitis - on vancomycin  Hospital day # 0  I have discussed with Dr. Dartha Lodge.   Marvel Plan, MD PhD Stroke Neurology 03/09/2020 11:15 AM       To contact Stroke Continuity provider, please refer to [email or web address]. After hours, contact General Neurology

## 2020-03-09 NOTE — Progress Notes (Signed)
PROGRESS NOTE    Beverly Lawrence  UJW:[id number] DOB: 12/31/1945 DOA: 03/07/2020 PCP: Malka So., MD  Outpatient Specialists:   Brief Narrative:  Patient is a 74 year old female with past medical history significant for PAF, HTN, chronic loose stools at 2-3 per day, SIADH, hemolytic anemia, h/o GI bleed 2 years ago.  Patient was admitted with concerns for possible TIA.  Patient reported left-sided weakness and feeling weak generally for the last 1 week.  Neurology team is directing care.  MRI brain has not shown any acute changes.  CTA head and neck has not shown any acute changes.  Neurology is directing care.  Despite prior history of atrial fibrillation, due to other complicating medical conditions, including but not limited to ITP with anemia and prior GI bleed, neurology has decided not to start patient on anticoagulation.  Patient will follow up with PCP and heme oncology team to decide suitability for anticoagulation.  Patient was also noted to have bilateral lower leg cellulitis, worse on the right side and started on IV vancomycin.  Cellulitis has improved significantly.  No fever or chills.  Not associated constitutional symptoms.  Will start patient on antibiotics for the cellulitis.  Low blood sugar was noted on presentation, with low cortisol (10).  Apparently, patient has been on steroids in the past for ITP, but quit steroid use a long time ago.  Cosyntropin stimulation test done earlier today did not result in any significant rise in cortisol.  There will be need for further work-up.  Sodium is also noted to be low, 118.  Patient has chronic hyponatremia.  Patient was also on Lasix prior to presentation.  Will work-up hyponatremia.  Assessment & Plan:   Active Problems:   Hypertension   Autoimmune hemolytic anemia   TIA (transient ischemic attack)   Hypoglycemia without diagnosis of diabetes mellitus   (HFpEF) heart failure with preserved ejection fraction (HCC)    Hyponatremia  1. 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 She has had poor food intake but has been drinking free water, pediolyte. She also has chronic loose stools. Her transient focal weakness may have been related to hyponatremia Plan     NS at 50cc/hr x 12 hrs             Bmet in AM             No change in meds 03/09/2020: Patient has chronic hyponatremia.  Patient was on Lasix prior to admission.  Patient has low cortisol level that did not respond to ACTH.  Lasix has been discontinued.  Urine sodium, urine and serum osmolality noted.  Gentle hydration.  Sodium is improved to 127.  2. Hypoglycemia - no h/o DM, takes no hyperglycemia meds. Poor calorie intake for > 1 week Plan     D5NS at 50 cc/hr             Recheck Bmet in AM 03/09/2020: Concerns for possible adrenal insufficiency.  No response to ACTH.  Will need further work-up, including repeating ACTH level.  3.HFpEF - see [facility]. Plan - continue home medications 03/09/2020: Stable.  4. PAF - per outside hx. She had been on Eliquis but this was stopped by cardiology and GI after GI bleed. 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 She takes no anticoagulant. Plan     Tele monitoring  03/09/2020: Not anticoagulation candidate as per neurology.  Currently on Plavix.  5. TIA - transient  weakness with non-focal exam on presentation. CTA negative for any occlusive disease. Patient has been seen by neuro. Plan     MRI brain to complete w/u             Will defer to Neuro as to tx given hx of GI bleed and having been taken off Eiliquis by                         Her medical team  03/09/2020: MRI has not shown any new changes.  CTA head and neck are both nonrevealing.  Neurology is directing.  Patient is currently on Plavix.  Neurology has decided against anticoagulation for now.  Patient will follow with PCP and heme oncology team (see documentation above).    6. Auto-immune hemolytic anemia -  current Hgb is stable for her. She reports that her hematologist stopped her prednisone approximately 1 year ago. 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  Will need further work-up.  See above documentation.  DVT prophylaxis: Subacute Lovenox Code Status: Full code Family Communication: Daughter Disposition Plan: Next 1 to 2 days   Consultants:   Neurology  Procedures:   None  Antimicrobials:   IV vancomycin   Subjective: No new complaints. Cellulitis is improving. No motor deficits.  Objective: Vitals:   03/09/20 1115 03/09/20 1116 03/09/20 1118 03/09/20 1544  BP: (!) 141/67 131/69 136/76 104/62  Pulse: 79 92 92 67  Resp: 16 (!) 22 18 18   Temp:    98.4 F (36.9 C)  TempSrc:    Oral  SpO2: 100% 97% 100% 99%  Weight:      Height:        Intake/Output Summary (Last 24 hours) at 03/09/2020 1915 Last data filed at 03/08/2020 2258 Gross per 24 hour  Intake 1701.71 ml  Output 450 ml  Net 1251.71 ml   Filed Weights   03/07/20 2100  Weight: 57.5 kg    Examination:  General exam: Appears calm and comfortable  Respiratory system: Clear to auscultation.  Cardiovascular system: S1 & S2 heard. Gastrointestinal system: Abdomen is nondistended, soft and nontender. No organomegaly or masses felt. Normal bowel sounds heard. Central nervous system: Alert and oriented. No focal neurological deficits. Extremities: No leg edema.  Cellulitis has improved significantly  Data Reviewed: I have personally reviewed following labs and imaging studies  CBC: Recent Labs  Lab 03/07/20 2144 03/07/20 2154 03/09/20 0702  WBC 3.0*  --  2.5*  NEUTROABS 2.1  --   --   HGB 9.5* 9.9* 7.8*  HCT 27.2* 29.0* 23.3*  MCV 110.6*  --  117.1*  PLT 163  --  122*   Basic Metabolic Panel: Recent Labs  Lab 03/07/20 2144 03/07/20 2154 03/08/20 1409 03/08/20 2203 03/09/20 0702  NA 119* 118* 123* 125* 127*  K 4.5 4.6  4.8 3.9 4.5  CL 79* 78* 86* 90* 93*  CO2 26  --  28 30 30   GLUCOSE 62* 60* 101* 147* 117*  BUN 5* 5* 7* 6* 7*  CREATININE 0.58 0.50 0.63 0.57 0.62  CALCIUM 8.3*  --  8.0* 7.8* 8.0*   GFR: Estimated Creatinine Clearance: 50.3 mL/min (by C-G formula based on SCr of 0.62 mg/dL). Liver Function Tests: Recent Labs  Lab 03/07/20 2144  AST 41  ALT 17  ALKPHOS 88  BILITOT 2.4*  PROT 6.2*  ALBUMIN 3.3*  No results for input(s): LIPASE, AMYLASE in the last 168 hours. No results for input(s): AMMONIA in the last 168 hours. Coagulation Profile: Recent Labs  Lab 03/07/20 2144  INR 1.2   Cardiac Enzymes: No results for input(s): CKTOTAL, CKMB, CKMBINDEX, TROPONINI in the last 168 hours. BNP (last 3 results) No results for input(s): PROBNP in the last 8760 hours. HbA1C: Recent Labs    03/08/20 1406  HGBA1C <3.5*   CBG: Recent Labs  Lab 03/07/20 2141 03/08/20 0030 03/08/20 0153 03/08/20 0545 03/08/20 1829  GLUCAP 61* 58* 88 76 124*   Lipid Profile: Recent Labs    03/08/20 1409  CHOL 155  HDL 91  LDLCALC 49  TRIG 74  CHOLHDL 1.7   Thyroid Function Tests: Recent Labs    03/08/20 1409  TSH 3.694   Anemia Panel: No results for input(s): VITAMINB12, FOLATE, FERRITIN, TIBC, IRON, RETICCTPCT in the last 72 hours. Urine analysis:    Component Value Date/Time   COLORURINE YELLOW 03/08/2020 0056   APPEARANCEUR CLEAR 03/08/2020 0056   LABSPEC 1.014 03/08/2020 0056   PHURINE 8.0 03/08/2020 0056   GLUCOSEU 50 (A) 03/08/2020 0056   HGBUR NEGATIVE 03/08/2020 0056   BILIRUBINUR NEGATIVE 03/08/2020 0056   KETONESUR 20 (A) 03/08/2020 0056   PROTEINUR NEGATIVE 03/08/2020 0056   NITRITE NEGATIVE 03/08/2020 0056   LEUKOCYTESUR NEGATIVE 03/08/2020 0056   Sepsis Labs: @LABRCNTIP (procalcitonin:4,lacticidven:4)  ) Recent Results (from the past 240 hour(s))  Urine culture     Status: Abnormal   Collection Time: 03/08/20 12:45 AM   Specimen: Urine, Random  Result Value  Ref Range Status   Specimen Description URINE, RANDOM  Final   Special Requests   Final    NONE Performed at [facility], [address], [place], [place] [address]    Culture (A)  Final    60,000 COLONIES/mL MULTIPLE SPECIES PRESENT, SUGGEST RECOLLECTION   Report Status 03/09/2020 FINAL  Final  SARS Coronavirus 2 by RT PCR (hospital order, performed in [facility] hospital lab) Nasopharyngeal Nasopharyngeal Swab     Status: None   Collection Time: 03/08/20  1:00 AM   Specimen: Nasopharyngeal Swab  Result Value Ref Range Status   SARS Coronavirus 2 NEGATIVE NEGATIVE Final    Comment: (NOTE) SARS-CoV-2 target nucleic acids are NOT DETECTED.  The SARS-CoV-2 RNA is generally detectable in upper and lower respiratory specimens during the acute phase of infection. The lowest concentration of SARS-CoV-2 viral copies this assay can detect is 250 copies / mL. A negative result does not preclude SARS-CoV-2 infection and should not be used as the sole basis for treatment or other patient management decisions.  A negative result may occur with improper specimen collection / handling, submission of specimen other than nasopharyngeal swab, presence of viral mutation(s) within the areas targeted by this assay, and inadequate number of viral copies (<250 copies / mL). A negative result must be combined with clinical observations, patient history, and epidemiological information.  Fact Sheet for Patients:   BoilerBrush.com.cy  Fact Sheet for Healthcare Providers: https://pope.com/  This test is not yet approved or  cleared by the [place] FDA and has been authorized for detection and/or diagnosis of SARS-CoV-2 by FDA under an Emergency Use Authorization (EUA).  This EUA will remain in effect (meaning this test can be used) for the duration of the COVID-19 declaration under Section 564(b)(1) of the Act, 21 U.S.C. section  360bbb-3(b)(1), unless the authorization is terminated or revoked sooner.  Performed at [facility]  [facility], [address] [place], [place] [address]          Radiology Studies: CT Code Stroke CTA Head W/WO contrast  Result Date: 03/07/2020 CLINICAL DATA:  Left-sided weakness.  Facial droop. EXAM: CT ANGIOGRAPHY HEAD AND NECK TECHNIQUE: Multidetector CT imaging of the head and neck was performed using the standard protocol during bolus administration of intravenous contrast. Multiplanar CT image reconstructions and MIPs were obtained to evaluate the vascular anatomy. Carotid stenosis measurements (when applicable) are obtained utilizing NASCET criteria, using the distal internal carotid diameter as the denominator. CONTRAST:  76mL OMNIPAQUE IOHEXOL 350 MG/ML SOLN COMPARISON:  None. FINDINGS: CTA NECK FINDINGS Aortic arch: Standard 3 vessel aortic arch with mild atherosclerotic plaque. No evidence of a significant arch vessel origin stenosis although note that the brachiocephalic artery origin was incompletely imaged. Right carotid system: Patent with mild calcified plaque at the carotid bifurcation. No evidence of significant stenosis or dissection. Left carotid system: Patent with moderate calcified plaque about the carotid bifurcation. No evidence of significant stenosis or dissection. Vertebral arteries: Patent and codominant. Calcified plaque at the right vertebral artery origin without evidence of significant stenosis. Skeleton: Focally advanced disc degeneration at C6-7. Other neck: Mild asymmetric enlargement and increased density of the left parotid gland without a discrete mass or surrounding inflammatory change. No evidence of cervical lymphadenopathy. Upper chest: Biapical emphysema, asymmetrically advanced on the right. Review of the MIP images confirms the above findings CTA HEAD FINDINGS Anterior circulation: The internal carotid arteries are patent from skull base to carotid termini  with mild atherosclerotic plaque bilaterally not resulting in significant stenosis. ACAs and MCAs are patent with mild branch vessel irregularity but no evidence of a proximal branch occlusion or significant proximal stenosis. A small fenestration is questioned in the left M1 segment, an anatomic variant. No aneurysm is identified. Posterior circulation: The intracranial vertebral arteries are widely patent to the basilar. Patent PICA and SCA origins are seen bilaterally. The basilar artery is widely patent. Posterior communicating arteries are not identified and may be diminutive or absent. Both PCAs are patent without evidence of a significant proximal stenosis. No aneurysm is identified. Venous sinuses: Patent with the left transverse and sigmoid sinuses being hypoplastic. Anatomic variants: None of significance. Review of the MIP images confirms the above findings IMPRESSION: 1. No large vessel occlusion. 2. Mild atherosclerosis in the head and neck without a significant stenosis. 3. Aortic Atherosclerosis (ICD10-I70.0) and Emphysema (ICD10-J43.9). These results were communicated to Dr. Amada Jupiter at 10:25 pm on 03/07/2020 by text page via the [facility] messaging system. Electronically Signed   By: Sebastian Ache M.D.   On: 03/07/2020 22:36   CT Code Stroke CTA Neck W/WO contrast  Result Date: 03/07/2020 CLINICAL DATA:  Left-sided weakness.  Facial droop. EXAM: CT ANGIOGRAPHY HEAD AND NECK TECHNIQUE: Multidetector CT imaging of the head and neck was performed using the standard protocol during bolus administration of intravenous contrast. Multiplanar CT image reconstructions and MIPs were obtained to evaluate the vascular anatomy. Carotid stenosis measurements (when applicable) are obtained utilizing NASCET criteria, using the distal internal carotid diameter as the denominator. CONTRAST:  42mL OMNIPAQUE IOHEXOL 350 MG/ML SOLN COMPARISON:  None. FINDINGS: CTA NECK FINDINGS Aortic arch: Standard 3 vessel aortic arch  with mild atherosclerotic plaque. No evidence of a significant arch vessel origin stenosis although note that the brachiocephalic artery origin was incompletely imaged. Right carotid system: Patent with mild calcified plaque at the carotid bifurcation. No evidence of significant stenosis or  dissection. Left carotid system: Patent with moderate calcified plaque about the carotid bifurcation. No evidence of significant stenosis or dissection. Vertebral arteries: Patent and codominant. Calcified plaque at the right vertebral artery origin without evidence of significant stenosis. Skeleton: Focally advanced disc degeneration at C6-7. Other neck: Mild asymmetric enlargement and increased density of the left parotid gland without a discrete mass or surrounding inflammatory change. No evidence of cervical lymphadenopathy. Upper chest: Biapical emphysema, asymmetrically advanced on the right. Review of the MIP images confirms the above findings CTA HEAD FINDINGS Anterior circulation: The internal carotid arteries are patent from skull base to carotid termini with mild atherosclerotic plaque bilaterally not resulting in significant stenosis. ACAs and MCAs are patent with mild branch vessel irregularity but no evidence of a proximal branch occlusion or significant proximal stenosis. A small fenestration is questioned in the left M1 segment, an anatomic variant. No aneurysm is identified. Posterior circulation: The intracranial vertebral arteries are widely patent to the basilar. Patent PICA and SCA origins are seen bilaterally. The basilar artery is widely patent. Posterior communicating arteries are not identified and may be diminutive or absent. Both PCAs are patent without evidence of a significant proximal stenosis. No aneurysm is identified. Venous sinuses: Patent with the left transverse and sigmoid sinuses being hypoplastic. Anatomic variants: None of significance. Review of the MIP images confirms the above findings  IMPRESSION: 1. No large vessel occlusion. 2. Mild atherosclerosis in the head and neck without a significant stenosis. 3. Aortic Atherosclerosis (ICD10-I70.0) and Emphysema (ICD10-J43.9). These results were communicated to Dr. Amada Jupiter at 10:25 pm on 03/07/2020 by text page via the [facility] messaging system. Electronically Signed   By: Sebastian Ache M.D.   On: 03/07/2020 22:36   MR BRAIN WO CONTRAST  Result Date: 03/08/2020 CLINICAL DATA:  Transient ischemic attack EXAM: MRI HEAD WITHOUT CONTRAST TECHNIQUE: Multiplanar, multiecho pulse sequences of the brain and surrounding structures were obtained without intravenous contrast. COMPARISON:  None. FINDINGS: Brain: No acute infarct, acute hemorrhage or extra-axial collection. Multifocal hyperintense T2-weighted signal within the white matter. There is generalized atrophy without lobar predilection. No chronic microhemorrhage. Normal midline structures. Vascular: Normal flow voids. Skull and upper cervical spine: Normal marrow signal. Sinuses/Orbits: Negative. Postsurgical changes of the maxillary sinuses and nasal septum. Other: None. IMPRESSION: 1. No acute intracranial abnormality. 2. Generalized atrophy and findings of chronic microvascular disease. Electronically Signed   By: Deatra Robinson M.D.   On: 03/08/2020 03:06   EEG adult  Result Date: 03/08/2020 Charlsie Quest, MD     03/08/2020  6:23 PM Patient Name: Beverly Lawrence MRN: [id number] Epilepsy Attending: Charlsie Quest Referring Physician/Provider: Dr Marvel Plan Date: 03/08/2020 Duration: 25.03 mins Patient history: 75yo F with transient left sided weakness and numbness. EEG to evaluate for seizure. Level of alertness: Awake, asleep AEDs during EEG study: None Technical aspects: This EEG study was done with scalp electrodes positioned according to the 10-20 International system of electrode placement. Electrical activity was acquired at a sampling rate of 500Hz  and reviewed with a high frequency filter of  70Hz  and a low frequency filter of 1Hz . EEG data were recorded continuously and digitally stored. Description: The posterior dominant rhythm consists of 8 Hz activity of moderate voltage (25-35 uV) seen predominantly in posterior head regions, symmetric and reactive to eye opening and eye closing. Sleep was characterized by vertex waves, sleep spindles (12 to 14 Hz), maximal frontocentral region. Hyperventilation and photic stimulation were not performed.   IMPRESSION: This study is  within normal limits. No seizures or epileptiform discharges were seen throughout the recording. Charlsie Quest   ECHOCARDIOGRAM COMPLETE  Result Date: 03/08/2020    ECHOCARDIOGRAM REPORT   Patient Name:   Beverly Lawrence Date of Exam: 03/08/2020 Medical Rec #:  [id number]     Height:       63.0 in Accession #:    [id number]    Weight:       126.8 lb Date of Birth:  1945/11/21     BSA:          1.593 m Patient Age:    74 years      BP:           97/86 mmHg Patient Gender: F             HR:           73 bpm. Exam Location:  Inpatient Procedure: 2D Echo Indications:    TIA 435.9  History:        Patient has no prior history of Echocardiogram examinations.                 Arrythmias:Paroxymal afib; Risk Factors:Hypertension.  Sonographer:    Delcie Roch Referring Phys: [id number] JINDONG XU IMPRESSIONS  1. Left ventricular ejection fraction, by estimation, is 60 to 65%. The left ventricle has normal function. The left ventricle has no regional wall motion abnormalities. There is mild left ventricular hypertrophy. Left ventricular diastolic parameters are consistent with Grade I diastolic dysfunction (impaired relaxation).  2. Right ventricular systolic function is moderately reduced. The right ventricular size is mildly enlarged. There is mildly elevated pulmonary artery systolic pressure. The estimated right ventricular systolic pressure is 40.9 mmHg.  3. Left atrial size was mildly dilated.  4. The mitral valve is normal in structure.  No evidence of mitral valve regurgitation.  5. The aortic valve is tricuspid. Aortic valve regurgitation is mild. No aortic stenosis is present.  6. The inferior vena cava is normal in size with greater than 50% respiratory variability, suggesting right atrial pressure of 3 mmHg. FINDINGS  Left Ventricle: Left ventricular ejection fraction, by estimation, is 60 to 65%. The left ventricle has normal function. The left ventricle has no regional wall motion abnormalities. The left ventricular internal cavity size was normal in size. There is  mild left ventricular hypertrophy. Left ventricular diastolic parameters are consistent with Grade I diastolic dysfunction (impaired relaxation). Right Ventricle: The right ventricular size is mildly enlarged. Right vetricular wall thickness was not assessed. Right ventricular systolic function is moderately reduced. There is mildly elevated pulmonary artery systolic pressure. The tricuspid regurgitant velocity is 3.08 m/s, and with an assumed right atrial pressure of 3 mmHg, the estimated right ventricular systolic pressure is 40.9 mmHg. Left Atrium: Left atrial size was mildly dilated. Right Atrium: Right atrial size was normal in size. Pericardium: There is no evidence of pericardial effusion. Mitral Valve: The mitral valve is normal in structure. No evidence of mitral valve regurgitation. Tricuspid Valve: The tricuspid valve is normal in structure. Tricuspid valve regurgitation is mild. Aortic Valve: The aortic valve is tricuspid. Aortic valve regurgitation is mild. Aortic regurgitation PHT measures 535 msec. No aortic stenosis is present. Aortic valve mean gradient measures 8.0 mmHg. Aortic valve peak gradient measures 17.8 mmHg. Aortic valve area, by VTI measures 2.00 cm. Pulmonic Valve: The pulmonic valve was not well visualized. Pulmonic valve regurgitation is not visualized. Aorta: The aortic root and ascending aorta are structurally normal, with no  evidence of  dilitation. Venous: The inferior vena cava is normal in size with greater than 50% respiratory variability, suggesting right atrial pressure of 3 mmHg. IAS/Shunts: The interatrial septum was not well visualized.  LEFT VENTRICLE PLAX 2D LVIDd:         4.30 cm  Diastology LVIDs:         2.40 cm  LV e' lateral:   6.53 cm/s LV PW:         1.10 cm  LV E/e' lateral: 8.5 LV IVS:        0.90 cm  LV e' medial:    6.64 cm/s LVOT diam:     2.20 cm  LV E/e' medial:  8.4 LV SV:         70 LV SV Index:   44 LVOT Area:     3.80 cm  RIGHT VENTRICLE             IVC RV S prime:     20.50 cm/s  IVC diam: 1.30 cm LEFT ATRIUM             Index       RIGHT ATRIUM           Index LA diam:        4.50 cm 2.82 cm/m  RA Area:     14.70 cm LA Vol (A2C):   67.9 ml 42.62 ml/m RA Volume:   29.10 ml  18.27 ml/m LA Vol (A4C):   49.6 ml 31.14 ml/m LA Biplane Vol: 60.0 ml 37.67 ml/m  AORTIC VALVE AV Area (Vmax):    1.70 cm AV Area (Vmean):   1.67 cm AV Area (VTI):     2.00 cm AV Vmax:           211.00 cm/s AV Vmean:          125.000 cm/s AV VTI:            0.347 m AV Peak Grad:      17.8 mmHg AV Mean Grad:      8.0 mmHg LVOT Vmax:         94.60 cm/s LVOT Vmean:        54.800 cm/s LVOT VTI:          0.183 m LVOT/AV VTI ratio: 0.53 AI PHT:            535 msec  AORTA Ao Root diam: 3.30 cm Ao Asc diam:  3.00 cm MITRAL VALVE               TRICUSPID VALVE MV Area (PHT): 2.62 cm    TR Peak grad:   37.9 mmHg MV Decel Time: 289 msec    TR Vmax:        308.00 cm/s MV E velocity: 55.70 cm/s MV A velocity: 74.60 cm/s  SHUNTS MV E/A ratio:  0.75        Systemic VTI:  0.18 m                            Systemic Diam: 2.20 cm Epifanio Lesches MD Electronically signed by Epifanio Lesches MD Signature Date/Time: 03/08/2020/2:06:31 PM    Final    CT HEAD CODE STROKE WO CONTRAST  Result Date: 03/07/2020 CLINICAL DATA:  Code stroke.  Left-sided weakness.  Facial droop. EXAM: CT HEAD WITHOUT CONTRAST TECHNIQUE: Contiguous axial images were obtained  from the base of the skull through the vertex without intravenous  contrast. COMPARISON:  Head CT 01/11/2018 and MRI 08/20/2014 FINDINGS: Brain: There is no evidence of an acute infarct, intracranial hemorrhage, mass, midline shift, or extra-axial fluid collection. Mild cerebral atrophy is within normal limits for age. Hypodensities in the cerebral white matter bilaterally are nonspecific but compatible with mild chronic small vessel ischemic disease, stable to slightly progressed. 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 Vascular: Calcified atherosclerosis at the skull base. No hyperdense vessel. Skull: No fracture or suspicious osseous lesion. Sinuses/Orbits: Prior endoscopic sinus surgery. Extensive chronic sinusitis with circumferential mucosal thickening in the maxillary sinuses, near complete opacification of the sphenoid sinuses, complete opacification of the right frontal sinus, and moderate bilateral ethmoid sinus mucosal thickening. 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 Small chronic effusion at the left mastoid tip. Bilateral cataract extraction. Other: None. ASPECTS [facility] Stroke Program Early CT Score) - Ganglionic level infarction (caudate, lentiform nuclei, internal capsule, insula, M1-M3 cortex): 7 - Supraganglionic infarction (M4-M6 cortex): 3 Total score (0-10 with 10 being normal): 10 IMPRESSION: 1. No evidence of acute intracranial abnormality. 2. ASPECTS is 10. 3. Mild chronic small vessel ischemic disease. These results were communicated to Dr. Amada Jupiter at 10:05 pm on 03/07/2020 by text page via the [facility] messaging system. Electronically Signed   By: Sebastian Ache M.D.   On: 03/07/2020 22:06        Scheduled Meds: . clopidogrel  75 mg Oral Daily  . diltiazem  180 mg Oral Daily  . folic acid  1 mg Oral BID  . oxybutynin  5 mg Oral QHS  . pantoprazole  40 mg Oral BID  . sodium chloride flush  3 mL Intravenous Q12H   Continuous  Infusions: . sodium chloride    . dextrose 5 % and 0.9% NaCl Stopped (03/08/20 2023)  . vancomycin 1,000 mg (03/09/20 1830)     LOS: 0 days    Time spent: 35 minutes    Berton Mount, MD  [facility] Pager #: [phone] 7PM-7AM contact night coverage as above

## 2020-03-09 NOTE — Progress Notes (Signed)
Mobility Specialist - Progress Note   03/09/20 1223  Mobility  Activity Ambulated in room  Level of Assistance Standby assist, set-up cues, supervision of patient - no hands on  Assistive Device Front wheel walker  Distance Ambulated (ft) 15 ft  Mobility Response Tolerated well  Mobility performed by Mobility specialist  $Mobility charge 1 Mobility    Pre-mobility: 66 HR, 100% SpO2 Post-mobility: 76 HR, 97% SpO2  Pt c/o feeling weak while ambulating. She says she feels like she'd do better with a rollator and shoes.   Mamie Levers Mobility Specialist Mobility Specialist Phone: [phone]

## 2020-03-09 NOTE — Evaluation (Signed)
Physical Therapy Evaluation Patient Details Name: Beverly Lawrence MRN: [id number] DOB: Oct 12, 1945 Today's Date: 03/09/2020   History of Present Illness  74 yo admitted with transient Left weakness with negative MRI and normal EEG with hyponatremia. PMHx: PAF, HTN, SIADH, GIB, diarrhea  Clinical Impression  Pt supine reporting anxiety over moving as she is unsure how much she can do. Pt reports for the last week she has barely been able to move and hasn't gotten out of her recliner much and hardly got to the bathroom or ate. Pt lives at home with spouse who can provide supervision but not physical assist and daughter lives nearby. Pt able to perform limited in room mobility due to fatigue. Pt with decreased functional mobility, pulmonary function, and transfers who will benefit from acute therapy to maximize mobility, safety and function. Pt educated for need to be up and to the chair for meals, toileting in the bathroom not being dependent on purewick and to continue HEP.   SpO2 dropped to 85% on RA at rest and maintained 2L throughout session at 95%     Follow Up Recommendations Home health PT;Supervision/Assistance - 24 hour (initial assist of daughter during the day)    Equipment Recommendations  None recommended by PT    Recommendations for Other Services       Precautions / Restrictions Precautions Precautions: Fall Precaution Comments: watch sats      Mobility  Bed Mobility Overal bed mobility: Modified Independent             General bed mobility comments: increased time with rail  Transfers Overall transfer level: Needs assistance   Transfers: Sit to/from Stand Sit to Stand: Min guard         General transfer comment: cues for hand placement, increased time  Ambulation/Gait Ambulation/Gait assistance: Min guard Gait Distance (Feet): 35 Feet Assistive device: Rolling walker (2 wheeled) Gait Pattern/deviations: Step-through pattern;Decreased stride  length;Trunk flexed   Gait velocity interpretation: 1.31 - 2.62 ft/sec, indicative of limited community ambulator General Gait Details: cues for safety with assist for lines and O2, maintained 95% on 2L with gait  Stairs            Wheelchair Mobility    Modified Rankin (Stroke Patients Only)       Balance Overall balance assessment: History of Falls;Needs assistance   Sitting balance-Leahy Scale: Good     Standing balance support: Bilateral upper extremity supported Standing balance-Leahy Scale: Poor Standing balance comment: RW use for gait                             Pertinent Vitals/Pain Pain Assessment: 0-10 Pain Score: 4  Pain Location: shoulder and upper arm LUE Pain Descriptors / Indicators: Aching Pain Intervention(s): Limited activity within patient's tolerance;Monitored during session;Repositioned    Home Living Family/patient expects to be discharged to:: Private residence Living Arrangements: Spouse/significant other Available Help at Discharge: Family;Available 24 hours/day Type of Home: House       Home Layout: One level Home Equipment: Walker - 2 wheels;Walker - 4 wheels;Other (comment);Bedside commode;Shower seat;Wheelchair - manual (adjustable bed, lift chair)      Prior Function Level of Independence: Needs assistance   Gait / Transfers Assistance Needed: walks with rollator  ADL's / Homemaking Assistance Needed: spouse cooks, daughter does the cleaning and laundry        Hand Dominance        Extremity/Trunk Assessment   Upper  Extremity Assessment Upper Extremity Assessment: Generalized weakness    Lower Extremity Assessment Lower Extremity Assessment: Generalized weakness (3/5 bil hip flexion and dorsiflexion, 4/5 knee flexion and extension)    Cervical / Trunk Assessment Cervical / Trunk Assessment: Kyphotic  Communication   Communication: HOH  Cognition Arousal/Alertness: Awake/alert Behavior During  Therapy: WFL for tasks assessed/performed Overall Cognitive Status: Within Functional Limits for tasks assessed                                        General Comments      Exercises General Exercises - Lower Extremity Long Arc Quad: AROM;Both;Seated;10 reps Hip Flexion/Marching: AROM;Both;Seated;10 reps   Assessment/Plan    PT Assessment Patient needs continued PT services  PT Problem List Decreased strength;Decreased mobility;Decreased activity tolerance;Decreased balance;Decreased knowledge of use of DME;Cardiopulmonary status limiting activity       PT Treatment Interventions Gait training;Functional mobility training;Therapeutic activities;Patient/family education;DME instruction;Therapeutic exercise;Balance training    PT Goals (Current goals can be found in the Care Plan section)  Acute Rehab PT Goals Patient Stated Goal: return home PT Goal Formulation: With patient Time For Goal Achievement: 03/23/20 Potential to Achieve Goals: Fair    Frequency Min 3X/week   Barriers to discharge Decreased caregiver support      Co-evaluation               AM-PAC PT "6 Clicks" Mobility  Outcome Measure Help needed turning from your back to your side while in a flat bed without using bedrails?: A Little Help needed moving from lying on your back to sitting on the side of a flat bed without using bedrails?: A Little Help needed moving to and from a bed to a chair (including a wheelchair)?: A Little Help needed standing up from a chair using your arms (e.g., wheelchair or bedside chair)?: A Little Help needed to walk in hospital room?: A Little Help needed climbing 3-5 steps with a railing? : A Lot 6 Click Score: 17    End of Session Equipment Utilized During Treatment: Gait belt;Oxygen Activity Tolerance: Patient tolerated treatment well Patient left: in chair;with call bell/phone within reach;with chair alarm set Nurse Communication: Mobility  status PT Visit Diagnosis: Other abnormalities of gait and mobility (R26.89);Muscle weakness (generalized) (M62.81)    Time: 6295-2841 PT Time Calculation (min) (ACUTE ONLY): 34 min   Charges:   PT Evaluation $PT Eval Moderate Complexity: 1 Mod PT Treatments $Therapeutic Activity: 8-22 mins        Lesleigh Hughson P, PT Acute Rehabilitation Services Pager: [phone] Office: [phone]   Jasmon Mattice B Mardy Hoppe 03/09/2020, 1:31 PM

## 2020-03-10 ENCOUNTER — Inpatient Hospital Stay (HOSPITAL_COMMUNITY): Payer: Medicare Other

## 2020-03-10 DIAGNOSIS — I10 Essential (primary) hypertension: Secondary | ICD-10-CM

## 2020-03-10 DIAGNOSIS — R1915 Other abnormal bowel sounds: Secondary | ICD-10-CM

## 2020-03-10 LAB — BASIC METABOLIC PANEL
Anion gap: 6 (ref 5–15)
Anion gap: 8 (ref 5–15)
BUN: 9 mg/dL (ref 8–23)
BUN: 10 mg/dL (ref 8–23)
CO2: 26 mmol/L (ref 22–32)
CO2: 25 mmol/L (ref 22–32)
Calcium: 8.1 mg/dL — ABNORMAL LOW (ref 8.9–10.3)
Calcium: 8.3 mg/dL — ABNORMAL LOW (ref 8.9–10.3)
Chloride: 93 mmol/L — ABNORMAL LOW (ref 98–111)
Chloride: 92 mmol/L — ABNORMAL LOW (ref 98–111)
Creatinine, Ser: 0.59 mg/dL (ref 0.44–1.00)
Creatinine, Ser: 0.5 mg/dL (ref 0.44–1.00)
GFR calc Af Amer: >60 mL/min (ref >60)
GFR calc Af Amer: >60 mL/min (ref >60)
GFR calc non Af Amer: >60 mL/min (ref >60)
GFR calc non Af Amer: >60 mL/min (ref >60)
Glucose, Bld: 107 mg/dL — ABNORMAL HIGH (ref 70–99)
Glucose, Bld: 99 mg/dL (ref 70–99)
Potassium: 4.1 mmol/L (ref 3.5–5.1)
Potassium: 4.1 mmol/L (ref 3.5–5.1)
Sodium: 125 mmol/L — ABNORMAL LOW (ref 135–145)
Sodium: 125 mmol/L — ABNORMAL LOW (ref 135–145)

## 2020-03-10 LAB — CBC
HCT: 23.5 % — ABNORMAL LOW (ref 36.0–46.0)
Hemoglobin: 7.8 g/dL — ABNORMAL LOW (ref 12.0–15.0)
MCH: 39 pg — ABNORMAL HIGH (ref 26.0–34.0)
MCHC: 33.2 g/dL (ref 30.0–36.0)
MCV: 117.5 fL — ABNORMAL HIGH (ref 80.0–100.0)
Platelets: 119 10*3/uL — ABNORMAL LOW (ref 150–400)
RBC: 2 MIL/uL — ABNORMAL LOW (ref 3.87–5.11)
RDW: 14.4 % (ref 11.5–15.5)
WBC: 2.4 10*3/uL — ABNORMAL LOW (ref 4.0–10.5)
nRBC: 0 % (ref 0.0–0.2)

## 2020-03-10 LAB — URINALYSIS, ROUTINE W REFLEX MICROSCOPIC
Bilirubin Urine: NEGATIVE
Glucose, UA: NEGATIVE mg/dL
Hgb urine dipstick: NEGATIVE
Ketones, ur: NEGATIVE mg/dL
Leukocytes,Ua: NEGATIVE
Nitrite: NEGATIVE
Protein, ur: NEGATIVE mg/dL
Specific Gravity, Urine: 1.017 (ref 1.005–1.030)
pH: 6 (ref 5.0–8.0)

## 2020-03-10 MED ORDER — SODIUM CHLORIDE 0.9 % IV SOLN: INTRAVENOUS | Status: AC

## 2020-03-10 MED ORDER — IOHEXOL 9 MG/ML PO SOLN: 500.0000 mL | ORAL | Status: AC

## 2020-03-10 NOTE — Progress Notes (Addendum)
Patient continues with watery brown stools today,small amounts at a time.  UA and culture sent as ordered. Did have to obtain UA via In and out catheterization. Urine out put measured with some incontinent small amounts with BM. Will continue to monitor.  Baraa Tubbs, Randall An RN

## 2020-03-10 NOTE — Progress Notes (Signed)
Patient with complaints of nausea, zofran given as ordered as needed for nausea. Will monitor patient. Zaim Nitta, Randall An RN

## 2020-03-10 NOTE — Progress Notes (Addendum)
PROGRESS NOTE    Beverly Lawrence  ZOX:[id number] DOB: [birth year] DOA: 03/07/2020 PCP: Malka So., MD   Brief Narrative: Beverly Lawrence is a 74 y.o. female with past medical history significant for PAF, HTN, chronic loose stools at 2-3 per day, SIADH, hemolytic anemia, h/o GI bleed 2 years ago. Patient presented secondary to left sided weakness and admitted for concern for TIA. Patient found to have hypoglycemia in addition to hyponatremia. TIA workup initiated in addition to treatment for hyponatremia (likely hypovolemic on admission) and hypoglycemia.   Assessment & Plan:   Active Problems:   Hypertension   Autoimmune hemolytic anemia   TIA (transient ischemic attack)   Hypoglycemia without diagnosis of diabetes mellitus   (HFpEF) heart failure with preserved ejection fraction (HCC)   Hyponatremia   Hyponatremia Unsure of etiology; patient has a history of SIADH but also has recent history is of hyponatremia secondary to volume overload requiring Lasix. Patient has been treated as hypovolemic hyponatremia with IV fluids. It was an initial improvement with a peak of 127 with some decrease sodium overnight. Patient was given normal saline infusion overnight which has stopped this morning. Possibility that this is related to adrenal insufficiency however that diagnosis is being worked up.  Overall, patient looks euvolemic at this time. -Repeat BMP this afternoon and if improved will continue IV fluids  Left sided weakness No stroke seen on CT head or MRI.  Neurology consulted for evaluation.  Possible TIA versus sequela from hypoglycemia or hyponatremia.  Neurology recommending to continue Plavix.  With patient's history of atrial fibrillation, patient would benefit from anticoagulation however this was previously discontinued secondary to history of autoimmune hemolytic disease in addition to GI bleeding.  Hypoglycemia Unknown etiology.  Patient does not take any hypoglycemic agents  as an outpatient.  Currently evaluating for possible with hypoglycemia.  Patient is not hypoglycemic here in the hospital since admission.  Hemoglobin A1c obtained and is undetectable at less than 3.5%.  Abnormal ACTH stimulation test It does not appear this was performed correctly initially as there was a delay between administration of cosyntropin and measurements of cortisol -Repeat ACTH stimulation test in addition to adding ACTH in morning labs  Pancytopenia Associated lymphocytopenia. Platelets trending down. Afebrile. Unsure of etiology at this time but possibly related to underlying infection if UTI present.  Patient does have a history of autoimmune hemolytic anemia and hemoglobin is currently stable.  Chronic diastolic heart failure Recent echocardiogram from 9/4 significant for an EF of 60 to 65% with grade 1 diastolic dysfunction.  Currently not in heart failure.  Currently not fluid overloaded.  Abdominal pain Associated hyperactive bowel sounds.  Abdominal x-ray was obtained and was significant for possible ileus versus early bowel obstruction.  Patient does not have associated vomiting but was nauseated earlier.  Patient has a history of multiple abdominal surgeries in addition to previous bowel obstruction requiring surgical management. -CT abdomen pelvis  Dysuria Previous urine culture with multiple species.  Patient's urine looks tea colored in canister.  Most recent urinalysis not very remarkable except for small amounts of glucose in addition to small amount of ketones. -Repeat urinalysis with urine culture  Tea colored urine Unsure of etiology.  Last urinalysis was not very remarkable -Repeat urinalysis as mentioned above   DVT prophylaxis: SCDs Code Status:   Code Status: Full Code Family Communication: None at bedside Disposition Plan: Discharge home with home health PT per PT recommendations pending improvement of hyponatremia in addition to  work-up for abdominal  distention, dysuria   Consultants:   Neurology  Procedures:   TRANSTHORACIC ECHOCARDIOGRAM (03/08/2020) IMPRESSIONS    1. Left ventricular ejection fraction, by estimation, is 60 to 65%. The  left ventricle has normal function. The left ventricle has no regional  wall motion abnormalities. There is mild left ventricular hypertrophy.  Left ventricular diastolic parameters  are consistent with Grade I diastolic dysfunction (impaired relaxation).  2. Right ventricular systolic function is moderately reduced. The right  ventricular size is mildly enlarged. There is mildly elevated pulmonary  artery systolic pressure. The estimated right ventricular systolic  pressure is 40.9 mmHg.  3. Left atrial size was mildly dilated.  4. The mitral valve is normal in structure. No evidence of mitral valve  regurgitation.  5. The aortic valve is tricuspid. Aortic valve regurgitation is mild. No  aortic stenosis is present.  6. The inferior vena cava is normal in size with greater than 50%  respiratory variability, suggesting right atrial pressure of 3 mmHg.   Antimicrobials:  Vancomycin   Subjective: Bowel movement overnight that was watery. Patient reports no flatus and some abdominal tenderness. She reports a history of SBO with need for surgical management. No nausea/vomiting.   Objective: Vitals:   03/10/20 0032 03/10/20 0421 03/10/20 0622 03/10/20 0847  BP: 109/61 112/61  132/75  Pulse: 69 72  81  Resp: 13 16  16   Temp: 98.8 F (37.1 C) 99 F (37.2 C)  99 F (37.2 C)  TempSrc: Oral Oral  Oral  SpO2: 96% 99%  97%  Weight:   56.3 kg   Height:        Intake/Output Summary (Last 24 hours) at 03/10/2020 1109 Last data filed at 03/10/2020 1025 Gross per 24 hour  Intake 740 ml  Output 260 ml  Net 480 ml   Filed Weights   03/07/20 2100 03/10/20 0622  Weight: 57.5 kg 56.3 kg    Examination:  General exam: Appears calm and comfortable Respiratory system: Clear to  auscultation. Respiratory effort normal. Cardiovascular system: S1 & S2 heard, RRR. No murmurs, rubs, gallops or clicks. Gastrointestinal system: Abdomen is nondistended, soft and nontender. No organomegaly or masses felt. Hyperactive bowel sounds heard. Central nervous system: Alert and oriented. No focal neurological deficits. Musculoskeletal: No edema. No calf tenderness Skin: No cyanosis. No rashes Psychiatry: Judgement and insight appear normal. Mood & affect appropriate.     Data Reviewed: I have personally reviewed following labs and imaging studies  CBC Lab Results  Component Value Date   WBC 2.4 (L) 03/10/2020   RBC 2.00 (L) 03/10/2020   HGB 7.8 (L) 03/10/2020   HCT 23.5 (L) 03/10/2020   MCV 117.5 (H) 03/10/2020   MCH 39.0 (H) 03/10/2020   PLT 119 (L) 03/10/2020   MCHC 33.2 03/10/2020   RDW 14.4 03/10/2020   LYMPHSABS 0.6 (L) 03/07/2020   MONOABS 0.2 03/07/2020   EOSABS 0.0 03/07/2020   BASOSABS 0.0 03/07/2020     Last metabolic panel Lab Results  Component Value Date   NA 125 (L) 03/10/2020   K 4.1 03/10/2020   CL 93 (L) 03/10/2020   CO2 26 03/10/2020   BUN 9 03/10/2020   CREATININE 0.59 03/10/2020   GLUCOSE 107 (H) 03/10/2020   GFRNONAA >60 03/10/2020   GFRAA >60 03/10/2020   CALCIUM 8.1 (L) 03/10/2020   PHOS 4.0 05/16/2015   PROT 6.2 (L) 03/07/2020   ALBUMIN 3.3 (L) 03/07/2020   BILITOT 2.4 (H) 03/07/2020   ALKPHOS  88 03/07/2020   AST 41 03/07/2020   ALT 17 03/07/2020   ANIONGAP 6 03/10/2020    CBG (last 3)  Recent Labs    2020/04/04 0153 04-Apr-2020 0545 04-04-20 1829  GLUCAP 88 76 124*     GFR: Estimated Creatinine Clearance: 50.3 mL/min (by C-G formula based on SCr of 0.59 mg/dL).  Coagulation Profile: Recent Labs  Lab 03/07/20 2144  INR 1.2    Recent Results (from the past 240 hour(s))  Urine culture     Status: Abnormal   Collection Time: 04-Apr-2020 12:45 AM   Specimen: Urine, Random  Result Value Ref Range Status   Specimen  Description URINE, RANDOM  Final   Special Requests   Final    NONE Performed at [facility], [address] [place], [place] [address]    Culture (A)  Final    60,000 COLONIES/mL MULTIPLE SPECIES PRESENT, SUGGEST RECOLLECTION   Report Status 03/09/2020 FINAL  Final  SARS Coronavirus 2 by RT PCR (hospital order, performed in [facility] hospital lab) Nasopharyngeal Nasopharyngeal Swab     Status: None   Collection Time: 2020-04-04  1:00 AM   Specimen: Nasopharyngeal Swab  Result Value Ref Range Status   SARS Coronavirus 2 NEGATIVE NEGATIVE Final    Comment: (NOTE) SARS-CoV-2 target nucleic acids are NOT DETECTED.  The SARS-CoV-2 RNA is generally detectable in upper and lower respiratory specimens during the acute phase of infection. The lowest concentration of SARS-CoV-2 viral copies this assay can detect is 250 copies / mL. A negative result does not preclude SARS-CoV-2 infection and should not be used as the sole basis for treatment or other patient management decisions.  A negative result may occur with improper specimen collection / handling, submission of specimen other than nasopharyngeal swab, presence of viral mutation(s) within the areas targeted by this assay, and inadequate number of viral copies (<250 copies / mL). A negative result must be combined with clinical observations, patient history, and epidemiological information.  Fact Sheet for Patients:   BoilerBrush.com.cy  Fact Sheet for Healthcare Providers: https://pope.com/  This test is not yet approved or  cleared by the [place] FDA and has been authorized for detection and/or diagnosis of SARS-CoV-2 by FDA under an Emergency Use Authorization (EUA).  This EUA will remain in effect (meaning this test can be used) for the duration of the COVID-19 declaration under Section 564(b)(1) of the Act, 21 U.S.C. section 360bbb-3(b)(1), unless the authorization is  terminated or revoked sooner.  Performed at [facility], [address] [place], [place] [address]         Radiology Studies: EEG adult  Result Date: 04/04/20 Charlsie Quest, MD     Apr 04, 2020  6:23 PM Patient Name: Beverly Lawrence MRN: [id number] Epilepsy Attending: Charlsie Quest Referring Physician/Provider: Dr Marvel Plan Date: 2020/04/04 Duration: 25.03 mins Patient history: 74yo F with transient left sided weakness and numbness. EEG to evaluate for seizure. Level of alertness: Awake, asleep AEDs during EEG study: None Technical aspects: This EEG study was done with scalp electrodes positioned according to the 10-20 International system of electrode placement. Electrical activity was acquired at a sampling rate of 500Hz  and reviewed with a high frequency filter of 70Hz  and a low frequency filter of 1Hz . EEG data were recorded continuously and digitally stored. Description: The posterior dominant rhythm consists of 8 Hz activity of moderate voltage (25-35 uV) seen predominantly in posterior head regions, symmetric and reactive to eye opening  and eye closing. Sleep was characterized by vertex waves, sleep spindles (12 to 14 Hz), maximal frontocentral region. Hyperventilation and photic stimulation were not performed.   IMPRESSION: This study is within normal limits. No seizures or epileptiform discharges were seen throughout the recording. Charlsie Quest   ECHOCARDIOGRAM COMPLETE  Result Date: 03/08/2020    ECHOCARDIOGRAM REPORT   Patient Name:   Beverly Lawrence Date of Exam: 03/08/2020 Medical Rec #:  [id number]     Height:       63.0 in Accession #:    [id number]    Weight:       126.8 lb Date of Birth:  1946-02-12     BSA:          1.593 m Patient Age:    74 years      BP:           97/86 mmHg Patient Gender: F             HR:           73 bpm. Exam Location:  Inpatient Procedure: 2D Echo Indications:    TIA 435.9  History:        Patient has no prior history of Echocardiogram examinations.                  Arrythmias:Paroxymal afib; Risk Factors:Hypertension.  Sonographer:    Delcie Roch Referring Phys: [id number] JINDONG XU IMPRESSIONS  1. Left ventricular ejection fraction, by estimation, is 60 to 65%. The left ventricle has normal function. The left ventricle has no regional wall motion abnormalities. There is mild left ventricular hypertrophy. Left ventricular diastolic parameters are consistent with Grade I diastolic dysfunction (impaired relaxation).  2. Right ventricular systolic function is moderately reduced. The right ventricular size is mildly enlarged. There is mildly elevated pulmonary artery systolic pressure. The estimated right ventricular systolic pressure is 40.9 mmHg.  3. Left atrial size was mildly dilated.  4. The mitral valve is normal in structure. No evidence of mitral valve regurgitation.  5. The aortic valve is tricuspid. Aortic valve regurgitation is mild. No aortic stenosis is present.  6. The inferior vena cava is normal in size with greater than 50% respiratory variability, suggesting right atrial pressure of 3 mmHg. FINDINGS  Left Ventricle: Left ventricular ejection fraction, by estimation, is 60 to 65%. The left ventricle has normal function. The left ventricle has no regional wall motion abnormalities. The left ventricular internal cavity size was normal in size. There is  mild left ventricular hypertrophy. Left ventricular diastolic parameters are consistent with Grade I diastolic dysfunction (impaired relaxation). Right Ventricle: The right ventricular size is mildly enlarged. Right vetricular wall thickness was not assessed. Right ventricular systolic function is moderately reduced. There is mildly elevated pulmonary artery systolic pressure. The tricuspid regurgitant velocity is 3.08 m/s, and with an assumed right atrial pressure of 3 mmHg, the estimated right ventricular systolic pressure is 40.9 mmHg. Left Atrium: Left atrial size was mildly dilated. Right  Atrium: Right atrial size was normal in size. Pericardium: There is no evidence of pericardial effusion. Mitral Valve: The mitral valve is normal in structure. No evidence of mitral valve regurgitation. Tricuspid Valve: The tricuspid valve is normal in structure. Tricuspid valve regurgitation is mild. Aortic Valve: The aortic valve is tricuspid. Aortic valve regurgitation is mild. Aortic regurgitation PHT measures 535 msec. No aortic stenosis is present. Aortic valve mean gradient measures 8.0 mmHg. Aortic valve peak gradient measures 17.8 mmHg. Aortic valve area,  by VTI measures 2.00 cm. Pulmonic Valve: The pulmonic valve was not well visualized. Pulmonic valve regurgitation is not visualized. Aorta: The aortic root and ascending aorta are structurally normal, with no evidence of dilitation. Venous: The inferior vena cava is normal in size with greater than 50% respiratory variability, suggesting right atrial pressure of 3 mmHg. IAS/Shunts: The interatrial septum was not well visualized.  LEFT VENTRICLE PLAX 2D LVIDd:         4.30 cm  Diastology LVIDs:         2.40 cm  LV e' lateral:   6.53 cm/s LV PW:         1.10 cm  LV E/e' lateral: 8.5 LV IVS:        0.90 cm  LV e' medial:    6.64 cm/s LVOT diam:     2.20 cm  LV E/e' medial:  8.4 LV SV:         70 LV SV Index:   44 LVOT Area:     3.80 cm  RIGHT VENTRICLE             IVC RV S prime:     20.50 cm/s  IVC diam: 1.30 cm LEFT ATRIUM             Index       RIGHT ATRIUM           Index LA diam:        4.50 cm 2.82 cm/m  RA Area:     14.70 cm LA Vol (A2C):   67.9 ml 42.62 ml/m RA Volume:   29.10 ml  18.27 ml/m LA Vol (A4C):   49.6 ml 31.14 ml/m LA Biplane Vol: 60.0 ml 37.67 ml/m  AORTIC VALVE AV Area (Vmax):    1.70 cm AV Area (Vmean):   1.67 cm AV Area (VTI):     2.00 cm AV Vmax:           211.00 cm/s AV Vmean:          125.000 cm/s AV VTI:            0.347 m AV Peak Grad:      17.8 mmHg AV Mean Grad:      8.0 mmHg LVOT Vmax:         94.60 cm/s LVOT Vmean:         54.800 cm/s LVOT VTI:          0.183 m LVOT/AV VTI ratio: 0.53 AI PHT:            535 msec  AORTA Ao Root diam: 3.30 cm Ao Asc diam:  3.00 cm MITRAL VALVE               TRICUSPID VALVE MV Area (PHT): 2.62 cm    TR Peak grad:   37.9 mmHg MV Decel Time: 289 msec    TR Vmax:        308.00 cm/s MV E velocity: 55.70 cm/s MV A velocity: 74.60 cm/s  SHUNTS MV E/A ratio:  0.75        Systemic VTI:  0.18 m                            Systemic Diam: 2.20 cm Epifanio Lescheshristopher Schumann MD Electronically signed by Epifanio Lescheshristopher Schumann MD Signature Date/Time: 03/08/2020/2:06:31 PM    Final         Scheduled Meds: . clopidogrel  75 mg  Oral Daily  . diltiazem  180 mg Oral Daily  . folic acid  1 mg Oral BID  . oxybutynin  5 mg Oral QHS  . pantoprazole  40 mg Oral BID  . sodium chloride flush  3 mL Intravenous Q12H   Continuous Infusions: . sodium chloride    . dextrose 5 % and 0.9% NaCl Stopped (03/08/20 2023)  . vancomycin 1,000 mg (03/09/20 1830)     LOS: 1 day     Jacquelin Hawking, MD [facility] 03/10/2020, 11:09 AM  If 7PM-7AM, please contact night-coverage [email or web address]

## 2020-03-10 NOTE — Progress Notes (Signed)
STROKE TEAM PROGRESS NOTE   INTERVAL HISTORY No family at bedside. Pt reclining in bed, awake alert, stated that she had diarrhea this am again. She had on Friday before admission but none for the last two day but this am had it again. RLE cellulitis improving, on vanco. Urine culture pending, and cortisol test is pending.   OBJECTIVE Vitals:   03/10/20 0032 03/10/20 0421 03/10/20 0622 03/10/20 0847  BP: 109/61 112/61  132/75  Pulse: 69 72  81  Resp: 13 16  16   Temp: 98.8 F (37.1 C) 99 F (37.2 C)  99 F (37.2 C)  TempSrc: Oral Oral  Oral  SpO2: 96% 99%  97%  Weight:   56.3 kg   Height:        CBC:  Recent Labs  Lab 03/07/20 2144 03/07/20 2154 03/09/20 0702 03/10/20 0258  WBC 3.0*   < > 2.5* 2.4*  NEUTROABS 2.1  --   --   --   HGB 9.5*   < > 7.8* 7.8*  HCT 27.2*   < > 23.3* 23.5*  MCV 110.6*   < > 117.1* 117.5*  PLT 163   < > 122* 119*   < > = values in this interval not displayed.    Basic Metabolic Panel:  Recent Labs  Lab 03/09/20 0702 03/10/20 0258  NA 127* 125*  K 4.5 4.1  CL 93* 93*  CO2 30 26  GLUCOSE 117* 107*  BUN 7* 9  CREATININE 0.62 0.59  CALCIUM 8.0* 8.1*    Lipid Panel:     Component Value Date/Time   CHOL 155 03/08/2020 1409   TRIG 74 03/08/2020 1409   HDL 91 03/08/2020 1409   CHOLHDL 1.7 03/08/2020 1409   VLDL 15 03/08/2020 1409   LDLCALC 49 03/08/2020 1409   HgbA1c:  Lab Results  Component Value Date   HGBA1C <3.5 (L) 03/08/2020   Urine Drug Screen:     Component Value Date/Time   LABOPIA NONE DETECTED 03/08/2020 0056   COCAINSCRNUR NONE DETECTED 03/08/2020 0056   LABBENZ NONE DETECTED 03/08/2020 0056   AMPHETMU NONE DETECTED 03/08/2020 0056   THCU NONE DETECTED 03/08/2020 0056   LABBARB NONE DETECTED 03/08/2020 0056    Alcohol Level     Component Value Date/Time   ETH 57 (H) 03/07/2020 2144    IMAGING  CT Code Stroke CTA Head W/WO contrast CT Code Stroke CTA Neck W/WO contrast 03/07/2020 IMPRESSION:  1. No large  vessel occlusion.  2. Mild atherosclerosis in the head and neck without a significant stenosis.  3. Aortic Atherosclerosis (ICD10-I70.0) and Emphysema (ICD10-J43.9).   MR BRAIN WO CONTRAST 03/08/2020 IMPRESSION:  1. No acute intracranial abnormality.  2. Generalized atrophy and findings of chronic microvascular disease.   CT HEAD CODE STROKE WO CONTRAST 03/07/2020 IMPRESSION:  1. No evidence of acute intracranial abnormality.  2. ASPECTS is 10.  3. Mild chronic small vessel ischemic disease.   EEG 03/08/20 IMPRESSION: This study is within normal limits. No seizures or epileptiform discharges were seen throughout the recording.    PHYSICAL EXAM   Temp:  [98.4 F (36.9 C)-99 F (37.2 C)] 99 F (37.2 C) (09/06 0847) Pulse Rate:  [67-81] 81 (09/06 0847) Resp:  [13-18] 16 (09/06 0847) BP: (104-132)/(52-75) 132/75 (09/06 0847) SpO2:  [96 %-100 %] 97 % (09/06 0847) Weight:  [56.3 kg] 56.3 kg (09/06 0622)  General - Well nourished, well developed, in no apparent distress.  Ophthalmologic - fundi not visualized due to  noncooperation.  Cardiovascular - Regular rhythm and rate.  Mental Status -  Level of arousal and orientation to time, place, and person were intact. Language including expression, naming, repetition, comprehension was assessed and found intact. Fund of Knowledge was assessed and was intact.  Cranial Nerves II - XII - II - Visual field intact OU. III, IV, VI - Extraocular movements intact. V - Facial sensation intact bilaterally. VII - Facial movement intact bilaterally. VIII - Hearing & vestibular intact bilaterally. X - Palate elevates symmetrically. XI - Chin turning & shoulder shrug intact bilaterally. XII - Tongue protrusion intact.  Motor Strength - The patient's strength was normal in all extremities and pronator drift was absent except right knee flexion 4 -/5 due to arthritis.  Bulk was normal and fasciculations were absent.   Motor Tone - Muscle tone  was assessed at the neck and appendages and was normal.  Reflexes - The patient's reflexes were symmetrical in all extremities and she had no pathological reflexes.  Sensory - Light touch, temperature/pinprick were assessed and were symmetrical.    Coordination - The patient had normal movements in the hands with no ataxia or dysmetria.  Tremor was absent.  Gait and Station - deferred.    ASSESSMENT/PLAN Ms. VERNIS CABACUNGAN is a 74 y.o. female with history of DVT s/p IVC filter, HTN, chronic hyponatremia, CHF, autoimmune hemolytic anemia, TIA's, atrial fibrillation status post cardioversion (not on anticoagulation due to severe anemia), upper GI bleeding 2019 who presents with left-sided weakness and numbness. She did not receive IV t-PA due to mild deficits.  Transient episode of left sided deficit - hypoglycemia vs. hyponatremia vs. TIA given PAF not on anticoagulation  Code Stroke CT Head - No evidence of acute intracranial abnormality. ASPECTS is 10. Mild chronic small vessel ischemic disease.   MRI head - No acute intracranial abnormality. Generalized atrophy and findings of chronic microvascular disease.   CTA H&N - No large vessel occlusion. Mild atherosclerosis in the head and neck without a significant stenosis.   2D Echo EF 60 to 65%  [employer] Virus 2 - negative  EEG - 03/08/20 - This study is within normal limits.   LDL 49  HgbA1c <3.5  UDS - negative  VTE prophylaxis - SCDs  No antithrombotic prior to admission, now on Plavix. Continue on discharge.   Ongoing aggressive stroke risk factor management  Therapy recommendations:  pending  Disposition:  Pending  Hx of afib/aflutter  Status post cardioversion 02/2016  Was on Eliquis, however discontinued due to severe anemia  Follows with cardiology Dr. Desma Maxim in [facility]  On Cardizem  Currently on Plavix.   Given anemia, not a good candidate for [facility] at this time.  Recommend to discuss with GI and  hematology provider when becomes a candidate for [facility] in the future.  Hx of autoimmune hemolytic anemia  Longstanding history of severe anemia  Was on prednisone, discontinued due to GI bleeding  Had most recent blood transfusion in 08/2019 due to severe anemia  This admission hemoglobin 9.9->7.8->7.8  Currently on Plavix  Close H&H monitoring  Hx of UGIB  05/2018 upper GI bleeding status post EGD showed a large duodenal bleeding ulcer due to overdose of ibuprofen and prednisone use  Cauterized and treated with PPI  No more GI bleeding since  Currently on Plavix   History of bilateral DVT  Status post IVC filter in 2018  ?  PE at that time  Not on [facility] due to severe  anemia  Hypotension Hx of hypertension  Home BP meds: amlodipine ; diltiazem ; labetalol, Lasix, metoprolol  Current BP meds: diltiazem  BP stable now . Long-term BP goal normotensive  Chronic hyponatremia  ? SIADH  Na 118 -> 123->125->127->125  EEG - This study is within normal limits. No seizures or epileptiform discharges were seen throughout the recording.  Cortisol and ACTH pending  Treatment per primary team  Hypoglycemia  Glucose 62->88->76->147->117  Hemoglobin A1c < 3.5  Off D5 normal saline  On diet  Close monitoring  Diarrhea   Last Friday diarrhea, but non for the last two days  Diarrhea again this am - ? Related to vanco ??  Other Stroke Risk Factors  Advanced age  Former cigarette smoker - quit  ETOH use, advised to drink no more than 1 alcoholic beverage per day.  Hx of TIAs  Diastolic chronic heart Failure  PVD  CAD  Other Active Problems  Code status - Full code  Pancytopenia - Leukopenia - 3.0->2.5->2.4, and thrombocytopenia 163->122->119  Aortic Atherosclerosis (ICD10-I70.0)    LE cellulitis - on vancomycin - improving  Urine culture - pending  Hospital day # 1  Neurology will sign off. Please call with questions. Pt will follow up with  stroke clinic NP at [facility] in about 4 weeks. Thanks for the consult.   Marvel Plan, MD PhD Stroke Neurology 03/10/2020 11:51 AM       To contact Stroke Continuity provider, please refer to [email or web address]. After hours, contact General Neurology

## 2020-03-10 NOTE — Progress Notes (Signed)
Patient has not voided since I have been here at 7 p.m. Patient not drinking. Has not received any I.V. fluids today. Urine in canister from earlier today very dark tea color urine. R.N.aware . Bladder scan 15 ml And text page Dr. Carren Rang. See new orders.

## 2020-03-10 NOTE — Progress Notes (Signed)
Mobility Specialist - Progress Note   03/10/20 1401  Mobility  Activity Refused mobility    Pt initially refused mobility as she had just returned form imaging. She then later refused due to dizziness, stating that she has yet to get her lunch. Pt says she will walk to the bathroom with her nurse in order to submit a urine sample.   Mamie Levers Mobility Specialist Mobility Specialist Phone: [phone]

## 2020-03-10 NOTE — Progress Notes (Signed)
Dr. Caleb Popp made aware through [facility] that patient abdomen xrays were in chart/ Epic. Will monitor. Roxie Kreeger, Randall An RN

## 2020-03-10 NOTE — Evaluation (Signed)
Occupational Therapy Evaluation Patient Details Name: Beverly Lawrence MRN: [id number] DOB: [birth year] Today's Date: 03/10/2020    History of Present Illness 74 yo admitted with transient Left weakness with negative MRI and normal EEG with hyponatremia. PMHx: PAF, HTN, SIADH, GIB, diarrhea   Clinical Impression   Pt admitted with L sided weakness.  No LUE noted on eval.  Pt states LUE back to normal.   Pt currently with functional limitations due to the deficits listed below (see OT Problem List).  Pt will benefit from skilled OT to increase their safety and independence with ADL and functional mobility for ADL to facilitate discharge to venue listed below.     Follow Up Recommendations  Home health OT;Supervision/Assistance - 24 hour    Equipment Recommendations  None recommended by OT    Recommendations for Other Services       Precautions / Restrictions Precautions Precautions: Fall Precaution Comments: watch sats      Mobility Bed Mobility Overal bed mobility: Modified Independent             General bed mobility comments: increased time with rail  Transfers Overall transfer level: Needs assistance   Transfers: Sit to/from Stand;Stand Pivot Transfers Sit to Stand: Min assist Stand pivot transfers: Min assist       General transfer comment: cues for hand placement, increased time    Balance Overall balance assessment: History of Falls;Needs assistance   Sitting balance-Leahy Scale: Good     Standing balance support: Bilateral upper extremity supported Standing balance-Leahy Scale: Poor Standing balance comment: RW use for gait                           ADL either performed or assessed with clinical judgement   ADL Overall ADL's : Needs assistance/impaired Eating/Feeding: Set up;Sitting   Grooming: Sitting;Minimal assistance   Upper Body Bathing: Minimal assistance;Sitting   Lower Body Bathing: Maximal assistance;Sit to/from stand;Cueing  for safety;Cueing for sequencing   Upper Body Dressing : Minimal assistance;Sitting   Lower Body Dressing: Maximal assistance;Sit to/from stand;Cueing for safety;Cueing for sequencing   Toilet Transfer: Minimal assistance Toilet Transfer Details (indicate cue type and reason): hand held A Toileting- Clothing Manipulation and Hygiene: Moderate assistance;Sit to/from stand;Cueing for safety;Cueing for sequencing         General ADL Comments: Husband present and will A pt at home as needed.  Pt very willing to get OOB     Vision Patient Visual Report: No change from baseline              Pertinent Vitals/Pain Pain Assessment: No/denies pain     Hand Dominance     Extremity/Trunk Assessment         Cervical / Trunk Assessment Cervical / Trunk Assessment: Kyphotic   Communication Communication Communication: HOH   Cognition Arousal/Alertness: Awake/alert Behavior During Therapy: WFL for tasks assessed/performed Overall Cognitive Status: Within Functional Limits for tasks assessed                                                Home Living Family/patient expects to be discharged to:: Private residence Living Arrangements: Spouse/significant other Available Help at Discharge: Family;Available 24 hours/day Type of Home: House       Home Layout: One level     Bathroom Shower/Tub: Walk-in shower  Bathroom Toilet: Handicapped height     Home Equipment: Environmental consultant - 2 wheels;Walker - 4 wheels;Other (comment);Bedside commode;Shower seat;Wheelchair - manual (adjustable bed, lift chair)          Prior Functioning/Environment Level of Independence: Needs assistance  Gait / Transfers Assistance Needed: walks with rollator ADL's / Homemaking Assistance Needed: spouse cooks, daughter does the cleaning and laundry            OT Problem List: Decreased strength;Impaired balance (sitting and/or standing);Decreased activity tolerance;Cardiopulmonary  status limiting activity;Decreased safety awareness      OT Treatment/Interventions: Self-care/ADL training;DME and/or AE instruction;Patient/family education;Energy conservation    OT Goals(Current goals can be found in the care plan section) Acute Rehab OT Goals Patient Stated Goal: return home Time For Goal Achievement: 03/17/20 Potential to Achieve Goals: Good  OT Frequency: Min 2X/week   Barriers to D/C:            Co-evaluation              AM-PAC OT "6 Clicks" Daily Activity     Outcome Measure Help from another person eating meals?: A Little Help from another person taking care of personal grooming?: A Little Help from another person toileting, which includes using toliet, bedpan, or urinal?: A Little Help from another person bathing (including washing, rinsing, drying)?: A Lot Help from another person to put on and taking off regular upper body clothing?: A Little Help from another person to put on and taking off regular lower body clothing?: A Lot 6 Click Score: 16   End of Session Nurse Communication: Mobility status  Activity Tolerance: Patient limited by fatigue Patient left: in chair;with call bell/phone within reach;with family/visitor present  OT Visit Diagnosis: Unsteadiness on feet (R26.81);Muscle weakness (generalized) (M62.81);History of falling (Z91.81)                Time: 1400-1416 OT Time Calculation (min): 16 min Charges:  OT General Charges $OT Visit: 1 Visit OT Evaluation $OT Eval Moderate Complexity: 1 Mod  Lise Auer, OT Acute Rehabilitation Services [phone] Office- [phone]     Donte Lenzo, Karin Golden D 03/10/2020, 3:32 PM

## 2020-03-11 DIAGNOSIS — K567 Ileus, unspecified: Secondary | ICD-10-CM

## 2020-03-11 LAB — BASIC METABOLIC PANEL
Anion gap: 7 (ref 5–15)
Anion gap: 9 (ref 5–15)
BUN: 8 mg/dL (ref 8–23)
BUN: 10 mg/dL (ref 8–23)
CO2: 26 mmol/L (ref 22–32)
CO2: 25 mmol/L (ref 22–32)
Calcium: 8.3 mg/dL — ABNORMAL LOW (ref 8.9–10.3)
Calcium: 8.4 mg/dL — ABNORMAL LOW (ref 8.9–10.3)
Chloride: 93 mmol/L — ABNORMAL LOW (ref 98–111)
Chloride: 93 mmol/L — ABNORMAL LOW (ref 98–111)
Creatinine, Ser: 0.52 mg/dL (ref 0.44–1.00)
Creatinine, Ser: 0.57 mg/dL (ref 0.44–1.00)
GFR calc Af Amer: >60 mL/min (ref >60)
GFR calc Af Amer: >60 mL/min (ref >60)
GFR calc non Af Amer: >60 mL/min (ref >60)
GFR calc non Af Amer: >60 mL/min (ref >60)
Glucose, Bld: 97 mg/dL (ref 70–99)
Glucose, Bld: 113 mg/dL — ABNORMAL HIGH (ref 70–99)
Potassium: 3.9 mmol/L (ref 3.5–5.1)
Potassium: 3.9 mmol/L (ref 3.5–5.1)
Sodium: 126 mmol/L — ABNORMAL LOW (ref 135–145)
Sodium: 127 mmol/L — ABNORMAL LOW (ref 135–145)

## 2020-03-11 LAB — CBC WITH DIFFERENTIAL/PLATELET
Abs Immature Granulocytes: 0.02 10*3/uL (ref 0.00–0.07)
Basophils Absolute: 0 10*3/uL (ref 0.0–0.1)
Basophils Relative: 1 %
Eosinophils Absolute: 0 10*3/uL (ref 0.0–0.5)
Eosinophils Relative: 1 %
HCT: 24.1 % — ABNORMAL LOW (ref 36.0–46.0)
Hemoglobin: 8 g/dL — ABNORMAL LOW (ref 12.0–15.0)
Immature Granulocytes: 1 %
Lymphocytes Relative: 21 %
Lymphs Abs: 0.6 10*3/uL — ABNORMAL LOW (ref 0.7–4.0)
MCH: 38.5 pg — ABNORMAL HIGH (ref 26.0–34.0)
MCHC: 33.2 g/dL (ref 30.0–36.0)
MCV: 115.9 fL — ABNORMAL HIGH (ref 80.0–100.0)
Monocytes Absolute: 0.3 10*3/uL (ref 0.1–1.0)
Monocytes Relative: 11 %
Neutro Abs: 1.8 10*3/uL (ref 1.7–7.7)
Neutrophils Relative %: 65 %
Platelets: 143 10*3/uL — ABNORMAL LOW (ref 150–400)
RBC: 2.08 MIL/uL — ABNORMAL LOW (ref 3.87–5.11)
RDW: 14.4 % (ref 11.5–15.5)
Smear Review: NORMAL
WBC: 2.7 10*3/uL — ABNORMAL LOW (ref 4.0–10.5)
nRBC: 0 % (ref 0.0–0.2)

## 2020-03-11 LAB — CORTISOL-AM, BLOOD: Cortisol - AM: 15.8 ug/dL (ref 6.7–22.6)

## 2020-03-11 LAB — CBC
HCT: 23.8 % — ABNORMAL LOW (ref 36.0–46.0)
Hemoglobin: 7.9 g/dL — ABNORMAL LOW (ref 12.0–15.0)
MCH: 38 pg — ABNORMAL HIGH (ref 26.0–34.0)
MCHC: 33.2 g/dL (ref 30.0–36.0)
MCV: 114.4 fL — ABNORMAL HIGH (ref 80.0–100.0)
Platelets: 136 10*3/uL — ABNORMAL LOW (ref 150–400)
RBC: 2.08 MIL/uL — ABNORMAL LOW (ref 3.87–5.11)
RDW: 14.5 % (ref 11.5–15.5)
WBC: 2.7 10*3/uL — ABNORMAL LOW (ref 4.0–10.5)
nRBC: 0 % (ref 0.0–0.2)

## 2020-03-11 LAB — URINE CULTURE

## 2020-03-11 MED ORDER — DOXYCYCLINE HYCLATE 100 MG PO TABS
100.0000 mg | ORAL_TABLET | Freq: Two times a day (BID) | ORAL | Status: DC
  Administered 2020-03-11 – 2020-03-13 (×4): 100 mg via ORAL
  Filled 2020-03-11 (×4): qty 1

## 2020-03-11 MED ORDER — SODIUM CHLORIDE 0.9 % IV SOLN: INTRAVENOUS | Status: DC

## 2020-03-11 MED ORDER — LOPERAMIDE HCL 2 MG PO CAPS
2.0000 mg | ORAL_CAPSULE | Freq: Once | ORAL | Status: AC
  Administered 2020-03-11: 2 mg via ORAL
  Filled 2020-03-11: qty 1

## 2020-03-11 NOTE — Progress Notes (Addendum)
PROGRESS NOTE    Beverly Lawrence  XLK:[id number] DOB: 24-Jan-1946 DOA: 03/07/2020 PCP: Malka SoJobe, Daniel B., MD   Brief Narrative: Beverly Lawrence is a [age] y.o. female with past medical history significant for PAF, HTN, chronic loose stools at 2-3 per day, SIADH, hemolytic anemia, h/o GI bleed 2 years ago. Patient presented secondary to left sided weakness and admitted for concern for TIA. Patient found to have hypoglycemia in addition to hyponatremia. TIA workup initiated in addition to treatment for hyponatremia (likely hypovolemic on admission) and hypoglycemia.   Assessment & Plan:   Active Problems:   Hypertension   Autoimmune hemolytic anemia   TIA (transient ischemic attack)   Hypoglycemia without diagnosis of diabetes mellitus   (HFpEF) heart failure with preserved ejection fraction (HCC)   Hyponatremia   Hyponatremia Unsure of etiology; patient has a history of SIADH but also has recent history is of hyponatremia secondary to volume overload requiring Lasix. Patient has been treated as hypovolemic hyponatremia with IV fluids. It was an initial improvement with a peak of 127 with some decrease sodium overnight. Patient was given normal saline infusion overnight which has stopped this morning. Possibility that this is related to adrenal insufficiency however that diagnosis is being worked up.  Overall, patient looks euvolemic at this time. BMP pending this morning -Will likely resume normal saline IV today -AM BMP and AM urine sodium/osmolality  Left sided weakness No stroke seen on CT head or MRI.  Neurology consulted for evaluation.  Possible TIA versus sequela from hypoglycemia or hyponatremia.  Neurology recommending to continue Plavix.  With patient's history of atrial fibrillation, patient would benefit from anticoagulation however this was previously discontinued secondary to history of autoimmune hemolytic disease in addition to GI bleeding. Symptoms  resolved.  Hypoglycemia Unknown etiology.  Patient does not take any hypoglycemic agents as an outpatient.  Currently evaluating for possible with hypoglycemia.  Patient is not hypoglycemic here in the hospital since admission.  Hemoglobin A1c obtained and is undetectable at less than 3.5%. It is possible this could be related to possible adrenal disease  Abnormal ACTH stimulation test It does not appear this was performed correctly initially as there was a delay between administration of cosyntropin and measurements of cortisol. Repeat ACTH stimulation test/ACTH pending; obtained 9/7. AM cortisol obtained and is normal. -Follow-up ACTH  Pancytopenia ITP Autoimmune hemolytic anemia Associated lymphocytopenia without neutropenia. Platelets trending down. Afebrile. Unsure of etiology of leukopenia at this time but possibly related to underlying infection if UTI present.  Patient does have a history of autoimmune hemolytic anemia and hemoglobin is currently stable. -Add differential to CBC this morning -Pathology smear review  Chronic diastolic heart failure Recent echocardiogram from 9/4 significant for an EF of 60 to 65% with grade 1 diastolic dysfunction.  Currently not in heart failure.  Currently not fluid overloaded.  Abdominal pain Associated hyperactive bowel sounds.  Abdominal x-ray was obtained and was significant for possible ileus versus early bowel obstruction.  Patient does not have associated vomiting but was nauseated earlier.  Patient has a history of multiple abdominal surgeries in addition to previous bowel obstruction requiring surgical management. CT abdomen/pelvis significant for evidence of ileus. Some nausea but no vomiting -Watch symptoms closely for worsening ileus/development of SBO (high risk)  Diarrhea Chronic issue. Per patient, symptoms are not out of the ordinary. -Will hold off on imodium for now secondary to ileus  Dysuria Previous urine culture with  multiple species.  Patient's urine looks tea colored  in canister.  Most recent urinalysis not very remarkable except for small amounts of glucose in addition to small amount of ketones. Repeat urinalysis is unremarkable. -Follow-up repeat urine culture  Tea colored urine Urinalysis unremarkable. Possibly urine mixed with stool as patient has had diarrhea.  Cellulitis Present earlier in admission and treated empirically on Vancomycin with improvement. -Transition to doxycycline BID to complete 7 days   DVT prophylaxis: SCDs Code Status:   Code Status: Full Code Family Communication: None at bedside Disposition Plan: Discharge home with home health PT per PT recommendations pending improvement of hyponatremia in addition to work-up for abdominal distention, dysuria   Consultants:   Neurology  Procedures:   TRANSTHORACIC ECHOCARDIOGRAM (03/08/2020) IMPRESSIONS    1. Left ventricular ejection fraction, by estimation, is 60 to 65%. The  left ventricle has normal function. The left ventricle has no regional  wall motion abnormalities. There is mild left ventricular hypertrophy.  Left ventricular diastolic parameters  are consistent with Grade I diastolic dysfunction (impaired relaxation).  2. Right ventricular systolic function is moderately reduced. The right  ventricular size is mildly enlarged. There is mildly elevated pulmonary  artery systolic pressure. The estimated right ventricular systolic  pressure is 40.9 mmHg.  3. Left atrial size was mildly dilated.  4. The mitral valve is normal in structure. No evidence of mitral valve  regurgitation.  5. The aortic valve is tricuspid. Aortic valve regurgitation is mild. No  aortic stenosis is present.  6. The inferior vena cava is normal in size with greater than 50%  respiratory variability, suggesting right atrial pressure of 3 mmHg.   Antimicrobials:  Vancomycin   Subjective: Continued diarrhea. Some lower  abdominal/suprapubic pain. No issues with voiding.  Objective: Vitals:   03/10/20 2144 03/10/20 2331 03/11/20 0330 03/11/20 0500  BP: 135/75 (!) 139/103 119/72   Pulse: 75 80 60   Resp: 20 15 11    Temp: 98.7 F (37.1 C) 98.8 F (37.1 C)    TempSrc: Oral Oral Oral   SpO2: 98% 100% 99%   Weight:    57 kg  Height:        Intake/Output Summary (Last 24 hours) at 03/11/2020 05/11/2020 Last data filed at 03/11/2020 0600 Gross per 24 hour  Intake 1720 ml  Output 100 ml  Net 1620 ml   Filed Weights   03/07/20 2100 03/10/20 0622 03/11/20 0500  Weight: 57.5 kg 56.3 kg 57 kg    Examination:  General exam: Appears calm and comfortable Respiratory system: Clear to auscultation. Respiratory effort normal. Cardiovascular system: S1 & S2 heard, RRR. Systolic murmur. Gastrointestinal system: Abdomen is distended, soft and mildly tender in lower quadrant. No organomegaly or masses felt. Decreased bowel sounds heard. Central nervous system: Alert and oriented. No focal neurological deficits. Musculoskeletal: No edema. No calf tenderness Skin: No cyanosis. No rashes Psychiatry: Judgement and insight appear normal. Mood & affect appropriate.      Data Reviewed: I have personally reviewed following labs and imaging studies  CBC Lab Results  Component Value Date   WBC 2.7 (L) 03/11/2020   RBC 2.08 (L) 03/11/2020   HGB 7.9 (L) 03/11/2020   HCT 23.8 (L) 03/11/2020   MCV 114.4 (H) 03/11/2020   MCH 38.0 (H) 03/11/2020   PLT 136 (L) 03/11/2020   MCHC 33.2 03/11/2020   RDW 14.5 03/11/2020   LYMPHSABS 0.6 (L) 03/07/2020   MONOABS 0.2 03/07/2020   EOSABS 0.0 03/07/2020   BASOSABS 0.0 03/07/2020     Last metabolic  panel Lab Results  Component Value Date   NA 125 (L) 03/10/2020   K 4.1 03/10/2020   CL 92 (L) 03/10/2020   CO2 25 03/10/2020   BUN 10 03/10/2020   CREATININE 0.50 03/10/2020   GLUCOSE 99 03/10/2020   GFRNONAA >60 03/10/2020   GFRAA >60 03/10/2020   CALCIUM 8.3 (L)  03/10/2020   PHOS 4.0 05/16/2015   PROT 6.2 (L) 03/07/2020   ALBUMIN 3.3 (L) 03/07/2020   BILITOT 2.4 (H) 03/07/2020   ALKPHOS 88 03/07/2020   AST 41 03/07/2020   ALT 17 03/07/2020   ANIONGAP 8 03/10/2020    CBG (last 3)  Recent Labs    03/08/20 1829  GLUCAP 124*     GFR: Estimated Creatinine Clearance: 50.3 mL/min (by C-G formula based on SCr of 0.5 mg/dL).  Coagulation Profile: Recent Labs  Lab 03/07/20 2144  INR 1.2    Recent Results (from the past 240 hour(s))  Urine culture     Status: Abnormal   Collection Time: 03/08/20 12:45 AM   Specimen: Urine, Random  Result Value Ref Range Status   Specimen Description URINE, RANDOM  Final   Special Requests   Final    NONE Performed at [facility], [address], [place], [place] [address]    Culture (A)  Final    60,000 COLONIES/mL MULTIPLE SPECIES PRESENT, SUGGEST RECOLLECTION   Report Status 03/09/2020 FINAL  Final  SARS Coronavirus 2 by RT PCR (hospital order, performed in [facility] hospital lab) Nasopharyngeal Nasopharyngeal Swab     Status: None   Collection Time: 03/08/20  1:00 AM   Specimen: Nasopharyngeal Swab  Result Value Ref Range Status   SARS Coronavirus 2 NEGATIVE NEGATIVE Final    Comment: (NOTE) SARS-CoV-2 target nucleic acids are NOT DETECTED.  The SARS-CoV-2 RNA is generally detectable in upper and lower respiratory specimens during the acute phase of infection. The lowest concentration of SARS-CoV-2 viral copies this assay can detect is 250 copies / mL. A negative result does not preclude SARS-CoV-2 infection and should not be used as the sole basis for treatment or other patient management decisions.  A negative result may occur with improper specimen collection / handling, submission of specimen other than nasopharyngeal swab, presence of viral mutation(s) within the areas targeted by this assay, and inadequate number of viral copies (<250 copies / mL). A negative result must be  combined with clinical observations, patient history, and epidemiological information.  Fact Sheet for Patients:   BoilerBrush.com.cy  Fact Sheet for Healthcare Providers: https://pope.com/  This test is not yet approved or  cleared by the [place] FDA and has been authorized for detection and/or diagnosis of SARS-CoV-2 by FDA under an Emergency Use Authorization (EUA).  This EUA will remain in effect (meaning this test can be used) for the duration of the COVID-19 declaration under Section 564(b)(1) of the Act, 21 U.S.C. section 360bbb-3(b)(1), unless the authorization is terminated or revoked sooner.  Performed at [facility], [address] [place], [place] [address]         Radiology Studies: CT ABDOMEN PELVIS WO CONTRAST  Result Date: 03/10/2020 CLINICAL DATA:  Abdominal pain.  Evaluate for obstruction EXAM: CT ABDOMEN AND PELVIS WITHOUT CONTRAST TECHNIQUE: Multidetector CT imaging of the abdomen and pelvis was performed following the standard protocol without IV contrast. COMPARISON:  Plain film of earlier today. Most recent CT 10/22/2018 from [facility] FINDINGS: Lower chest: Emphysema. A 7 mm right lower  lobe pulmonary nodule is similar back to 2017 and considered benign. Left base scarring. Mild cardiomegaly. Multivessel coronary artery atherosclerosis. Hepatobiliary: Normal noncontrast appearance of the liver. Normal gallbladder, without biliary ductal dilatation. Pancreas: Normal, without mass or ductal dilatation. Spleen: Normal in size, without focal abnormality. Adrenals/Urinary Tract: Normal adrenal glands. No renal calculi or hydronephrosis. Upper pole left renal low-density lesion is likely a cyst. There is a lower pole left renal 1.3 cm lesion which is similar in size on the prior, greater than fluid density on 31/3. An interpolar right renal 1.3 cm lesion is also similar in size to on the prior exam,  slightly greater than fluid density and favored to represent a minimally complex cyst. No bladder calculi. Stomach/Bowel: Normal stomach, without wall thickening. Fluid-filled colon, suggesting a diarrheal state. Suspect surgical sutures within the sigmoid. Gas-filled colon of up to 5.6 cm versus 7.3 cm on the prior CT. The cecum extends into the central pelvis. Normal caliber of small bowel loops. Vascular/Lymphatic: Advanced aortic and branch vessel atherosclerosis. IVC filter. No abdominopelvic adenopathy. Reproductive: Hysterectomy versus uterine atrophy.  No adnexal mass. Other: Moderate pelvic floor laxity. No free pelvic fluid. Trace perihepatic ascites. No free intraperitoneal air. Anasarca. Musculoskeletal: Osteopenia. Convex right lumbar spine curvature. Superior endplate L1 compression deformity without ventral canal encroachment. IMPRESSION: 1. Diffuse colonic distension, suggesting colonic ileus. Fluid-filled colon may represent a concurrent diarrheal state. No findings of obstruction. 2. Coronary artery atherosclerosis. Aortic Atherosclerosis (ICD10-I70.0). 3. Bilateral renal lesions. Technically indeterminate lesions which are unchanged in size, most likely complex cysts. If not already evaluated, these could be characterized with pre and post contrast abdominal CT or MRI at 6-12 months. 4. Trace perihepatic ascites. Electronically Signed   By: Jeronimo Greaves M.D.   On: 03/10/2020 18:52   DG Abd 2 Views  Result Date: 03/10/2020 CLINICAL DATA:  Weakness.  Hyperglycemia. EXAM: ABDOMEN - 2 VIEW COMPARISON:  December 29, 2017 FINDINGS: Distended bowel loops likely represent distended colon with maximum diameter of 7.7 cm. No definite small bowel obstruction. IVC filter place. Calcific atherosclerotic disease and tortuosity of the aorta. IMPRESSION: 1. Distended bowel loops likely represent distended colon with maximum diameter of 7.7 cm. Findings may represent colonic ileus or early colonic obstruction. 2.  Calcific atherosclerotic disease of the aorta. Electronically Signed   By: Ted Mcalpine M.D.   On: 03/10/2020 13:40        Scheduled Meds: . clopidogrel  75 mg Oral Daily  . diltiazem  180 mg Oral Daily  . folic acid  1 mg Oral BID  . oxybutynin  5 mg Oral QHS  . pantoprazole  40 mg Oral BID  . sodium chloride flush  3 mL Intravenous Q12H   Continuous Infusions: . sodium chloride    . dextrose 5 % and 0.9% NaCl Stopped (03/08/20 2023)  . vancomycin 1,000 mg (03/10/20 2300)     LOS: 2 days     Jacquelin Hawking, MD [facility] 03/11/2020, 7:12 AM  If 7PM-7AM, please contact night-coverage [email or web address]

## 2020-03-11 NOTE — Progress Notes (Signed)
Report given to [place], RN on [address] at this time.

## 2020-03-11 NOTE — Progress Notes (Signed)
Mobility Specialist - Progress Note   03/11/20 1315  Mobility  Activity Refused mobility   Pt refused mobility due to feeling weak after PT.   Beverly Lawrence Mobility Specialist Mobility Specialist Phone: [phone]

## 2020-03-11 NOTE — Progress Notes (Signed)
Physical Therapy Treatment Patient Details Name: Beverly Lawrence MRN: [id number] DOB: 05-Jun-1946 Today's Date: 03/11/2020    History of Present Illness 74 yo admitted with transient Left weakness with negative MRI and normal EEG with hyponatremia. PMHx: PAF, HTN, SIADH, GIB, diarrhea    PT Comments    Pt supine on arrival with complaints of being unable to get up from toilet yesterday, IV not working and fatigue. Pt willing to get up and move within room but would not agree to increased gait trials or distance and educated for need to continue to increase mobility including walking to bathroom, OOB for meals and HEP. Pt remains fixed on returning home and knows she must maintain mobility for this to be a reality. Pt on 2L throughout session with sats >95%    Follow Up Recommendations  Home health PT;Supervision/Assistance - 24 hour     Equipment Recommendations  None recommended by PT    Recommendations for Other Services       Precautions / Restrictions Precautions Precautions: Fall    Mobility  Bed Mobility Overal bed mobility: Modified Independent             General bed mobility comments: increased time with rail, HOB flat  Transfers Overall transfer level: Needs assistance   Transfers: Sit to/from Stand Sit to Stand: Min guard         General transfer comment: cues for hand placement, increased time, guarding for lines to rise from bed  Ambulation/Gait Ambulation/Gait assistance: Min guard Gait Distance (Feet): 35 Feet Assistive device: Rolling walker (2 wheeled) Gait Pattern/deviations: Step-through pattern;Decreased stride length;Trunk flexed   Gait velocity interpretation: 1.31 - 2.62 ft/sec, indicative of limited community ambulator General Gait Details: cues for safety with assist for lines and O2, maintained 98% on 2L with gait   Stairs             Wheelchair Mobility    Modified Rankin (Stroke Patients Only)       Balance Overall  balance assessment: History of Falls;Needs assistance   Sitting balance-Leahy Scale: Good Sitting balance - Comments: EOB without assist   Standing balance support: Bilateral upper extremity supported Standing balance-Leahy Scale: Poor Standing balance comment: RW use for gait                            Cognition Arousal/Alertness: Awake/alert Behavior During Therapy: WFL for tasks assessed/performed Overall Cognitive Status: Within Functional Limits for tasks assessed                                        Exercises General Exercises - Lower Extremity Long Arc Quad: AROM;Both;Seated;20 reps Hip ABduction/ADduction: AROM;Both;10 reps;Seated Hip Flexion/Marching: AROM;Both;Seated;10 reps Toe Raises: AROM;Both;Seated;15 reps Heel Raises: AROM;Both;Seated;15 reps    General Comments        Pertinent Vitals/Pain Pain Score: 4  Pain Location: shoulder and upper arm LUE Pain Descriptors / Indicators: Aching Pain Intervention(s): Limited activity within patient's tolerance;Repositioned    Home Living                      Prior Function            PT Goals (current goals can now be found in the care plan section) Progress towards PT goals: Progressing toward goals    Frequency    Min 3X/week  PT Plan Current plan remains appropriate    Co-evaluation              AM-PAC PT "6 Clicks" Mobility   Outcome Measure  Help needed turning from your back to your side while in a flat bed without using bedrails?: A Little Help needed moving from lying on your back to sitting on the side of a flat bed without using bedrails?: A Little Help needed moving to and from a bed to a chair (including a wheelchair)?: A Little Help needed standing up from a chair using your arms (e.g., wheelchair or bedside chair)?: A Little Help needed to walk in hospital room?: A Little Help needed climbing 3-5 steps with a railing? : A Lot 6 Click  Score: 17    End of Session Equipment Utilized During Treatment: Gait belt;Oxygen Activity Tolerance: Patient tolerated treatment well Patient left: in chair;with call bell/phone within reach;with chair alarm set;with family/visitor present Nurse Communication: Mobility status PT Visit Diagnosis: Other abnormalities of gait and mobility (R26.89);Muscle weakness (generalized) (M62.81)     Time: 3474-2595 PT Time Calculation (min) (ACUTE ONLY): 26 min  Charges:  $Gait Training: 8-22 mins $Therapeutic Exercise: 8-22 mins                     Ladarrius Bogdanski P, PT Acute Rehabilitation Services Pager: [phone] Office: [phone]    Alfhild Partch B Ashley Bultema 03/11/2020, 11:38 AM

## 2020-03-11 NOTE — Plan of Care (Signed)
Continue to monitor

## 2020-03-11 NOTE — Plan of Care (Signed)

## 2020-03-12 ENCOUNTER — Inpatient Hospital Stay (HOSPITAL_COMMUNITY): Payer: Medicare Other

## 2020-03-12 LAB — BASIC METABOLIC PANEL
Anion gap: 4 — ABNORMAL LOW (ref 5–15)
BUN: 8 mg/dL (ref 8–23)
CO2: 28 mmol/L (ref 22–32)
Calcium: 8.1 mg/dL — ABNORMAL LOW (ref 8.9–10.3)
Chloride: 95 mmol/L — ABNORMAL LOW (ref 98–111)
Creatinine, Ser: 0.49 mg/dL (ref 0.44–1.00)
GFR calc Af Amer: >60 mL/min (ref >60)
GFR calc non Af Amer: >60 mL/min (ref >60)
Glucose, Bld: 89 mg/dL (ref 70–99)
Potassium: 3.7 mmol/L (ref 3.5–5.1)
Sodium: 127 mmol/L — ABNORMAL LOW (ref 135–145)

## 2020-03-12 LAB — ACTH: C206 ACTH: 57 pg/mL (ref 7.2–63.3)

## 2020-03-12 MED ORDER — THIAMINE HCL 100 MG/ML IJ SOLN: 100.0000 mg | Freq: Every day | INTRAMUSCULAR | Status: DC

## 2020-03-12 MED ORDER — HYDROCORTISONE 10 MG PO TABS
10.0000 mg | ORAL_TABLET | Freq: Every day | ORAL | Status: DC
  Administered 2020-03-13 – 2020-03-16 (×4): 10 mg via ORAL
  Filled 2020-03-12 (×5): qty 1

## 2020-03-12 MED ORDER — HYDROCORTISONE 5 MG PO TABS
15.0000 mg | ORAL_TABLET | Freq: Every day | ORAL | Status: DC
  Administered 2020-03-12 – 2020-03-17 (×6): 15 mg via ORAL
  Filled 2020-03-12 (×6): qty 1

## 2020-03-12 MED ORDER — LORAZEPAM 1 MG PO TABS: 1.0000 mg | ORAL_TABLET | ORAL | Status: DC | PRN

## 2020-03-12 MED ORDER — THIAMINE HCL 100 MG PO TABS
100.0000 mg | ORAL_TABLET | Freq: Every day | ORAL | Status: DC
  Administered 2020-03-12 – 2020-03-20 (×9): 100 mg via ORAL
  Filled 2020-03-12 (×9): qty 1

## 2020-03-12 MED ORDER — LORAZEPAM 2 MG/ML IJ SOLN: 1.0000 mg | INTRAMUSCULAR | Status: DC | PRN

## 2020-03-12 MED ORDER — ADULT MULTIVITAMIN W/MINERALS CH
1.0000 | ORAL_TABLET | Freq: Every day | ORAL | Status: DC
  Administered 2020-03-12 – 2020-03-20 (×9): 1 via ORAL
  Filled 2020-03-12 (×9): qty 1

## 2020-03-12 NOTE — TOC Initial Note (Signed)
Transition of Care [facility]) - Initial/Assessment Note    Patient Details  Name: Beverly Lawrence MRN: [id number] Date of Birth: September 03, 1945  [facility] CM/SW Contact:    Pollie Friar, RN Phone Number: 03/12/2020, 3:57 PM  Clinical Narrative:                 Pt states she has all needed DME. Has oxygen at home through [place].  Pt with recommendations for [facility] services. CM met with the patient and she has used [facility] in the past and would like to use them again. Drew with [facility] accepted the referral.  Pts spouse does most of the house work except pts daughter helps every other day.  Pt denies issues with home medications or transportation.  CM provided her resources for outpatient/ inpatient counseling for alcohol use.  TOC following for further d/c needs.  Expected Discharge Plan: [place] Barriers to Discharge: Continued Medical Work up   Patient Goals and CMS Choice   CMS Medicare.gov Compare Post Acute Care list provided to:: Patient Choice offered to / list presented to : Patient  Expected Discharge Plan and Services Expected Discharge Plan: [place]   Discharge Planning Services: CM Consult Post Acute Care Choice: [place] arrangements for the past 2 months: Single Family Home                           HH Arranged: PT, OT          Prior Living Arrangements/Services Living arrangements for the past 2 months: Single Family Home Lives with:: Spouse Patient language and need for interpreter reviewed:: Yes Do you feel safe going back to the place where you live?: Yes      Need for Family Participation in Patient Care: Yes (Comment) Care giver support system in place?: Yes (comment) (pt states spouse can provide needed supervision.) Current home services: DME (walker/ rollator/ shower seat/ 3 in 1/ adjustable bed) Criminal Activity/Legal Involvement Pertinent to Current Situation/Hospitalization: No  - Comment as needed  Activities of Daily Living      Permission Sought/Granted                  Emotional Assessment Appearance:: Appears stated age Attitude/Demeanor/Rapport: Engaged Affect (typically observed): Accepting Orientation: : Oriented to Self, Oriented to Place, Oriented to  Time, Oriented to Situation   Psych Involvement: No (comment)  Admission diagnosis:  TIA (transient ischemic attack) [G45.9] Hyponatremia [E87.1] Hypoglycemia [E16.2] Patient Active Problem List   Diagnosis Date Noted  . Ileus ([place]) 03/11/2020  . TIA (transient ischemic attack) 03/08/2020  . Hypoglycemia without diagnosis of diabetes mellitus 03/08/2020  . (HFpEF) heart failure with preserved ejection fraction ([place]) 03/08/2020  . Hyponatremia 03/08/2020  . Generalized abdominal pain   . Nausea   . Hypertension   . Autoimmune hemolytic anemia   . DVT (deep venous thrombosis) ([place])   . 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 Chronic sinusitis 05/15/2015   PCP:  Lilian Coma., MD Pharmacy:   [id number] - [place], [place] - [address] [place] [address] Phone: [phone] Fax: [phone]     Social Determinants of Health (SDOH) Interventions    Readmission Risk Interventions No flowsheet data found.

## 2020-03-12 NOTE — Progress Notes (Signed)
Occupational Therapy Treatment Patient Details Name: Beverly Lawrence MRN: [id number] DOB: 1946-05-16 Today's Date: 03/12/2020    History of present illness 74 yo admitted with transient Left weakness with negative MRI and normal EEG with hyponatremia. PMHx: PAF, HTN, SIADH, GIB, diarrhea   OT comments  Patient continues to make progress towards goals in skilled OT session. Patient's session encompassed OOB transfer to the chair as pt had not eaten and she stated "That's all Im willing to do". Pt continues to complain of stomach pain, and impulsive in all movement, especially at EOB. Pt attempted to stand x1, and had posterior LOB falling back onto the bed. Pt became increasingly anxious, however despite cues for appropriate movement to the chair, remained impulsive and demonstrated decreased safety awareness in transfers. Education provided about fall prevention and that therapist would bring a handout, to which pt stated "I know all of those, dont bother" but did accurately list 5 different fall prevention strategies. Discharge remains appropriate, will continue to follow acutely.    Follow Up Recommendations  Home health OT;Supervision/Assistance - 24 hour    Equipment Recommendations  None recommended by OT    Recommendations for Other Services      Precautions / Restrictions Precautions Precautions: Fall Precaution Comments: watch sats Restrictions Weight Bearing Restrictions: No       Mobility Bed Mobility Overal bed mobility: Modified Independent             General bed mobility comments: increased time with rail, HOB flat  Transfers Overall transfer level: Needs assistance Equipment used: Rolling walker (2 wheeled) Transfers: Sit to/from Stand Sit to Stand: Min assist         General transfer comment: cues for hand placement, increased time, guarding for lines to rise from bed, one posterior LOB x1, required increased time to calm down due to anxiety, then  demonstrated poor safety awarenes and movement a second time despite cues    Balance Overall balance assessment: History of Falls;Needs assistance   Sitting balance-Leahy Scale: Good Sitting balance - Comments: EOB without assist   Standing balance support: Bilateral upper extremity supported Standing balance-Leahy Scale: Poor Standing balance comment: One posterior LOB                           ADL either performed or assessed with clinical judgement   ADL Overall ADL's : Needs assistance/impaired Eating/Feeding: Set up;Sitting                                   Functional mobility during ADLs: Minimal assistance;Cueing for safety;Cueing for sequencing;Rolling walker General ADL Comments: Session focus on OOB and into chair as pt had not eaten stating "thats all Im going to do right now"     Vision       Perception     Praxis      Cognition Arousal/Alertness: Awake/alert Behavior During Therapy: WFL for tasks assessed/performed Overall Cognitive Status: Impaired/Different from baseline Area of Impairment: Safety/judgement;Awareness;Following commands                       Following Commands: Follows multi-step commands inconsistently;Follows one step commands consistently Safety/Judgement: Decreased awareness of safety;Decreased awareness of deficits Awareness: Emergent   General Comments: Pt easily distracted, impulsive, and with decreased safety awareness throughout session        Exercises  Shoulder Instructions       General Comments      Pertinent Vitals/ Pain       Pain Assessment: Faces Faces Pain Scale: Hurts a little bit Pain Location: stomach Pain Descriptors / Indicators: Aching;Cramping;Discomfort Pain Intervention(s): Limited activity within patient's tolerance;Monitored during session;Repositioned  Home Living                                          Prior Functioning/Environment               Frequency  Min 2X/week        Progress Toward Goals  OT Goals(current goals can now be found in the care plan section)  Progress towards OT goals: Progressing toward goals  Acute Rehab OT Goals Patient Stated Goal: return home Time For Goal Achievement: 03/17/20 Potential to Achieve Goals: Good ADL Goals Pt Will Perform Lower Body Bathing: with supervision;sit to/from stand Pt Will Perform Lower Body Dressing: with supervision;sit to/from stand Pt Will Transfer to Toilet: with modified independence;ambulating Pt Will Perform Toileting - Clothing Manipulation and hygiene: with modified independence;sit to/from stand;sitting/lateral leans Additional ADL Goal #1: Pt will independently verbalize 3 strateggies to reduce risk of falls  Plan Discharge plan remains appropriate    Co-evaluation                 AM-PAC OT "6 Clicks" Daily Activity     Outcome Measure   Help from another person eating meals?: A Little Help from another person taking care of personal grooming?: A Little Help from another person toileting, which includes using toliet, bedpan, or urinal?: A Little Help from another person bathing (including washing, rinsing, drying)?: A Lot Help from another person to put on and taking off regular upper body clothing?: A Little Help from another person to put on and taking off regular lower body clothing?: A Lot 6 Click Score: 16    End of Session Equipment Utilized During Treatment: Gait belt;Rolling walker  OT Visit Diagnosis: Unsteadiness on feet (R26.81);Muscle weakness (generalized) (M62.81);History of falling (Z91.81)   Activity Tolerance Patient limited by fatigue   Patient Left in chair;with call bell/phone within reach   Nurse Communication Mobility status        Time: 1000-1017 OT Time Calculation (min): 17 min  Charges: OT General Charges $OT Visit: 1 Visit OT Treatments $Self Care/Home Management : 8-22 mins  Pollyann Glen E.  Kenyatte Gruber, COTA/L Acute Rehabilitation Services [phone]   Cherlyn Cushing 03/12/2020, 11:22 AM

## 2020-03-12 NOTE — Progress Notes (Signed)
Pt due a urine sample, pt incontinent and purwick device not consistently in place to collect enough urine. Will conti to assess device for urine sample.

## 2020-03-12 NOTE — Plan of Care (Signed)

## 2020-03-12 NOTE — Progress Notes (Signed)
Progress Note    Beverly Lawrence  ZOX:[id number] DOB: 17-Apr-1946  DOA: 03/07/2020 PCP: Malka So., MD    Brief Narrative:   Medical records reviewed and are as summarized below:  Beverly Lawrence is an 74 y.o. female with past medical history significant for PAF, HTN, chronic loose stools at 2-3 per day, SIADH, hemolytic anemia, h/o GI bleed 2 years ago. Patient presented secondary to left sided weakness and admitted for concern for TIA. Patient found to have hypoglycemia in addition to hyponatremia. TIA workup initiated in addition to treatment for hyponatremia (likely hypovolemic on admission) and hypoglycemia.  Assessment/Plan:   Active Problems:   Hypertension   Autoimmune hemolytic anemia   TIA (transient ischemic attack)   Hypoglycemia without diagnosis of diabetes mellitus   (HFpEF) heart failure with preserved ejection fraction (HCC)   Hyponatremia   Ileus (HCC)   Hyponatremia Unsure of etiology; patient has a history of SIADH but also has recent history is of hyponatremia secondary to volume overload requiring Lasix. Patient has been treated as hypovolemic hyponatremia with IV fluids. -continue to monitor  Left sided weakness No stroke seen on CT head or MRI.  Neurology consulted for evaluation.  Possible TIA versus sequela from hypoglycemia or hyponatremia.  Neurology recommending to continue Plavix.  With patient's history of atrial fibrillation, patient would benefit from anticoagulation however this was previously discontinued secondary to history of autoimmune hemolytic disease in addition to GI bleeding.  Hypoglycemia  Hemoglobin A1c obtained and is undetectable at less than 3.5%.  ? related to possible adrenal disease vs liver disease  Abnormal ACTH stimulation test It does not appear this was performed correctly  -Repeat ACTH stimulation test/ACTH pending; obtained 9/7 -patient clinically looks like adrenal insuff so will treat with cortef for now (had  been on high dose steroids in past 20 mg TID but per MAR was no longer taking)  Pancytopenia ITP Autoimmune hemolytic anemia -? Liver related -uses alcohol daily  (3-4 glasses of wine)  Chronic diastolic heart failure Recent echocardiogram from 9/4 significant for an EF of 60 to 65% with grade 1 diastolic dysfunction.  Currently not in heart failure.  Currently not fluid overloaded.  Ileus -  Patient has a history of multiple abdominal surgeries in addition to previous bowel obstruction requiring surgical management. CT abdomen/pelvis significant for evidence of ileus. Some nausea but no vomiting -Watch symptoms closely for worsening ileus/development of SBO (high risk) -full liquids for now and monitor  Diarrhea Chronic issue. Per patient, symptoms are not out of the ordinary. -NO imodium for now secondary to ileus  Dysuria Previous urine culture with multiple species.  Patient's urine looks tea colored in canister.  Most recent urinalysis not very remarkable except for small amounts of glucose in addition to small amount of ketones. Repeat urinalysis is unremarkable.  Tea colored urine Urinalysis unremarkable. Possibly urine mixed with stool as patient has had diarrhea.  Cellulitis Present earlier in admission and treated empirically on Vancomycin with improvement. -Transition to doxycycline BID to complete 5 days   Family Communication/Anticipated D/C date and plan/Code Status   DVT prophylaxis: Lovenox ordered. Code Status: Full Code.  Disposition Plan: Status is: Inpatient  Remains inpatient appropriate because:IV treatments appropriate due to intensity of illness or inability to take PO   Dispo: The patient is from: Home              Anticipated d/c is to: Home  Anticipated d/c date is: 2 days              Patient currently is not medically stable to d/c.         Medical Consultants:    None.     Subjective:   C/o continued  diarrhea and abdominal pain  Objective:    Vitals:   03/12/20 0351 03/12/20 0710 03/12/20 0715 03/12/20 1137  BP: 115/62  123/68 103/76  Pulse: 69  68 74  Resp: 17 10 18 18   Temp: 98.9 F (37.2 C)  99 F (37.2 C) 98.6 F (37 C)  TempSrc: Oral  Oral Oral  SpO2: 99% 100% 100% 100%  Weight:      Height:        Intake/Output Summary (Last 24 hours) at 03/12/2020 1339 Last data filed at 03/12/2020 0900 Gross per 24 hour  Intake 1454.46 ml  Output --  Net 1454.46 ml   Filed Weights   03/10/20 0622 03/11/20 0500 03/12/20 0348  Weight: 56.3 kg 57 kg 60.2 kg    Exam:  General: Appearance:    Chronically ill appearing female in no acute distress   Abdomen distended, limited bowel sounds heard  Lungs:      respirations unlabored  Heart:    Normal heart rate. Normal rhythm. No murmurs, rubs, or gallops.   MS:   All extremities are intact.   Neurologic:   Awake, alert    Data Reviewed:   I have personally reviewed following labs and imaging studies:  Labs: Labs show the following:   Basic Metabolic Panel: Recent Labs  Lab 03/10/20 0258 03/10/20 0258 03/10/20 2029 03/10/20 2029 03/11/20 0613 03/11/20 0613 03/11/20 1513 03/12/20 0256  NA 125*  --  125*  --  126*  --  127* 127*  K 4.1   < > 4.1   < > 3.9   < > 3.9 3.7  CL 93*  --  92*  --  93*  --  93* 95*  CO2 26  --  25  --  26  --  25 28  GLUCOSE 107*  --  99  --  97  --  113* 89  BUN 9  --  10  --  8  --  10 8  CREATININE 0.59  --  0.50  --  0.52  --  0.57 0.49  CALCIUM 8.1*  --  8.3*  --  8.3*  --  8.4* 8.1*   < > = values in this interval not displayed.   GFR Estimated Creatinine Clearance: 50.3 mL/min (by C-G formula based on SCr of 0.49 mg/dL). Liver Function Tests: Recent Labs  Lab 03/07/20 2144  AST 41  ALT 17  ALKPHOS 88  BILITOT 2.4*  PROT 6.2*  ALBUMIN 3.3*   No results for input(s): LIPASE, AMYLASE in the last 168 hours. No results for input(s): AMMONIA in the last 168 hours. Coagulation  profile Recent Labs  Lab 03/07/20 2144  INR 1.2    CBC: Recent Labs  Lab 03/07/20 2144 03/07/20 2154 03/09/20 0702 03/10/20 0258 03/11/20 0613  WBC 3.0*  --  2.5* 2.4* 2.7*  2.7*  NEUTROABS 2.1  --   --   --  1.8  HGB 9.5* 9.9* 7.8* 7.8* 8.0*  7.9*  HCT 27.2* 29.0* 23.3* 23.5* 24.1*  23.8*  MCV 110.6*  --  117.1* 117.5* 115.9*  114.4*  PLT 163  --  122* 119* 143*  136*   Cardiac Enzymes:  No results for input(s): CKTOTAL, CKMB, CKMBINDEX, TROPONINI in the last 168 hours. BNP (last 3 results) No results for input(s): PROBNP in the last 8760 hours. CBG: Recent Labs  Lab 03/07/20 2141 03/08/20 0030 03/08/20 0153 03/08/20 0545 03/08/20 1829  GLUCAP 61* 58* 88 76 124*   D-Dimer: No results for input(s): DDIMER in the last 72 hours. Hgb A1c: No results for input(s): HGBA1C in the last 72 hours. Lipid Profile: No results for input(s): CHOL, HDL, LDLCALC, TRIG, CHOLHDL, LDLDIRECT in the last 72 hours. Thyroid function studies: No results for input(s): TSH, T4TOTAL, T3FREE, THYROIDAB in the last 72 hours.  Invalid input(s): FREET3 Anemia work up: No results for input(s): VITAMINB12, FOLATE, FERRITIN, TIBC, IRON, RETICCTPCT in the last 72 hours. Sepsis Labs: Recent Labs  Lab 03/07/20 2144 03/09/20 0702 03/10/20 0258 03/11/20 0613  WBC 3.0* 2.5* 2.4* 2.7*  2.7*    Microbiology Recent Results (from the past 240 hour(s))  Urine culture     Status: Abnormal   Collection Time: 03/08/20 12:45 AM   Specimen: Urine, Random  Result Value Ref Range Status   Specimen Description URINE, RANDOM  Final   Special Requests   Final    NONE Performed at [facility], [address] [place], [place] [address]    Culture (A)  Final    60,000 COLONIES/mL MULTIPLE SPECIES PRESENT, SUGGEST RECOLLECTION   Report Status 03/09/2020 FINAL  Final  SARS Coronavirus 2 by RT PCR (hospital order, performed in [facility] hospital lab) Nasopharyngeal Nasopharyngeal Swab      Status: None   Collection Time: 03/08/20  1:00 AM   Specimen: Nasopharyngeal Swab  Result Value Ref Range Status   SARS Coronavirus 2 NEGATIVE NEGATIVE Final    Comment: (NOTE) SARS-CoV-2 target nucleic acids are NOT DETECTED.  The SARS-CoV-2 RNA is generally detectable in upper and lower respiratory specimens during the acute phase of infection. The lowest concentration of SARS-CoV-2 viral copies this assay can detect is 250 copies / mL. A negative result does not preclude SARS-CoV-2 infection and should not be used as the sole basis for treatment or other patient management decisions.  A negative result may occur with improper specimen collection / handling, submission of specimen other than nasopharyngeal swab, presence of viral mutation(s) within the areas targeted by this assay, and inadequate number of viral copies (<250 copies / mL). A negative result must be combined with clinical observations, patient history, and epidemiological information.  Fact Sheet for Patients:   BoilerBrush.com.cyhttps://www.fda.gov/media/136312/download  Fact Sheet for Healthcare Providers: https://pope.com/https://www.fda.gov/media/136313/download  This test is not yet approved or  cleared by the [place] FDA and has been authorized for detection and/or diagnosis of SARS-CoV-2 by FDA under an Emergency Use Authorization (EUA).  This EUA will remain in effect (meaning this test can be used) for the duration of the COVID-19 declaration under Section 564(b)(1) of the Act, 21 U.S.C. section 360bbb-3(b)(1), unless the authorization is terminated or revoked sooner.  Performed at [facility], [address] [place], [place] [address]   Culture, Urine     Status: Abnormal   Collection Time: 03/10/20  5:28 PM   Specimen: Urine, Clean Catch  Result Value Ref Range Status   Specimen Description URINE, CLEAN CATCH  Final   Special Requests   Final    NONE Performed at [facility], [address] [place],  [place] [address]    Culture MULTIPLE SPECIES PRESENT, SUGGEST RECOLLECTION (A)  Final  Report Status 03/11/2020 FINAL  Final    Procedures and diagnostic studies:  CT ABDOMEN PELVIS WO CONTRAST  Result Date: 03/10/2020 CLINICAL DATA:  Abdominal pain.  Evaluate for obstruction EXAM: CT ABDOMEN AND PELVIS WITHOUT CONTRAST TECHNIQUE: Multidetector CT imaging of the abdomen and pelvis was performed following the standard protocol without IV contrast. COMPARISON:  Plain film of earlier today. Most recent CT 10/22/2018 from [facility] FINDINGS: Lower chest: Emphysema. A 7 mm right lower lobe pulmonary nodule is similar back to 2017 and considered benign. Left base scarring. Mild cardiomegaly. Multivessel coronary artery atherosclerosis. Hepatobiliary: Normal noncontrast appearance of the liver. Normal gallbladder, without biliary ductal dilatation. Pancreas: Normal, without mass or ductal dilatation. Spleen: Normal in size, without focal abnormality. Adrenals/Urinary Tract: Normal adrenal glands. No renal calculi or hydronephrosis. Upper pole left renal low-density lesion is likely a cyst. There is a lower pole left renal 1.3 cm lesion which is similar in size on the prior, greater than fluid density on 31/3. An interpolar right renal 1.3 cm lesion is also similar in size to on the prior exam, slightly greater than fluid density and favored to represent a minimally complex cyst. No bladder calculi. Stomach/Bowel: Normal stomach, without wall thickening. Fluid-filled colon, suggesting a diarrheal state. Suspect surgical sutures within the sigmoid. Gas-filled colon of up to 5.6 cm versus 7.3 cm on the prior CT. The cecum extends into the central pelvis. Normal caliber of small bowel loops. Vascular/Lymphatic: Advanced aortic and branch vessel atherosclerosis. IVC filter. No abdominopelvic adenopathy. Reproductive: Hysterectomy versus uterine atrophy.  No adnexal mass. Other: Moderate pelvic floor laxity. No free  pelvic fluid. Trace perihepatic ascites. No free intraperitoneal air. Anasarca. Musculoskeletal: Osteopenia. Convex right lumbar spine curvature. Superior endplate L1 compression deformity without ventral canal encroachment. IMPRESSION: 1. Diffuse colonic distension, suggesting colonic ileus. Fluid-filled colon may represent a concurrent diarrheal state. No findings of obstruction. 2. Coronary artery atherosclerosis. Aortic Atherosclerosis (ICD10-I70.0). 3. Bilateral renal lesions. Technically indeterminate lesions which are unchanged in size, most likely complex cysts. If not already evaluated, these could be characterized with pre and post contrast abdominal CT or MRI at 6-12 months. 4. Trace perihepatic ascites. Electronically Signed   By: Jeronimo Greaves M.D.   On: 03/10/2020 18:52   DG Abd 1 View  Result Date: 03/12/2020 CLINICAL DATA:  Abdominal pain and distension EXAM: ABDOMEN - 1 VIEW COMPARISON:  March 10, 2020 abdominal radiograph and CT abdomen and pelvis FINDINGS: There remains generalized colonic dilatation, similar in appearance to recent examinations. No appreciable small bowel dilatation. No appreciable air-fluid levels on supine examination. No free air is appreciable on supine examination. Filter noted in inferior vena cava. There is lumbar dextroscoliosis. IMPRESSION: Colonic dilatation remains, similar in appearance compared to recent studies. No site of focal bowel obstruction evident by radiography. No appreciable free air on supine examination. Filter in inferior vena cava. Electronically Signed   By: Bretta Bang III M.D.   On: 03/12/2020 13:22    Medications:   . clopidogrel  75 mg Oral Daily  . diltiazem  180 mg Oral Daily  . doxycycline  100 mg Oral Q12H  . folic acid  1 mg Oral BID  . hydrocortisone  15 mg Oral Daily   And  . [START ON 03/13/2020] hydrocortisone  10 mg Oral Daily  . multivitamin with minerals  1 tablet Oral Daily  . oxybutynin  5 mg Oral QHS  .  pantoprazole  40 mg Oral BID  . sodium chloride  flush  3 mL Intravenous Q12H  . thiamine  100 mg Oral Daily   Or  . thiamine  100 mg Intravenous Daily   Continuous Infusions: . sodium chloride    . sodium chloride 75 mL/hr at 03/12/20 0351     LOS: 3 days   Joseph Art  [facility]   How to contact the [facility] Attending or Consulting provider 7A - 7P or covering provider during after hours 7P -7A, for this patient?  1. Check the care team in [facility] and look for a) attending/consulting [facility] provider listed and b) the [facility] team listed 2. Log into [email or web address] and use [facility]'s universal password to access. If you do not have the password, please contact the hospital operator. 3. Locate the [facility] provider you are looking for under [facility] and page to a number that you can be directly reached. 4. If you still have difficulty reaching the provider, please page the [facility] (Director on Call) for the Hospitalists listed on amion for assistance.  03/12/2020, 1:39 PM

## 2020-03-13 LAB — BASIC METABOLIC PANEL
Anion gap: 7 (ref 5–15)
BUN: 7 mg/dL — ABNORMAL LOW (ref 8–23)
CO2: 24 mmol/L (ref 22–32)
Calcium: 8 mg/dL — ABNORMAL LOW (ref 8.9–10.3)
Chloride: 99 mmol/L (ref 98–111)
Creatinine, Ser: 0.5 mg/dL (ref 0.44–1.00)
GFR calc Af Amer: >60 mL/min (ref >60)
GFR calc non Af Amer: >60 mL/min (ref >60)
Glucose, Bld: 93 mg/dL (ref 70–99)
Potassium: 3.3 mmol/L — ABNORMAL LOW (ref 3.5–5.1)
Sodium: 130 mmol/L — ABNORMAL LOW (ref 135–145)

## 2020-03-13 LAB — VITAMIN B12: Vitamin B-12: 1521 pg/mL — ABNORMAL HIGH (ref 180–914)

## 2020-03-13 LAB — CBC
HCT: 22.8 % — ABNORMAL LOW (ref 36.0–46.0)
Hemoglobin: 7.5 g/dL — ABNORMAL LOW (ref 12.0–15.0)
MCH: 37.7 pg — ABNORMAL HIGH (ref 26.0–34.0)
MCHC: 32.9 g/dL (ref 30.0–36.0)
MCV: 114.6 fL — ABNORMAL HIGH (ref 80.0–100.0)
Platelets: 135 10*3/uL — ABNORMAL LOW (ref 150–400)
RBC: 1.99 MIL/uL — ABNORMAL LOW (ref 3.87–5.11)
RDW: 14.2 % (ref 11.5–15.5)
WBC: 1.9 10*3/uL — ABNORMAL LOW (ref 4.0–10.5)
nRBC: 0 % (ref 0.0–0.2)

## 2020-03-13 LAB — PATHOLOGIST SMEAR REVIEW

## 2020-03-13 MED ORDER — POTASSIUM CHLORIDE 20 MEQ PO PACK
40.0000 meq | PACK | Freq: Once | ORAL | Status: AC
  Administered 2020-03-13: 40 meq via ORAL
  Filled 2020-03-13: qty 2

## 2020-03-13 MED ORDER — MAGNESIUM SULFATE 2 GM/50ML IV SOLN
2.0000 g | Freq: Once | INTRAVENOUS | Status: AC
  Administered 2020-03-13: 2 g via INTRAVENOUS
  Filled 2020-03-13: qty 50

## 2020-03-13 MED ORDER — RISAQUAD PO CAPS
1.0000 | ORAL_CAPSULE | Freq: Every day | ORAL | Status: DC
  Administered 2020-03-13 – 2020-03-20 (×9): 1 via ORAL
  Filled 2020-03-13 (×8): qty 1

## 2020-03-13 MED ORDER — CHOLESTYRAMINE 4 G PO PACK: 4.0000 g | PACK | Freq: Two times a day (BID) | ORAL | Status: DC

## 2020-03-13 NOTE — Progress Notes (Signed)
Progress Note    [name]  VPX:[id number] DOB: 1945/11/01  DOA: 03/07/2020 PCP: Malka So., MD    Brief Narrative:   Medical records reviewed and are as summarized below:  Beverly Lawrence is an 74 y.o. female with past medical history significant for PAF, HTN, chronic loose stools at 2-3 per day, SIADH, hemolytic anemia, h/o GI bleed 2 years ago. Patient presented secondary to left sided weakness and admitted for concern for TIA. Patient found to have hypoglycemia in addition to hyponatremia. TIA workup initiated in addition to treatment for hyponatremia (likely hypovolemic on admission) and hypoglycemia.  Assessment/Plan:   Active Problems:   Hypertension   Autoimmune hemolytic anemia   TIA (transient ischemic attack)   Hypoglycemia without diagnosis of diabetes mellitus   (HFpEF) heart failure with preserved ejection fraction (HCC)   Hyponatremia   Ileus (HCC)   Hyponatremia Unsure of etiology; patient has a history of SIADH but also has recent history is of hyponatremia secondary to volume overload requiring Lasix. Patient has been treated as hypovolemic hyponatremia with IV fluids with improvement -continue to monitor  Chronic respiratory failure -continue 2L  Left sided weakness No stroke seen on CT head or MRI.  Neurology consulted for evaluation.  Possible TIA versus sequela from hypoglycemia or hyponatremia.  Neurology recommending to continue Plavix.  With patient's history of atrial fibrillation, patient would benefit from anticoagulation however this was previously discontinued secondary to history of autoimmune hemolytic disease in addition to GI bleeding.  Hypoglycemia  Hemoglobin A1c obtained and is undetectable at less than 3.5%.  ? related to possible adrenal disease vs liver disease  Abnormal ACTH stimulation test It does not appear this was performed correctly  -Repeat ACTH stimulation test/ACTH pending; obtained 9/7 -patient clinically looks  like adrenal insuff so will treat with cortef for now (had been on high dose steroids in past 20 mg TID but per [facility] was no longer taking-- says she was on for 2 years)  Pancytopenia ITP Autoimmune hemolytic anemia -? Pancytopenia being Liver related -uses alcohol daily  (3-4 glasses of wine)  Chronic diastolic heart failure Recent echocardiogram from 9/4 significant for an EF of 60 to 65% with grade 1 diastolic dysfunction.  Currently not in heart failure.  Currently not fluid overloaded.  Ileus -  Patient has a history of multiple abdominal surgeries in addition to previous bowel obstruction requiring surgical management. CT abdomen/pelvis significant for evidence of ileus. Some nausea but no vomiting -Watch symptoms closely for worsening ileus/development of SBO (high risk) -full liquids for now and monitor closely  Diarrhea Chronic issue. Per patient, symptoms are not out of the ordinary- says she take cholestyramine at home TID-- will start once we know ileus has resolved -NO imodium for now secondary to ileus  Dysuria Previous urine culture with multiple species.  Patient's urine looks tea colored in canister.  Most recent urinalysis not very remarkable except for small amounts of glucose in addition to small amount of ketones. Repeat urinalysis is unremarkable.  Tea colored urine Urinalysis unremarkable. Possibly urine mixed with stool as patient has had diarrhea.  Cellulitis- appears to be chronic venous status changes Present earlier in admission and treated empirically on Vancomycin with improvement. -Transitioned to doxycycline and d/c today  Hypokalemia -replete along with Mg  Family Communication/Anticipated D/C date and plan/Code Status   DVT prophylaxis: Lovenox ordered. Code Status: Full Code.  Disposition Plan: Status is: Inpatient  Remains inpatient appropriate because:IV treatments appropriate due  to intensity of illness or inability to take  PO   Dispo: The patient is from: Home              Anticipated d/c is to: Home              Anticipated d/c date is: 2 days              Patient currently is not medically stable to d/c.         Medical Consultants:    Neurology     Subjective:   Still with diarrhea and some abdominal pain  Objective:    Vitals:   03/13/20 0350 03/13/20 0739 03/13/20 0817 03/13/20 1148  BP: 131/73  126/79 123/83  Pulse: 67  75 81  Resp: 18 19 18 18   Temp: 98.4 F (36.9 C)  99 F (37.2 C) 98.9 F (37.2 C)  TempSrc: Oral  Oral Oral  SpO2: 99%  98% 98%  Weight: 63.2 kg     Height:        Intake/Output Summary (Last 24 hours) at 03/13/2020 1226 Last data filed at 03/13/2020 0341 Gross per 24 hour  Intake 1519.59 ml  Output 400 ml  Net 1119.59 ml   Filed Weights   03/11/20 0500 03/12/20 0348 03/13/20 0350  Weight: 57 kg 60.2 kg 63.2 kg    Exam:   General: Appearance:    Chronically ill female in no acute distress  GI: +BS-- high pitched, distended  Lungs:     Clear to auscultation bilaterally, respirations unlabored on 2L [place]  Heart:    Normal heart rate. Normal rhythm. No murmurs, rubs, or gallops.   MS:   All extremities are intact.   Neurologic:   Awake, alert, oriented x 3. No apparent focal neurological           defect.     Data Reviewed:   I have personally reviewed following labs and imaging studies:  Labs: Labs show the following:   Basic Metabolic Panel: Recent Labs  Lab 03/10/20 2029 03/10/20 2029 03/11/20 0613 03/11/20 0613 03/11/20 1513 03/11/20 1513 03/12/20 0256 03/13/20 0341  NA 125*  --  126*  --  127*  --  127* 130*  K 4.1   < > 3.9   < > 3.9   < > 3.7 3.3*  CL 92*  --  93*  --  93*  --  95* 99  CO2 25  --  26  --  25  --  28 24  GLUCOSE 99  --  97  --  113*  --  89 93  BUN 10  --  8  --  10  --  8 7*  CREATININE 0.50  --  0.52  --  0.57  --  0.49 0.50  CALCIUM 8.3*  --  8.3*  --  8.4*  --  8.1* 8.0*   < > = values in this interval  not displayed.   GFR Estimated Creatinine Clearance: 54.4 mL/min (by C-G formula based on SCr of 0.5 mg/dL). Liver Function Tests: Recent Labs  Lab 03/07/20 2144  AST 41  ALT 17  ALKPHOS 88  BILITOT 2.4*  PROT 6.2*  ALBUMIN 3.3*   No results for input(s): LIPASE, AMYLASE in the last 168 hours. No results for input(s): AMMONIA in the last 168 hours. Coagulation profile Recent Labs  Lab 03/07/20 2144  INR 1.2    CBC: Recent Labs  Lab 03/07/20 2144 03/07/20 2144  03/07/20 2154 03/09/20 0702 03/10/20 0258 03/11/20 0613 03/13/20 0341  WBC 3.0*  --   --  2.5* 2.4* 2.7*  2.7* 1.9*  NEUTROABS 2.1  --   --   --   --  1.8  --   HGB 9.5*   < > 9.9* 7.8* 7.8* 8.0*  7.9* 7.5*  HCT 27.2*   < > 29.0* 23.3* 23.5* 24.1*  23.8* 22.8*  MCV 110.6*  --   --  117.1* 117.5* 115.9*  114.4* 114.6*  PLT 163  --   --  122* 119* 143*  136* 135*   < > = values in this interval not displayed.   Cardiac Enzymes: No results for input(s): CKTOTAL, CKMB, CKMBINDEX, TROPONINI in the last 168 hours. BNP (last 3 results) No results for input(s): PROBNP in the last 8760 hours. CBG: Recent Labs  Lab 03/07/20 2141 03/08/20 0030 03/08/20 0153 03/08/20 0545 03/08/20 1829  GLUCAP 61* 58* 88 76 124*   D-Dimer: No results for input(s): DDIMER in the last 72 hours. Hgb A1c: No results for input(s): HGBA1C in the last 72 hours. Lipid Profile: No results for input(s): CHOL, HDL, LDLCALC, TRIG, CHOLHDL, LDLDIRECT in the last 72 hours. Thyroid function studies: No results for input(s): TSH, T4TOTAL, T3FREE, THYROIDAB in the last 72 hours.  Invalid input(s): FREET3 Anemia work up: Recent Labs    03/13/20 0341  VITAMINB12 1,521*   Sepsis Labs: Recent Labs  Lab 03/09/20 0702 03/10/20 0258 03/11/20 0613 03/13/20 0341  WBC 2.5* 2.4* 2.7*  2.7* 1.9*    Microbiology Recent Results (from the past 240 hour(s))  Urine culture     Status: Abnormal   Collection Time: 03/08/20 12:45 AM    Specimen: Urine, Random  Result Value Ref Range Status   Specimen Description URINE, RANDOM  Final   Special Requests   Final    NONE Performed at [facility], [address] [place], [place] [address]    Culture (A)  Final    60,000 COLONIES/mL MULTIPLE SPECIES PRESENT, SUGGEST RECOLLECTION   Report Status 03/09/2020 FINAL  Final  SARS Coronavirus 2 by RT PCR (hospital order, performed in [facility] hospital lab) Nasopharyngeal Nasopharyngeal Swab     Status: None   Collection Time: 03/08/20  1:00 AM   Specimen: Nasopharyngeal Swab  Result Value Ref Range Status   SARS Coronavirus 2 NEGATIVE NEGATIVE Final    Comment: (NOTE) SARS-CoV-2 target nucleic acids are NOT DETECTED.  The SARS-CoV-2 RNA is generally detectable in upper and lower respiratory specimens during the acute phase of infection. The lowest concentration of SARS-CoV-2 viral copies this assay can detect is 250 copies / mL. A negative result does not preclude SARS-CoV-2 infection and should not be used as the sole basis for treatment or other patient management decisions.  A negative result may occur with improper specimen collection / handling, submission of specimen other than nasopharyngeal swab, presence of viral mutation(s) within the areas targeted by this assay, and inadequate number of viral copies (<250 copies / mL). A negative result must be combined with clinical observations, patient history, and epidemiological information.  Fact Sheet for Patients:   BoilerBrush.com.cyhttps://www.fda.gov/media/136312/download  Fact Sheet for Healthcare Providers: https://pope.com/https://www.fda.gov/media/136313/download  This test is not yet approved or  cleared by the [place] FDA and has been authorized for detection and/or diagnosis of SARS-CoV-2 by FDA under an Emergency Use Authorization (EUA).  This EUA will remain in effect (meaning this test can be used) for the  duration of the COVID-19 declaration under Section 564(b)(1) of the  Act, 21 U.S.C. section 360bbb-3(b)(1), unless the authorization is terminated or revoked sooner.  Performed at [facility], [address], [place], [place] [address]   Culture, Urine     Status: Abnormal   Collection Time: 03/10/20  5:28 PM   Specimen: Urine, Clean Catch  Result Value Ref Range Status   Specimen Description URINE, CLEAN CATCH  Final   Special Requests   Final    NONE Performed at [facility], [address] [place], [place] [address]    Culture MULTIPLE SPECIES PRESENT, SUGGEST RECOLLECTION (A)  Final   Report Status 03/11/2020 FINAL  Final    Procedures and diagnostic studies:  DG Abd 1 View  Result Date: 03/12/2020 CLINICAL DATA:  Abdominal pain and distension EXAM: ABDOMEN - 1 VIEW COMPARISON:  March 10, 2020 abdominal radiograph and CT abdomen and pelvis FINDINGS: There remains generalized colonic dilatation, similar in appearance to recent examinations. No appreciable small bowel dilatation. No appreciable air-fluid levels on supine examination. No free air is appreciable on supine examination. Filter noted in inferior vena cava. There is lumbar dextroscoliosis. IMPRESSION: Colonic dilatation remains, similar in appearance compared to recent studies. No site of focal bowel obstruction evident by radiography. No appreciable free air on supine examination. Filter in inferior vena cava. Electronically Signed   By: Bretta Bang III M.D.   On: 03/12/2020 13:22    Medications:   . clopidogrel  75 mg Oral Daily  . diltiazem  180 mg Oral Daily  . folic acid  1 mg Oral BID  . hydrocortisone  15 mg Oral Daily   And  . hydrocortisone  10 mg Oral Daily  . multivitamin with minerals  1 tablet Oral Daily  . oxybutynin  5 mg Oral QHS  . pantoprazole  40 mg Oral BID  . sodium chloride flush  3 mL Intravenous Q12H  . thiamine  100 mg Oral Daily   Or  . thiamine  100 mg Intravenous Daily   Continuous Infusions: . sodium chloride    . magnesium  sulfate bolus IVPB       LOS: 4 days   Joseph Art  [facility]   How to contact the [facility] Attending or Consulting provider 7A - 7P or covering provider during after hours 7P -7A, for this patient?  1. Check the care team in [facility] and look for a) attending/consulting TRH provider listed and b) the [facility] team listed 2. Log into [email or web address] and use [place]'s universal password to access. If you do not have the password, please contact the hospital operator. 3. Locate the [facility] provider you are looking for under [facility] and page to a number that you can be directly reached. 4. If you still have difficulty reaching the provider, please page the [facility] (Director on Call) for the Hospitalists listed on amion for assistance.  03/13/2020, 12:26 PM

## 2020-03-13 NOTE — Progress Notes (Signed)
Potasium 3.3, Dr. Leafy Half informed.

## 2020-03-13 NOTE — Progress Notes (Signed)
PT Cancellation Note  Patient Details Name: Beverly Lawrence MRN: [id number] DOB: December 08, 1945   Cancelled Treatment:    Reason Eval/Treat Not Completed: Patient declined, no reason specified. Reports nausea, hasn't eaten, declines PT at this time. Will re-attempt later as time allows.    Kaelah Hayashi 03/13/2020, 9:28 AM

## 2020-03-13 NOTE — TOC CAGE-AID Note (Signed)
Transition of Care [facility]) - CAGE-AID Screening   Patient Details  Name: Beverly Lawrence MRN: [id number] Date of Birth: 07/04/1946  [facility] CM/SW Contact:    Emeterio Reeve, [place] Phone Number: 03/13/2020, 4:14 PM   Clinical Narrative:  CSW met with pt at bedside. CSW introduced self and explained her role at the hospital.  Pt reports she drinks 2 glasses of wine most nights per week. Pt reports she will continue her current alcohol consumption. Pt denied alcohol use. Pt did not need resources at this time.   CAGE-AID Screening:    Have You Ever Felt You Ought to Cut Down on Your Drinking or Drug Use?: No Have People Annoyed You By Critizing Your Drinking Or Drug Use?: No Have You Felt Bad Or Guilty About Your Drinking Or Drug Use?: No Have You Ever Had a Drink or Used Drugs First Thing In The Morning to Steady Your Nerves or to Get Rid of a Hangover?: No CAGE-AID Score: 0  Substance Abuse Education Offered: Yes  Substance abuse interventions: Patient Counseling   Emeterio Reeve, Latanya Presser, [place] Social Worker [phone]

## 2020-03-13 NOTE — Plan of Care (Signed)

## 2020-03-13 NOTE — Progress Notes (Signed)
Physical Therapy Treatment Patient Details Name: Beverly Lawrence MRN: [id number] DOB: 11/29/1945 Today's Date: 03/13/2020    History of Present Illness 74 yo admitted with transient Left weakness with negative MRI and normal EEG with hyponatremia. PMHx: PAF, HTN, SIADH, GIB, diarrhea    PT Comments    Patient received in bed, initially declining PT completely, but then asks for assistance getting up to recliner. Patient performed bed mobility with mod independence. Transfers with supervision, performed stand pivot to recliner ( 4 steps). Cues for getting turned completely around to sit in recliner safely. She reports mild dizziness with oob activity. She will continue to benefit from skilled PT while here to improve functional mobility and safety for return home.     Follow Up Recommendations  Home health PT;Supervision/Assistance - 24 hour     Equipment Recommendations  None recommended by PT    Recommendations for Other Services       Precautions / Restrictions Precautions Precautions: Fall Restrictions Weight Bearing Restrictions: No    Mobility  Bed Mobility Overal bed mobility: Modified Independent             General bed mobility comments: no assist needed. able to get to sitting on edge of bed  Transfers Overall transfer level: Needs assistance Equipment used: Rolling walker (2 wheeled) Transfers: Sit to/from Stand Sit to Stand: Modified independent (Device/Increase time) Stand pivot transfers: Min guard       General transfer comment: min guard for stand pivot transfer from bed to recliner. She declines further ambulation at this time due to not feeling well today.  Ambulation/Gait Ambulation/Gait assistance: Min guard Gait Distance (Feet): 4 Feet Assistive device: Rolling walker (2 wheeled) Gait Pattern/deviations: Step-to pattern;Trunk flexed;Decreased stride length Gait velocity: WFL   General Gait Details: cues for safety with assist for lines and  O2   Stairs             Wheelchair Mobility    Modified Rankin (Stroke Patients Only)       Balance Overall balance assessment: History of Falls Sitting-balance support: Feet supported Sitting balance-Leahy Scale: Good     Standing balance support: Bilateral upper extremity supported;During functional activity Standing balance-Leahy Scale: Fair Standing balance comment: reliant on rw                            Cognition Arousal/Alertness: Awake/alert Behavior During Therapy: WFL for tasks assessed/performed Overall Cognitive Status: Within Functional Limits for tasks assessed Area of Impairment: Safety/judgement;Awareness                       Following Commands: Follows multi-step commands inconsistently;Follows one step commands consistently              Exercises      General Comments        Pertinent Vitals/Pain Pain Assessment: No/denies pain    Home Living                      Prior Function            PT Goals (current goals can now be found in the care plan section) Acute Rehab PT Goals Patient Stated Goal: return home PT Goal Formulation: With patient Time For Goal Achievement: 03/23/20 Potential to Achieve Goals: Fair Progress towards PT goals: Progressing toward goals    Frequency    Min 3X/week      PT Plan  Current plan remains appropriate    Co-evaluation              AM-PAC PT "6 Clicks" Mobility   Outcome Measure  Help needed turning from your back to your side while in a flat bed without using bedrails?: None Help needed moving from lying on your back to sitting on the side of a flat bed without using bedrails?: None Help needed moving to and from a bed to a chair (including a wheelchair)?: A Little Help needed standing up from a chair using your arms (e.g., wheelchair or bedside chair)?: A Little Help needed to walk in hospital room?: A Little Help needed climbing 3-5 steps with  a railing? : A Lot 6 Click Score: 19    End of Session Equipment Utilized During Treatment: Gait belt;Oxygen Activity Tolerance: Patient tolerated treatment well Patient left: in chair;with chair alarm set;with call bell/phone within reach;with family/visitor present Nurse Communication: Mobility status;Other (comment) (needs pure wick) PT Visit Diagnosis: Other abnormalities of gait and mobility (R26.89);Muscle weakness (generalized) (M62.81);Difficulty in walking, not elsewhere classified (R26.2)     Time: 0865-7846 PT Time Calculation (min) (ACUTE ONLY): 17 min  Charges:  $Therapeutic Activity: 8-22 mins                     Vanesa Renier, PT, GCS 03/13/20,1:42 PM

## 2020-03-14 LAB — COMPREHENSIVE METABOLIC PANEL
ALT: 14 U/L (ref 0–44)
AST: 24 U/L (ref 15–41)
Albumin: 2.6 g/dL — ABNORMAL LOW (ref 3.5–5.0)
Alkaline Phosphatase: 60 U/L (ref 38–126)
Anion gap: 8 (ref 5–15)
BUN: 8 mg/dL (ref 8–23)
CO2: 23 mmol/L (ref 22–32)
Calcium: 8.1 mg/dL — ABNORMAL LOW (ref 8.9–10.3)
Chloride: 99 mmol/L (ref 98–111)
Creatinine, Ser: 0.43 mg/dL — ABNORMAL LOW (ref 0.44–1.00)
GFR calc Af Amer: >60 mL/min (ref >60)
GFR calc non Af Amer: >60 mL/min (ref >60)
Glucose, Bld: 110 mg/dL — ABNORMAL HIGH (ref 70–99)
Potassium: 3.7 mmol/L (ref 3.5–5.1)
Sodium: 130 mmol/L — ABNORMAL LOW (ref 135–145)
Total Bilirubin: 1.3 mg/dL — ABNORMAL HIGH (ref 0.3–1.2)
Total Protein: 5.2 g/dL — ABNORMAL LOW (ref 6.5–8.1)

## 2020-03-14 LAB — CBC
HCT: 24.8 % — ABNORMAL LOW (ref 36.0–46.0)
Hemoglobin: 8 g/dL — ABNORMAL LOW (ref 12.0–15.0)
MCH: 37.6 pg — ABNORMAL HIGH (ref 26.0–34.0)
MCHC: 32.3 g/dL (ref 30.0–36.0)
MCV: 116.4 fL — ABNORMAL HIGH (ref 80.0–100.0)
Platelets: 142 10*3/uL — ABNORMAL LOW (ref 150–400)
RBC: 2.13 MIL/uL — ABNORMAL LOW (ref 3.87–5.11)
RDW: 14 % (ref 11.5–15.5)
WBC: 2.5 10*3/uL — ABNORMAL LOW (ref 4.0–10.5)
nRBC: 0 % (ref 0.0–0.2)

## 2020-03-14 MED ORDER — CHOLESTYRAMINE LIGHT 4 G PO PACK
4.0000 g | PACK | Freq: Every day | ORAL | Status: DC
  Administered 2020-03-14 – 2020-03-17 (×4): 4 g via ORAL
  Filled 2020-03-14 (×4): qty 1

## 2020-03-14 NOTE — Progress Notes (Signed)
Progress Note    SRAH AKE  ZOX:[id number] DOB: 1946-03-21  DOA: 03/07/2020 PCP: Malka So., MD    Brief Narrative:   Medical records reviewed and are as summarized below:  Beverly Lawrence is an 74 y.o. female with past medical history significant for PAF, HTN, chronic loose stools at 2-3 per day, SIADH, hemolytic anemia, h/o GI bleed 2 years ago. Patient presented secondary to left sided weakness and admitted for concern for TIA. Patient found to have hypoglycemia in addition to hyponatremia. TIA workup initiated in addition to treatment for hyponatremia (likely hypovolemic on admission) and hypoglycemia.  Assessment/Plan:   Active Problems:   Hypertension   Autoimmune hemolytic anemia   TIA (transient ischemic attack)   Hypoglycemia without diagnosis of diabetes mellitus   (HFpEF) heart failure with preserved ejection fraction (HCC)   Hyponatremia   Ileus (HCC)   Hyponatremia Unsure of etiology; patient has a history of SIADH but also has recent history is of hyponatremia secondary to volume overload requiring Lasix. Patient has been treated as hypovolemic hyponatremia with IV fluids with improvement/stability -continue to monitor  Chronic respiratory failure -continue 2L  Left sided weakness -resolved No stroke seen on CT head or MRI.  Neurology consulted for evaluation.  Possible TIA versus sequela from hypoglycemia or hyponatremia.  Neurology recommending to continue Plavix.  With patient's history of atrial fibrillation, patient would benefit from anticoagulation however this was previously discontinued secondary to history of autoimmune hemolytic disease in addition to GI bleeding.  Hypoglycemia  Hemoglobin A1c obtained and is undetectable at less than 3.5%.  ? related to possible adrenal disease vs liver disease  Abnormal ACTH stimulation test It does not appear this was performed correctly  -ACTH ordered: 57 but stim test not able to be done correctly  in the hospital -patient clinically looks like adrenal insuff so will treat with cortef for now (had been on high dose steroids in past 20 mg TID but per [facility] was no longer taking-- says she was on for 2 years)  Pancytopenia ITP Autoimmune hemolytic anemia -? Pancytopenia being Liver related -uses alcohol daily  (3-4 glasses of wine)  Chronic diastolic heart failure Recent echocardiogram from 9/4 significant for an EF of 60 to 65% with grade 1 diastolic dysfunction.  Currently not in heart failure.  Currently not fluid overloaded.  Ileus -  Patient has a history of multiple abdominal surgeries in addition to previous bowel obstruction requiring surgical management. CT abdomen/pelvis significant for evidence of ileus. Some nausea but no vomiting -Watch symptoms closely for worsening ileus/development of SBO (high risk) -abdomen softer, start home questran and advance diet  Diarrhea Chronic issue. Per patient, symptoms are not out of the ordinary- says she take cholestyramine at home TID-- start PO questran daily -NO imodium for now secondary to ileus  Dysuria Previous urine culture with multiple species.  Patient's urine looks tea colored in canister.  Most recent urinalysis not very remarkable except for small amounts of glucose in addition to small amount of ketones. Repeat urinalysis is unremarkable.  Tea colored urine Urinalysis unremarkable. Possibly urine mixed with stool as patient has had diarrhea.  Cellulitis- appears to be chronic venous status changes Present earlier in admission and treated empirically on Vancomycin with improvement. -Transitioned to doxycycline and d/c'd  Hypokalemia -replete along with Mg  Family Communication/Anticipated D/C date and plan/Code Status   DVT prophylaxis: Lovenox ordered. Code Status: Full Code.  Disposition Plan: Status is: Inpatient  Remains inpatient  appropriate because:IV treatments appropriate due to intensity of illness  or inability to take PO   Dispo: The patient is from: Home              Anticipated d/c is to: Home              Anticipated d/c date is: Monday              Patient currently is not medically stable to d/c.         Medical Consultants:    Neurology     Subjective:   Still with some diarrhea-- amount today has not been documented, she is eating well  Objective:    Vitals:   03/14/20 0444 03/14/20 0700 03/14/20 1145 03/14/20 1533  BP:  138/70 126/69 103/71  Pulse:  70 76 72  Resp:  18 18 18   Temp:  99.1 F (37.3 C) 98.8 F (37.1 C) 99.2 F (37.3 C)  TempSrc:  Oral Oral Oral  SpO2:  100% 100%   Weight: 63.7 kg     Height:        Intake/Output Summary (Last 24 hours) at 03/14/2020 1649 Last data filed at 03/14/2020 0353 Gross per 24 hour  Intake 0 ml  Output --  Net 0 ml   Filed Weights   03/12/20 0348 03/13/20 0350 03/14/20 0444  Weight: 60.2 kg 63.2 kg 63.7 kg    Exam:   General: Appearance:    Chronically ill appearing female in no acute distress   +BS, soft, less tender  Lungs:     On 2L [place], respirations unlabored  Heart:    Normal heart rate. Normal rhythm. No murmurs, rubs, or gallops.   MS:   All extremities are intact.   Neurologic:   Awake, alert, oriented x 3. No apparent focal neurological           defect.     Data Reviewed:   I have personally reviewed following labs and imaging studies:  Labs: Labs show the following:   Basic Metabolic Panel: Recent Labs  Lab 03/11/20 0613 03/11/20 0613 03/11/20 1513 03/11/20 1513 03/12/20 0256 03/12/20 0256 03/13/20 0341 03/14/20 0132  NA 126*  --  127*  --  127*  --  130* 130*  K 3.9   < > 3.9   < > 3.7   < > 3.3* 3.7  CL 93*  --  93*  --  95*  --  99 99  CO2 26  --  25  --  28  --  24 23  GLUCOSE 97  --  113*  --  89  --  93 110*  BUN 8  --  10  --  8  --  7* 8  CREATININE 0.52  --  0.57  --  0.49  --  0.50 0.43*  CALCIUM 8.3*  --  8.4*  --  8.1*  --  8.0* 8.1*   < > = values in  this interval not displayed.   GFR Estimated Creatinine Clearance: 54.6 mL/min (A) (by C-G formula based on SCr of 0.43 mg/dL (L)). Liver Function Tests: Recent Labs  Lab 03/07/20 2144 03/14/20 0132  AST 41 24  ALT 17 14  ALKPHOS 88 60  BILITOT 2.4* 1.3*  PROT 6.2* 5.2*  ALBUMIN 3.3* 2.6*   No results for input(s): LIPASE, AMYLASE in the last 168 hours. No results for input(s): AMMONIA in the last 168 hours. Coagulation profile Recent Labs  Lab  03/07/20 2144  INR 1.2    CBC: Recent Labs  Lab 03/07/20 2144 03/07/20 2154 03/09/20 0702 03/10/20 0258 03/11/20 0613 03/13/20 0341 03/14/20 0132  WBC 3.0*   < > 2.5* 2.4* 2.7*  2.7* 1.9* 2.5*  NEUTROABS 2.1  --   --   --  1.8  --   --   HGB 9.5*   < > 7.8* 7.8* 8.0*  7.9* 7.5* 8.0*  HCT 27.2*   < > 23.3* 23.5* 24.1*  23.8* 22.8* 24.8*  MCV 110.6*   < > 117.1* 117.5* 115.9*  114.4* 114.6* 116.4*  PLT 163   < > 122* 119* 143*  136* 135* 142*   < > = values in this interval not displayed.   Cardiac Enzymes: No results for input(s): CKTOTAL, CKMB, CKMBINDEX, TROPONINI in the last 168 hours. BNP (last 3 results) No results for input(s): PROBNP in the last 8760 hours. CBG: Recent Labs  Lab 03/07/20 2141 03/08/20 0030 03/08/20 0153 03/08/20 0545 03/08/20 1829  GLUCAP 61* 58* 88 76 124*   D-Dimer: No results for input(s): DDIMER in the last 72 hours. Hgb A1c: No results for input(s): HGBA1C in the last 72 hours. Lipid Profile: No results for input(s): CHOL, HDL, LDLCALC, TRIG, CHOLHDL, LDLDIRECT in the last 72 hours. Thyroid function studies: No results for input(s): TSH, T4TOTAL, T3FREE, THYROIDAB in the last 72 hours.  Invalid input(s): FREET3 Anemia work up: Recent Labs    03/13/20 0341  VITAMINB12 1,521*   Sepsis Labs: Recent Labs  Lab 03/10/20 0258 03/11/20 0613 03/13/20 0341 03/14/20 0132  WBC 2.4* 2.7*  2.7* 1.9* 2.5*    Microbiology Recent Results (from the past 240 hour(s))  Urine  culture     Status: Abnormal   Collection Time: 03/08/20 12:45 AM   Specimen: Urine, Random  Result Value Ref Range Status   Specimen Description URINE, RANDOM  Final   Special Requests   Final    NONE Performed at [facility], [address], [place], [place] [address]    Culture (A)  Final    60,000 COLONIES/mL MULTIPLE SPECIES PRESENT, SUGGEST RECOLLECTION   Report Status 03/09/2020 FINAL  Final  SARS Coronavirus 2 by RT PCR (hospital order, performed in [facility] hospital lab) Nasopharyngeal Nasopharyngeal Swab     Status: None   Collection Time: 03/08/20  1:00 AM   Specimen: Nasopharyngeal Swab  Result Value Ref Range Status   SARS Coronavirus 2 NEGATIVE NEGATIVE Final    Comment: (NOTE) SARS-CoV-2 target nucleic acids are NOT DETECTED.  The SARS-CoV-2 RNA is generally detectable in upper and lower respiratory specimens during the acute phase of infection. The lowest concentration of SARS-CoV-2 viral copies this assay can detect is 250 copies / mL. A negative result does not preclude SARS-CoV-2 infection and should not be used as the sole basis for treatment or other patient management decisions.  A negative result may occur with improper specimen collection / handling, submission of specimen other than nasopharyngeal swab, presence of viral mutation(s) within the areas targeted by this assay, and inadequate number of viral copies (<250 copies / mL). A negative result must be combined with clinical observations, patient history, and epidemiological information.  Fact Sheet for Patients:   BoilerBrush.com.cy  Fact Sheet for Healthcare Providers: https://pope.com/  This test is not yet approved or  cleared by the [place] FDA and has been authorized for detection and/or diagnosis of SARS-CoV-2 by FDA under an Emergency Use Authorization (EUA).  This EUA will remain in effect (meaning this test can be used) for  the duration of the COVID-19 declaration under Section 564(b)(1) of the Act, 21 U.S.C. section 360bbb-3(b)(1), unless the authorization is terminated or revoked sooner.  Performed at [facility], [address], [place], [place] [address]   Culture, Urine     Status: Abnormal   Collection Time: 03/10/20  5:28 PM   Specimen: Urine, Clean Catch  Result Value Ref Range Status   Specimen Description URINE, CLEAN CATCH  Final   Special Requests   Final    NONE Performed at [facility], [address], [place], [place] [address]    Culture MULTIPLE SPECIES PRESENT, SUGGEST RECOLLECTION (A)  Final   Report Status 03/11/2020 FINAL  Final    Procedures and diagnostic studies:  No results found.  Medications:   . acidophilus  1 capsule Oral Daily  . cholestyramine light  4 g Oral Daily  . clopidogrel  75 mg Oral Daily  . diltiazem  180 mg Oral Daily  . folic acid  1 mg Oral BID  . hydrocortisone  15 mg Oral Daily   And  . hydrocortisone  10 mg Oral Daily  . multivitamin with minerals  1 tablet Oral Daily  . oxybutynin  5 mg Oral QHS  . pantoprazole  40 mg Oral BID  . sodium chloride flush  3 mL Intravenous Q12H  . thiamine  100 mg Oral Daily   Or  . thiamine  100 mg Intravenous Daily   Continuous Infusions: . sodium chloride       LOS: 5 days   Joseph Art  [facility]   How to contact the [facility] Attending or Consulting provider 7A - 7P or covering provider during after hours 7P -7A, for this patient?  1. Check the care team in [facility] and look for a) attending/consulting [facility] provider listed and b) the [facility] team listed 2. Log into [email or web address] and use [place]'s universal password to access. If you do not have the password, please contact the hospital operator. 3. Locate the [facility] provider you are looking for under [facility] and page to a number that you can be directly reached. 4. If you still have difficulty reaching the provider, please  page the [facility] (Director on Call) for the Hospitalists listed on amion for assistance.  03/14/2020, 4:49 PM

## 2020-03-14 NOTE — Progress Notes (Signed)
Physical Therapy Treatment Patient Details Name: Beverly Lawrence MRN: [id number] DOB: 07-21-1945 Today's Date: 03/14/2020    History of Present Illness 74 yo admitted with transient Left weakness with negative MRI and normal EEG with hyponatremia. PMHx: PAF, HTN, SIADH, GIB, diarrhea    PT Comments    Patient received in bed, reports she just walked, so initially declines PT. But with encouragement she agrees to get up to the chair and then agrees to walk in the room. She requires no physical assist with bed mobility and transfers with supervision. She ambulated 25 feet with RW and min guard. No lob but reports L LE weakness. She will continue to benefit from skilled PT while here to improve functional independence and strength.     Follow Up Recommendations  Home health PT;Supervision/Assistance - 24 hour     Equipment Recommendations  None recommended by PT    Recommendations for Other Services       Precautions / Restrictions Precautions Precautions: Fall Restrictions Weight Bearing Restrictions: No    Mobility  Bed Mobility Overal bed mobility: Modified Independent             General bed mobility comments: no assist needed. able to get to sitting on edge of bed  Transfers Overall transfer level: Needs assistance Equipment used: Rolling walker (2 wheeled) Transfers: Sit to/from Stand   Stand pivot transfers: Supervision       General transfer comment: supervision for sit to stand transfer from bed  Ambulation/Gait Ambulation/Gait assistance: Min guard Gait Distance (Feet): 25 Feet Assistive device: Rolling walker (2 wheeled) Gait Pattern/deviations: Step-through pattern;Decreased stride length;Trunk flexed Gait velocity: WFL   General Gait Details: Reports L LE weakness with mobility, but no lob   Stairs             Wheelchair Mobility    Modified Rankin (Stroke Patients Only)       Balance Overall balance assessment: History of  Falls Sitting-balance support: Feet supported Sitting balance-Leahy Scale: Good     Standing balance support: Bilateral upper extremity supported;During functional activity Standing balance-Leahy Scale: Fair Standing balance comment: reliant on rw                            Cognition Arousal/Alertness: Awake/alert Behavior During Therapy: WFL for tasks assessed/performed Overall Cognitive Status: Within Functional Limits for tasks assessed                         Following Commands: Follows one step commands consistently              Exercises      General Comments  Patient's husband present and did not see her walk, so he was concerned about her returning home if she cant walk. After seeing her ambulate in room, he feels better about her returning home.       Pertinent Vitals/Pain Pain Assessment: No/denies pain    Home Living                      Prior Function            PT Goals (current goals can now be found in the care plan section) Acute Rehab PT Goals Patient Stated Goal: return home PT Goal Formulation: With patient Time For Goal Achievement: 03/23/20 Potential to Achieve Goals: Good Progress towards PT goals: Progressing toward goals    Frequency  Min 3X/week      PT Plan Current plan remains appropriate    Co-evaluation              AM-PAC PT "6 Clicks" Mobility   Outcome Measure  Help needed turning from your back to your side while in a flat bed without using bedrails?: None Help needed moving from lying on your back to sitting on the side of a flat bed without using bedrails?: None Help needed moving to and from a bed to a chair (including a wheelchair)?: A Little Help needed standing up from a chair using your arms (e.g., wheelchair or bedside chair)?: None Help needed to walk in hospital room?: A Little Help needed climbing 3-5 steps with a railing? : A Lot 6 Click Score: 20    End of Session  Equipment Utilized During Treatment: Gait belt;Oxygen Activity Tolerance: Patient tolerated treatment well Patient left: with call bell/phone within reach;in chair;with family/visitor present Nurse Communication: Mobility status PT Visit Diagnosis: Unsteadiness on feet (R26.81);Muscle weakness (generalized) (M62.81);Difficulty in walking, not elsewhere classified (R26.2)     Time: 2248-2500 PT Time Calculation (min) (ACUTE ONLY): 18 min  Charges:  $Gait Training: 8-22 mins                     Donta Mcinroy, PT, GCS 03/14/20,2:15 PM

## 2020-03-15 ENCOUNTER — Inpatient Hospital Stay (HOSPITAL_COMMUNITY): Payer: Medicare Other

## 2020-03-15 LAB — CREATININE, SERUM
Creatinine, Ser: 0.54 mg/dL (ref 0.44–1.00)
GFR calc Af Amer: >60 mL/min (ref >60)
GFR calc non Af Amer: >60 mL/min (ref >60)

## 2020-03-15 NOTE — Progress Notes (Signed)
Progress Note    Beverly BailiffRita M Bealer  WUJ:[id number] DOB: 1945/10/13  DOA: 03/07/2020 PCP: Malka SoJobe, Daniel B., MD    Brief Narrative:   Medical records reviewed and are as summarized below:  Beverly BailiffRita M Menor is an 74 y.o. female with past medical history significant for PAF, HTN, chronic loose stools at 2-3 per day, SIADH, hemolytic anemia, h/o GI bleed 2 years ago. Patient presented secondary to left sided weakness and admitted for concern for TIA. Patient found to have hypoglycemia in addition to hyponatremia. TIA workup initiated in addition to treatment for hyponatremia (likely hypovolemic on admission) and hypoglycemia.  Assessment/Plan:   Active Problems:   Hypertension   Autoimmune hemolytic anemia   TIA (transient ischemic attack)   Hypoglycemia without diagnosis of diabetes mellitus   (HFpEF) heart failure with preserved ejection fraction (HCC)   Hyponatremia   Ileus (HCC)   Hyponatremia Unsure of etiology; patient has a history of SIADH but also has recent history is of hyponatremia secondary to volume overload requiring Lasix. Patient has been treated as hypovolemic hyponatremia with IV fluids with improvement/stability -continue to monitor  Chronic respiratory failure -continue 2L  Left sided weakness -resolved No stroke seen on CT head or MRI.  Neurology consulted for evaluation.  Possible TIA versus sequela from hypoglycemia or hyponatremia.  Neurology recommending to continue Plavix.  With patient's history of atrial fibrillation, patient would benefit from anticoagulation however this was previously discontinued secondary to history of autoimmune hemolytic disease in addition to GI bleeding.  Hypoglycemia  Hemoglobin A1c obtained and is undetectable at less than 3.5%.  ? related to possible adrenal disease vs liver disease  Abnormal ACTH stimulation test It does not appear this was performed correctly  -ACTH ordered: 57 but stim test not able to be done correctly  in the hospital -patient clinically looks like adrenal insuff so will treat with cortef for now (had been on high dose steroids in past 20 mg TID but [facility]-- says she was on for 2 years)  Pancytopenia ITP Autoimmune hemolytic anemia -? Pancytopenia being Liver related -uses alcohol daily  (3-4 glasses of wine)  Chronic diastolic heart failure Recent echocardiogram from 9/4 significant for an EF of 60 to 65% with grade 1 diastolic dysfunction.  Currently not in heart failure.  Currently not fluid overloaded.  Ileus -  Patient has a history of multiple abdominal surgeries in addition to previous bowel obstruction requiring surgical management. CT abdomen/pelvis significant for evidence of ileus. Some nausea but no vomiting -Watch symptoms closely for worsening ileus/development of SBO (high risk) -bowel sounds present but high pitched  Diarrhea Chronic issue. Per patient, symptoms are not out of the ordinary- says she take cholestyramine at home TID-- questran daily for now -NO imodium for now secondary to ileus -episodes were not documented yesterday, will ak for special attention to be paid to # of BMs  Dysuria Previous urine culture with multiple species.  Patient's urine looks tea colored in canister.  Most recent urinalysis not very remarkable except for small amounts of glucose in addition to small amount of ketones. Repeat urinalysis is unremarkable.  Tea colored urine Urinalysis unremarkable. Possibly urine mixed with stool as patient has had diarrhea.  Cellulitis- appears to be chronic venous status changes Present earlier in admission and treated empirically on Vancomycin with improvement. -Transitioned to doxycycline and d/c'd  Hypokalemia -repleted along with Mg  Family Communication/Anticipated D/C date and plan/Code Status   DVT prophylaxis:  Lovenox ordered. Code Status: Full Code.  Disposition Plan: Status is: Inpatient  Remains  inpatient appropriate because:IV treatments appropriate due to intensity of illness or inability to take PO   Dispo: The patient is from: Home              Anticipated d/c is to: Home              Anticipated d/c date is: Monday              Patient currently is not medically stable to d/c.         Medical Consultants:    Neurology     Subjective:   Says she had 3 episodes of loose stools yesterday  Objective:    Vitals:   03/15/20 0308 03/15/20 0544 03/15/20 0905 03/15/20 1134  BP: (!) 145/83  118/75 127/73  Pulse: 73  64 78  Resp: 16  14 17   Temp: 98.5 F (36.9 C)  98.9 F (37.2 C) 98.4 F (36.9 C)  TempSrc: Oral  Oral Oral  SpO2: 100%  100% 100%  Weight:  62.4 kg    Height:        Intake/Output Summary (Last 24 hours) at 03/15/2020 1533 Last data filed at 03/15/2020 1000 Gross per 24 hour  Intake 60 ml  Output --  Net 60 ml   Filed Weights   03/13/20 0350 03/14/20 0444 03/15/20 0544  Weight: 63.2 kg 63.7 kg 62.4 kg    Exam:  General: Appearance:    Chronically ill appearing female in no acute distress  GI:  +BS (high pitched), soft, non-tender abd  Lungs:     On 2L, respirations unlabored  Heart:    Normal heart rate.   MS:   All extremities are intact. - chronic venous statis changes in LE  Neurologic:   Awake, alert, oriented x 3.      Data Reviewed:   I have personally reviewed following labs and imaging studies:  Labs: Labs show the following:   Basic Metabolic Panel: Recent Labs  Lab 03/11/20 0613 03/11/20 0613 03/11/20 1513 03/11/20 1513 03/12/20 0256 03/12/20 0256 03/13/20 0341 03/14/20 0132 03/15/20 0246  NA 126*  --  127*  --  127*  --  130* 130*  --   K 3.9   < > 3.9   < > 3.7   < > 3.3* 3.7  --   CL 93*  --  93*  --  95*  --  99 99  --   CO2 26  --  25  --  28  --  24 23  --   GLUCOSE 97  --  113*  --  89  --  93 110*  --   BUN 8  --  10  --  8  --  7* 8  --   CREATININE 0.52   < > 0.57  --  0.49  --  0.50 0.43*  0.54  CALCIUM 8.3*  --  8.4*  --  8.1*  --  8.0* 8.1*  --    < > = values in this interval not displayed.   GFR Estimated Creatinine Clearance: 50.3 mL/min (by C-G formula based on SCr of 0.54 mg/dL). Liver Function Tests: Recent Labs  Lab 03/14/20 0132  AST 24  ALT 14  ALKPHOS 60  BILITOT 1.3*  PROT 5.2*  ALBUMIN 2.6*   No results for input(s): LIPASE, AMYLASE in the last 168 hours. No results for input(s):  AMMONIA in the last 168 hours. Coagulation profile No results for input(s): INR, PROTIME in the last 168 hours.  CBC: Recent Labs  Lab 03/09/20 0702 03/10/20 0258 03/11/20 0613 03/13/20 0341 03/14/20 0132  WBC 2.5* 2.4* 2.7*  2.7* 1.9* 2.5*  NEUTROABS  --   --  1.8  --   --   HGB 7.8* 7.8* 8.0*  7.9* 7.5* 8.0*  HCT 23.3* 23.5* 24.1*  23.8* 22.8* 24.8*  MCV 117.1* 117.5* 115.9*  114.4* 114.6* 116.4*  PLT 122* 119* 143*  136* 135* 142*   Cardiac Enzymes: No results for input(s): CKTOTAL, CKMB, CKMBINDEX, TROPONINI in the last 168 hours. BNP (last 3 results) No results for input(s): PROBNP in the last 8760 hours. CBG: Recent Labs  Lab 03/08/20 1829  GLUCAP 124*   D-Dimer: No results for input(s): DDIMER in the last 72 hours. Hgb A1c: No results for input(s): HGBA1C in the last 72 hours. Lipid Profile: No results for input(s): CHOL, HDL, LDLCALC, TRIG, CHOLHDL, LDLDIRECT in the last 72 hours. Thyroid function studies: No results for input(s): TSH, T4TOTAL, T3FREE, THYROIDAB in the last 72 hours.  Invalid input(s): FREET3 Anemia work up: Recent Labs    03/13/20 0341  VITAMINB12 1,521*   Sepsis Labs: Recent Labs  Lab 03/10/20 0258 03/11/20 0613 03/13/20 0341 03/14/20 0132  WBC 2.4* 2.7*  2.7* 1.9* 2.5*    Microbiology Recent Results (from the past 240 hour(s))  Urine culture     Status: Abnormal   Collection Time: 03/08/20 12:45 AM   Specimen: Urine, Random  Result Value Ref Range Status   Specimen Description URINE, RANDOM  Final    Special Requests   Final    NONE Performed at [facility], [address], [place], [place] [address]    Culture (A)  Final    60,000 COLONIES/mL MULTIPLE SPECIES PRESENT, SUGGEST RECOLLECTION   Report Status 03/09/2020 FINAL  Final  SARS Coronavirus 2 by RT PCR (hospital order, performed in [facility] hospital lab) Nasopharyngeal Nasopharyngeal Swab     Status: None   Collection Time: 03/08/20  1:00 AM   Specimen: Nasopharyngeal Swab  Result Value Ref Range Status   SARS Coronavirus 2 NEGATIVE NEGATIVE Final    Comment: (NOTE) SARS-CoV-2 target nucleic acids are NOT DETECTED.  The SARS-CoV-2 RNA is generally detectable in upper and lower respiratory specimens during the acute phase of infection. The lowest concentration of SARS-CoV-2 viral copies this assay can detect is 250 copies / mL. A negative result does not preclude SARS-CoV-2 infection and should not be used as the sole basis for treatment or other patient management decisions.  A negative result may occur with improper specimen collection / handling, submission of specimen other than nasopharyngeal swab, presence of viral mutation(s) within the areas targeted by this assay, and inadequate number of viral copies (<250 copies / mL). A negative result must be combined with clinical observations, patient history, and epidemiological information.  Fact Sheet for Patients:   BoilerBrush.com.cy  Fact Sheet for Healthcare Providers: https://pope.com/  This test is not yet approved or  cleared by the [place] FDA and has been authorized for detection and/or diagnosis of SARS-CoV-2 by FDA under an Emergency Use Authorization (EUA).  This EUA will remain in effect (meaning this test can be used) for the duration of the COVID-19 declaration under Section 564(b)(1) of the Act, 21 U.S.C. section 360bbb-3(b)(1), unless the authorization is terminated or revoked  sooner.  Performed at [facility]  Lab, [address], [place], [place] [address]   Culture, Urine     Status: Abnormal   Collection Time: 03/10/20  5:28 PM   Specimen: Urine, Clean Catch  Result Value Ref Range Status   Specimen Description URINE, CLEAN CATCH  Final   Special Requests   Final    NONE Performed at [facility], [address], [place], [place] [address]    Culture MULTIPLE SPECIES PRESENT, SUGGEST RECOLLECTION (A)  Final   Report Status 03/11/2020 FINAL  Final    Procedures and diagnostic studies:  No results found.  Medications:   . acidophilus  1 capsule Oral Daily  . cholestyramine light  4 g Oral Daily  . clopidogrel  75 mg Oral Daily  . diltiazem  180 mg Oral Daily  . folic acid  1 mg Oral BID  . hydrocortisone  15 mg Oral Daily   And  . hydrocortisone  10 mg Oral Daily  . multivitamin with minerals  1 tablet Oral Daily  . oxybutynin  5 mg Oral QHS  . pantoprazole  40 mg Oral BID  . sodium chloride flush  3 mL Intravenous Q12H  . thiamine  100 mg Oral Daily   Or  . thiamine  100 mg Intravenous Daily   Continuous Infusions: . sodium chloride       LOS: 6 days   Joseph Art  [facility]   How to contact the [facility] Attending or Consulting provider 7A - 7P or covering provider during after hours 7P -7A, for this patient?  1. Check the care team in [facility] and look for a) attending/consulting [facility] provider listed and b) the [facility] team listed 2. Log into [email or web address] and use [facility]'s universal password to access. If you do not have the password, please contact the hospital operator. 3. Locate the [facility] provider you are looking for under [facility] and page to a number that you can be directly reached. 4. If you still have difficulty reaching the provider, please page the [facility] (Director on Call) for the Hospitalists listed on amion for assistance.  03/15/2020, 3:33 PM

## 2020-03-15 NOTE — Progress Notes (Signed)
PT Cancellation Note  Patient Details Name: Beverly Lawrence MRN: [id number] DOB: Jan 27, 1946   Cancelled Treatment:    Reason Eval/Treat Not Completed: Patient declined, no reason specified Refuses Physical Therapy today. Reports diarrhea and stomach discomfort. Apparently unmotivated yesterday but was at least willing to get in chair previously and appeared to do pretty well. Discussed importance of getting out of bed regularly to prevent secondary issues during admission.   Berton Mount 03/15/2020, 4:03 PM

## 2020-03-15 NOTE — Progress Notes (Signed)
Occupational Therapy Treatment Patient Details Name: Beverly Lawrence MRN: [id number] DOB: February 12, 1946 Today's Date: 03/15/2020    History of present illness 74 yo admitted with transient Left weakness with negative MRI and normal EEG with hyponatremia. PMHx: PAF, HTN, SIADH, GIB, diarrhea   OT comments  Pt inititally declined OT session but agreeable to addressing bed level pericare and bed mobility as she was soiled upon arrival of OT. Reports 7/10 left side abdominal soreness and filling "backed up." Nurse assessed during session. Rolled to each side for pericare. Discussed benefits of OOB activity and risks of immobility. Pt ultimately declined EOB/OOB even after further discussion during session. Discussed bed level exercises to gently encourage bowel motility. Encouraged OOB to recliner for dinner. Spouse present. D/c plan remains appropriate at this time.    Follow Up Recommendations  Home health OT;Supervision/Assistance - 24 hour    Equipment Recommendations  None recommended by OT    Recommendations for Other Services      Precautions / Restrictions Precautions Precautions: Fall Precaution Comments: watch sats Restrictions Weight Bearing Restrictions: No       Mobility Bed Mobility Overal bed mobility: Modified Independent                Transfers                      Balance                                           ADL either performed or assessed with clinical judgement   ADL Overall ADL's : Needs assistance/impaired                             Toileting- Clothing Manipulation and Hygiene: Moderate assistance;Bed level Toileting - Clothing Manipulation Details (indicate cue type and reason): pt rolled to reach side mod I. Assist for pericare.        General ADL Comments: Pt inititally declined OT but agreeable to addressing bed level pericare and bed mobility as she was soiled upon arrival of OT. Rolled to each  side for pericare. Discussed benefits of OOB activity and risks of immobility. Pt ultimately declined EOB/OOB. Discussed bed level exercises to gently encourage bowel motility. Encouraged OOB to recliner for dinner.      Vision       Perception     Praxis      Cognition Arousal/Alertness: Awake/alert Behavior During Therapy: Anxious;WFL for tasks assessed/performed Overall Cognitive Status: Within Functional Limits for tasks assessed                                 General Comments: Pt initially irritated at start of session, then expressed feeling anxious about abdominal bloating and soreness. Appears overwhelmed by current circumstance        Exercises     Shoulder Instructions       General Comments      Pertinent Vitals/ Pain       Pain Assessment: 0-10 Pain Score: 7  Pain Location: stomach Pain Descriptors / Indicators: Aching;Cramping;Discomfort Pain Intervention(s): Monitored during session;Limited activity within patient's tolerance;Repositioned (Nurse assessed abdomen during OT session)  Home Living  Prior Functioning/Environment              Frequency  Min 2X/week        Progress Toward Goals  OT Goals(current goals can now be found in the care plan section)  Progress towards OT goals: Progressing toward goals  Acute Rehab OT Goals Patient Stated Goal: return home Time For Goal Achievement: 03/29/20 Potential to Achieve Goals: Good ADL Goals Pt Will Perform Lower Body Bathing: with supervision;sit to/from stand Pt Will Perform Lower Body Dressing: with supervision;sit to/from stand Pt Will Transfer to Toilet: with modified independence;ambulating Pt Will Perform Toileting - Clothing Manipulation and hygiene: with modified independence;sit to/from stand;sitting/lateral leans Additional ADL Goal #1: Pt will independently verbalize 3 strateggies to reduce risk of falls   Plan Discharge plan remains appropriate    Co-evaluation                 AM-PAC OT "6 Clicks" Daily Activity     Outcome Measure   Help from another person eating meals?: A Little Help from another person taking care of personal grooming?: A Little Help from another person toileting, which includes using toliet, bedpan, or urinal?: A Little Help from another person bathing (including washing, rinsing, drying)?: A Lot Help from another person to put on and taking off regular upper body clothing?: A Little Help from another person to put on and taking off regular lower body clothing?: A Lot 6 Click Score: 16    End of Session    OT Visit Diagnosis: Unsteadiness on feet (R26.81);Muscle weakness (generalized) (M62.81);History of falling (Z91.81)   Activity Tolerance Patient limited by fatigue;Other (comment) (7/10 left side abdominal pain)   Patient Left in bed;with call bell/phone within reach;with bed alarm set;with family/visitor present   Nurse Communication          Time: 2426-8341 OT Time Calculation (min): 19 min  Charges: OT General Charges $OT Visit: 1 Visit OT Treatments $Self Care/Home Management : 8-22 mins  Raynald Kemp, OT Acute Rehabilitation Services Pager: [phone] Office: [phone]    Pilar Grammes 03/15/2020, 4:11 PM

## 2020-03-16 LAB — CBC
HCT: 25.6 % — ABNORMAL LOW (ref 36.0–46.0)
Hemoglobin: 8.4 g/dL — ABNORMAL LOW (ref 12.0–15.0)
MCH: 37.2 pg — ABNORMAL HIGH (ref 26.0–34.0)
MCHC: 32.8 g/dL (ref 30.0–36.0)
MCV: 113.3 fL — ABNORMAL HIGH (ref 80.0–100.0)
Platelets: 163 10*3/uL (ref 150–400)
RBC: 2.26 MIL/uL — ABNORMAL LOW (ref 3.87–5.11)
RDW: 13.5 % (ref 11.5–15.5)
WBC: 2.4 10*3/uL — ABNORMAL LOW (ref 4.0–10.5)
nRBC: 0 % (ref 0.0–0.2)

## 2020-03-16 LAB — BASIC METABOLIC PANEL
Anion gap: 7 (ref 5–15)
BUN: 6 mg/dL — ABNORMAL LOW (ref 8–23)
CO2: 26 mmol/L (ref 22–32)
Calcium: 8.2 mg/dL — ABNORMAL LOW (ref 8.9–10.3)
Chloride: 99 mmol/L (ref 98–111)
Creatinine, Ser: 0.52 mg/dL (ref 0.44–1.00)
GFR calc Af Amer: >60 mL/min (ref >60)
GFR calc non Af Amer: >60 mL/min (ref >60)
Glucose, Bld: 78 mg/dL (ref 70–99)
Potassium: 3.8 mmol/L (ref 3.5–5.1)
Sodium: 132 mmol/L — ABNORMAL LOW (ref 135–145)

## 2020-03-16 NOTE — Progress Notes (Signed)
Progress Note    [name]  RDE:[id number] DOB: [birth year]  DOA: 03/07/2020 PCP: Malka So., MD    Brief Narrative:   Medical records reviewed and are as summarized below:  Beverly Lawrence is an 74 y.o. female with past medical history significant for PAF, HTN, chronic loose stools at 2-3 per day, SIADH, hemolytic anemia, h/o GI bleed 2 years ago. Patient presented secondary to left sided weakness and admitted for concern for TIA. Patient found to have hypoglycemia in addition to hyponatremia. TIA workup initiated in addition to treatment for hyponatremia (likely hypovolemic on admission) and hypoglycemia.  Assessment/Plan:   Active Problems:   Hypertension   Autoimmune hemolytic anemia   TIA (transient ischemic attack)   Hypoglycemia without diagnosis of diabetes mellitus   (HFpEF) heart failure with preserved ejection fraction (HCC)   Hyponatremia   Ileus (HCC)   Hyponatremia Unsure of etiology; patient has a history of SIADH but also has recent history is of hyponatremia secondary to volume overload requiring Lasix. Patient has been treated as hypovolemic hyponatremia with IV fluids with improvement/stability -continue to monitor  Chronic respiratory failure -continue 2L  Left sided weakness -resolved No stroke seen on CT head or MRI.  Neurology consulted for evaluation.  Possible TIA versus sequela from hypoglycemia or hyponatremia.  Neurology recommending to continue Plavix.  With patient's history of atrial fibrillation, patient would benefit from anticoagulation however this was previously discontinued secondary to history of autoimmune hemolytic disease in addition to GI bleeding.  Hypoglycemia  Hemoglobin A1c obtained and is undetectable at less than 3.5%.  ? related to possible adrenal disease vs liver disease  Abnormal ACTH stimulation test It does not appear this was performed correctly  -ACTH ordered: 57 but stim test not able to be done correctly  in the hospital -patient clinically appears to be like adrenal insuff so will treat with cortef for now (had been on high dose steroids in past 20 mg TID but per [facility] was no longer taking-- says she was on for 2 years) -improved since steroids started  Pancytopenia ITP Autoimmune hemolytic anemia -? Pancytopenia being Liver related -uses alcohol daily  (3-4 glasses of wine)  Chronic diastolic heart failure Recent echocardiogram from 9/4 significant for an EF of 60 to 65% with grade 1 diastolic dysfunction.  Currently not in heart failure.  Currently not fluid overloaded.  Ileus -  Patient has a history of multiple abdominal surgeries in addition to previous bowel obstruction requiring surgical management. CT abdomen/pelvis significant for evidence of ileus. Some nausea but no vomiting -Watch symptoms closely for worsening ileus/development of SBO (high risk) -chart reviewed and patient has had colonic distention for years- colonoscopy in 11/2018 ([facility]) -bowel sounds present   Diarrhea Chronic issue. Per patient, symptoms are not out of the ordinary- says she take cholestyramine at home TID-- questran daily for now -NO imodium for now secondary to ileus -BMs slowly decreasing in #  Dysuria Previous urine culture with multiple species.  Patient's urine looks tea colored in canister.  Most recent urinalysis not very remarkable except for small amounts of glucose in addition to small amount of ketones. Repeat urinalysis is unremarkable.  Tea colored urine Urinalysis unremarkable. Possibly urine mixed with stool as patient has had diarrhea.  Cellulitis- appears to be chronic venous status changes Present earlier in admission and treated empirically on Vancomycin with improvement. -Transitioned to doxycycline and d/c'd  Hypokalemia -repleted along with Mg  Family Communication/Anticipated D/C  date and plan/Code Status   DVT prophylaxis: Lovenox ordered. Code  Status: Full Code.  Disposition Plan: Status is: Inpatient  Remains inpatient appropriate because:IV treatments appropriate due to intensity of illness or inability to take PO   Dispo: The patient is from: Home              Anticipated d/c is to: Home              Anticipated d/c date is: Monday              Patient currently is not medically stable to d/c.         Medical Consultants:    Neurology     Subjective:   Had some abdominal distention yesterday that go better with a large BM  Objective:    Vitals:   03/16/20 0336 03/16/20 0500 03/16/20 0809 03/16/20 1108  BP: 137/71  127/80 116/64  Pulse: 81  66 73  Resp: 16  18 16   Temp: (!) 97.5 F (36.4 C)  98.4 F (36.9 C) 98.2 F (36.8 C)  TempSrc: Oral  Oral Oral  SpO2: 100%  99% 100%  Weight:  64.4 kg    Height:        Intake/Output Summary (Last 24 hours) at 03/16/2020 1509 Last data filed at 03/16/2020 0336 Gross per 24 hour  Intake 240 ml  Output 600 ml  Net -360 ml   Filed Weights   03/14/20 0444 03/15/20 0544 03/16/20 0500  Weight: 63.7 kg 62.4 kg 64.4 kg    Exam:  General: Appearance:     Overweight female in no acute distress   +BS, distended, bowel sounds less high pitched  Lungs:     On  2L, respirations unlabored  Heart:    Normal heart rate.   MS:   All extremities are intact.   Neurologic:   Awake, alert, oriented x 3.       Data Reviewed:   I have personally reviewed following labs and imaging studies:  Labs: Labs show the following:   Basic Metabolic Panel: Recent Labs  Lab 03/11/20 1513 03/11/20 1513 03/12/20 0256 03/12/20 0256 03/13/20 0341 03/13/20 0341 03/14/20 0132 03/15/20 0246 03/16/20 0652  NA 127*  --  127*  --  130*  --  130*  --  132*  K 3.9   < > 3.7   < > 3.3*   < > 3.7  --  3.8  CL 93*  --  95*  --  99  --  99  --  99  CO2 25  --  28  --  24  --  23  --  26  GLUCOSE 113*  --  89  --  93  --  110*  --  78  BUN 10  --  8  --  7*  --  8  --  6*    CREATININE 0.57   < > 0.49  --  0.50  --  0.43* 0.54 0.52  CALCIUM 8.4*  --  8.1*  --  8.0*  --  8.1*  --  8.2*   < > = values in this interval not displayed.   GFR Estimated Creatinine Clearance: 54.9 mL/min (by C-G formula based on SCr of 0.52 mg/dL). Liver Function Tests: Recent Labs  Lab 03/14/20 0132  AST 24  ALT 14  ALKPHOS 60  BILITOT 1.3*  PROT 5.2*  ALBUMIN 2.6*   No results for input(s): LIPASE, AMYLASE in  the last 168 hours. No results for input(s): AMMONIA in the last 168 hours. Coagulation profile No results for input(s): INR, PROTIME in the last 168 hours.  CBC: Recent Labs  Lab 03/10/20 0258 03/11/20 0613 03/13/20 0341 03/14/20 0132 03/16/20 0652  WBC 2.4* 2.7*  2.7* 1.9* 2.5* 2.4*  NEUTROABS  --  1.8  --   --   --   HGB 7.8* 8.0*  7.9* 7.5* 8.0* 8.4*  HCT 23.5* 24.1*  23.8* 22.8* 24.8* 25.6*  MCV 117.5* 115.9*  114.4* 114.6* 116.4* 113.3*  PLT 119* 143*  136* 135* 142* 163   Cardiac Enzymes: No results for input(s): CKTOTAL, CKMB, CKMBINDEX, TROPONINI in the last 168 hours. BNP (last 3 results) No results for input(s): PROBNP in the last 8760 hours. CBG: No results for input(s): GLUCAP in the last 168 hours. D-Dimer: No results for input(s): DDIMER in the last 72 hours. Hgb A1c: No results for input(s): HGBA1C in the last 72 hours. Lipid Profile: No results for input(s): CHOL, HDL, LDLCALC, TRIG, CHOLHDL, LDLDIRECT in the last 72 hours. Thyroid function studies: No results for input(s): TSH, T4TOTAL, T3FREE, THYROIDAB in the last 72 hours.  Invalid input(s): FREET3 Anemia work up: No results for input(s): VITAMINB12, FOLATE, FERRITIN, TIBC, IRON, RETICCTPCT in the last 72 hours. Sepsis Labs: Recent Labs  Lab 03/11/20 0613 03/13/20 0341 03/14/20 0132 03/16/20 0652  WBC 2.7*  2.7* 1.9* 2.5* 2.4*    Microbiology Recent Results (from the past 240 hour(s))  Urine culture     Status: Abnormal   Collection Time: 03/08/20 12:45 AM    Specimen: Urine, Random  Result Value Ref Range Status   Specimen Description URINE, RANDOM  Final   Special Requests   Final    NONE Performed at [facility], [address] [place], [place] [address]    Culture (A)  Final    60,000 COLONIES/mL MULTIPLE SPECIES PRESENT, SUGGEST RECOLLECTION   Report Status 03/09/2020 FINAL  Final  SARS Coronavirus 2 by RT PCR (hospital order, [facility]) Nasopharyngeal Nasopharyngeal Swab     Status: None   Collection Time: 03/08/20  1:00 AM   Specimen: Nasopharyngeal Swab  Result Value Ref Range Status   SARS Coronavirus 2 NEGATIVE NEGATIVE Final    Comment: (NOTE) SARS-CoV-2 target nucleic acids are NOT DETECTED.  The SARS-CoV-2 RNA is generally detectable in upper and lower respiratory specimens during the acute phase of infection. The lowest concentration of SARS-CoV-2 viral copies this assay can detect is 250 copies / mL. A negative result does not preclude SARS-CoV-2 infection and should not be used as the sole basis for treatment or other patient management decisions.  A negative result may occur with improper specimen collection / handling, submission of specimen other than nasopharyngeal swab, presence of viral mutation(s) within the areas targeted by this assay, and inadequate number of viral copies (<250 copies / mL). A negative result must be combined with clinical observations, patient history, and epidemiological information.  Fact Sheet for Patients:   BoilerBrush.com.cyhttps://www.fda.gov/media/136312/download  Fact Sheet for Healthcare Providers: https://pope.com/https://www.fda.gov/media/136313/download  This test is not yet approved or  cleared by the [place] FDA and has been authorized for detection and/or diagnosis of SARS-CoV-2 by FDA under an Emergency Use Authorization (EUA).  This EUA will remain in effect (meaning this test can be used) for the duration of the COVID-19 declaration under Section 564(b)(1) of the  Act, 21 U.S.C. section 360bbb-3(b)(1), unless the authorization is  terminated or revoked sooner.  Performed at [facility], [address], [place], [place] [address]   Culture, Urine     Status: Abnormal   Collection Time: 03/10/20  5:28 PM   Specimen: Urine, Clean Catch  Result Value Ref Range Status   Specimen Description URINE, CLEAN CATCH  Final   Special Requests   Final    NONE Performed at [facility], [address] [place], [place] [address]    Culture MULTIPLE SPECIES PRESENT, SUGGEST RECOLLECTION (A)  Final   Report Status 03/11/2020 FINAL  Final    Procedures and diagnostic studies:  DG Abd Portable 2V  Result Date: 03/15/2020 CLINICAL DATA:  Follow-up ileus. EXAM: X-RAY ABDOMEN 3 VIEWS COMPARISON:  March 12, 2020 FINDINGS: Diffuse generalized colonic dilatation is again seen. This is unchanged in appearance when compared to the prior study the visualized small bowel loops appear to be normal in caliber. There is no evidence of free air. An inferior vena cava filter is noted. No radio-opaque calculi or other significant radiographic abnormality is seen. There is marked severity dextroscoliosis of the lumbar spine. IMPRESSION: Findings suggestive of colonic ileus, unchanged in appearance when compared to the prior study. Electronically Signed   By: Aram Candela M.D.   On: 03/15/2020 19:37    Medications:   . acidophilus  1 capsule Oral Daily  . cholestyramine light  4 g Oral Daily  . clopidogrel  75 mg Oral Daily  . diltiazem  180 mg Oral Daily  . folic acid  1 mg Oral BID  . hydrocortisone  15 mg Oral Daily   And  . hydrocortisone  10 mg Oral Daily  . multivitamin with minerals  1 tablet Oral Daily  . oxybutynin  5 mg Oral QHS  . pantoprazole  40 mg Oral BID  . sodium chloride flush  3 mL Intravenous Q12H  . thiamine  100 mg Oral Daily   Or  . thiamine  100 mg Intravenous Daily   Continuous Infusions: . sodium chloride       LOS: 7  days   Joseph Art  [facility]   How to contact the [facility] Attending or Consulting provider 7A - 7P or covering provider during after hours 7P -7A, for this patient?  1. Check the care team in [facility] and look for a) attending/consulting [facility] provider listed and b) the [facility] team listed 2. Log into [email or web address] and use [facility] universal password to access. If you do not have the password, please contact the hospital operator. 3. Locate the [facility] provider you are looking for under [facility] and page to a number that you can be directly reached. 4. If you still have difficulty reaching the provider, please page the [facility] (Director on Call) for the Hospitalists listed on amion for assistance.  03/16/2020, 3:09 PM

## 2020-03-17 ENCOUNTER — Inpatient Hospital Stay: Payer: Medicare Other

## 2020-03-17 DIAGNOSIS — Z23 Encounter for immunization: Secondary | ICD-10-CM

## 2020-03-17 LAB — IRON AND TIBC
Iron: 42 ug/dL (ref 28–170)
Saturation Ratios: 34 % — ABNORMAL HIGH (ref 10.4–31.8)
TIBC: 123 ug/dL — ABNORMAL LOW (ref 250–450)
UIBC: 81 ug/dL

## 2020-03-17 MED ORDER — HYDROCORTISONE 5 MG PO TABS
5.0000 mg | ORAL_TABLET | Freq: Every day | ORAL | Status: DC
  Administered 2020-03-17 – 2020-03-20 (×4): 5 mg via ORAL
  Filled 2020-03-17 (×4): qty 1

## 2020-03-17 MED ORDER — POLYSACCHARIDE IRON COMPLEX 150 MG PO CAPS
150.0000 mg | ORAL_CAPSULE | ORAL | Status: DC
  Administered 2020-03-19: 150 mg via ORAL
  Filled 2020-03-17 (×2): qty 1

## 2020-03-17 MED ORDER — CHOLESTYRAMINE LIGHT 4 G PO PACK
4.0000 g | PACK | Freq: Two times a day (BID) | ORAL | Status: DC
  Administered 2020-03-17 – 2020-03-20 (×6): 4 g via ORAL
  Filled 2020-03-17 (×7): qty 1

## 2020-03-17 MED ORDER — HYDROCORTISONE 10 MG PO TABS
10.0000 mg | ORAL_TABLET | Freq: Every day | ORAL | Status: DC
  Administered 2020-03-18 – 2020-03-20 (×3): 10 mg via ORAL
  Filled 2020-03-17 (×3): qty 1

## 2020-03-17 MED ORDER — POLYSACCHARIDE IRON COMPLEX 150 MG PO CAPS
150.0000 mg | ORAL_CAPSULE | Freq: Every day | ORAL | Status: DC
  Administered 2020-03-17: 150 mg via ORAL
  Filled 2020-03-17: qty 1

## 2020-03-17 NOTE — Progress Notes (Signed)
Progress Note    Beverly BailiffRita M Lawrence  OZH:[id number] DOB: 02/05/1946  DOA: 03/07/2020 PCP: Beverly Lawrence, Daniel B., MD    Brief Narrative:   Medical records reviewed and are as summarized below:  Beverly Lawrence is an 74 y.o. female with past medical history significant for PAF, HTN, chronic loose stools at 2-3 per day, SIADH, hemolytic anemia, h/o GI bleed 2 years ago. Patient presented secondary to left sided weakness and admitted for concern for TIA. Patient found to have hypoglycemia in addition to hyponatremia. TIA workup initiated in addition to treatment for hyponatremia (likely hypovolemic on admission) and hypoglycemia.  Assessment/Plan:   Active Problems:   Hypertension   Autoimmune hemolytic anemia   TIA (transient ischemic attack)   Hypoglycemia without diagnosis of diabetes mellitus   (HFpEF) heart failure with preserved ejection fraction (HCC)   Hyponatremia   Ileus (HCC)   Hyponatremia Unsure of etiology; patient has a history of SIADH but also has recent history is of hyponatremia secondary to volume overload requiring Lasix. Patient has been treated as hypovolemic hyponatremia with IV fluids with improvement/stability -continue to monitor  Chronic respiratory failure -continue 2L  Left sided weakness -resolved No stroke seen on CT head or MRI.  Neurology consulted for evaluation.  Possible TIA versus sequela from hypoglycemia or hyponatremia.  Neurology recommending to continue Plavix.  With patient's history of atrial fibrillation, patient would benefit from anticoagulation however this was previously discontinued secondary to history of autoimmune hemolytic disease in addition to GI bleeding.  Hypoglycemia  Hemoglobin A1c obtained and is undetectable at less than 3.5%.  ? related to possible adrenal disease vs liver disease -will need outpatient follow up  Abnormal ACTH stimulation test It does not appear this was performed correctly  -ACTH ordered but not sure of  timing: 57 but stim test not able to be done correctly in the hospital -patient clinically appears to be like adrenal insuff so will treat with cortef for now (had been on high dose steroids in past 20 mg [facility]ID [facility]-- says she was on for 2 years) -improved since steroids started- taper to lowest dose and then close outpatient follow up  Pancytopenia ITP Autoimmune hemolytic anemia -? Pancytopenia being Liver related -uses alcohol daily  (3-4 glasses of wine)  Chronic diastolic heart failure Recent echocardiogram from 9/4 significant for an EF of 60 to 65% with grade 1 diastolic dysfunction.  Currently not in heart failure.  Currently not fluid overloaded.  Ileus -  Patient has a history of multiple abdominal surgeries in addition to previous bowel obstruction requiring surgical management. CT abdomen/pelvis significant for evidence of ileus. Some nausea but no vomiting -Watch symptoms closely for worsening ileus/development of SBO (high risk) -chart reviewed and patient has had colonic distention for years- colonoscopy in 11/2018 ([facility]) -bowel sounds present   Diarrhea Chronic issue. Per patient, symptoms are not out of the ordinary- says she take cholestyramine at home TID-- questran BID for now -NO imodium for now secondary to ileus -add iron QOD -BMs slowly decreasing in #  Dysuria Previous urine culture with multiple species.  Patient's urine looks tea colored in canister.  Most recent urinalysis not very remarkable except for small amounts of glucose in addition to small amount of ketones. Repeat urinalysis is unremarkable.  Tea colored urine Urinalysis unremarkable. Possibly urine mixed with stool as patient has had diarrhea.  Cellulitis- appears to be chronic venous status changes Present earlier in  admission and treated empirically on Vancomycin with improvement. -Transitioned to doxycycline and d/c'd  Hypokalemia -repleted  along with Mg  Family Communication/Anticipated D/C date and plan/Code Status   DVT prophylaxis: Lovenox ordered. Code Status: Full Code.  Disposition Plan: Status is: Inpatient  Remains inpatient appropriate because:IV treatments appropriate due to intensity of illness or inability to take PO   Dispo: The patient is from: Home              Anticipated d/c is to: Home              Anticipated d/c date is: in AM              Patient currently is not medically stable to d/c. with continued diarrhea         Medical Consultants:    Neurology     Subjective:   C/o feeling weak but has also been refusing PT  Objective:    Vitals:   03/17/20 0320 03/17/20 0500 03/17/20 0719 03/17/20 1157  BP: (!) 142/78  131/76 126/66  Pulse: 64  60 75  Resp: 18  16 20   Temp: 97.9 F (36.6 C)  (!) 97.4 F (36.3 C) 98.9 F (37.2 C)  TempSrc: Oral  Oral Oral  SpO2: 100%  97% 100%  Weight:  64.5 kg    Height:        Intake/Output Summary (Last 24 hours) at 03/17/2020 1530 Last data filed at 03/17/2020 1001 Gross per 24 hour  Intake 120 ml  Output 400 ml  Net -280 ml   Filed Weights   03/15/20 0544 03/16/20 0500 03/17/20 0500  Weight: 62.4 kg 64.4 kg 64.5 kg    Exam:   General: Appearance:     Overweight female in no acute distress     Lungs:     respirations unlabored  Heart:    Normal heart rate. Normal rhythm. No murmurs, rubs, or gallops.   MS:   All extremities are intact.   Neurologic:   Awake, alert, oriented x 3. Poor insight-- c/o weakness but has been refusing to get out of bed      Data Reviewed:   I have personally reviewed following labs and imaging studies:  Labs: Labs show the following:   Basic Metabolic Panel: Recent Labs  Lab 03/11/20 1513 03/11/20 1513 03/12/20 0256 03/12/20 0256 03/13/20 0341 03/13/20 0341 03/14/20 0132 03/15/20 0246 03/16/20 0652  NA 127*  --  127*  --  130*  --  130*  --  132*  K 3.9   < > 3.7   < > 3.3*   < >  3.7  --  3.8  CL 93*  --  95*  --  99  --  99  --  99  CO2 25  --  28  --  24  --  23  --  26  GLUCOSE 113*  --  89  --  93  --  110*  --  78  BUN 10  --  8  --  7*  --  8  --  6*  CREATININE 0.57   < > 0.49  --  0.50  --  0.43* 0.54 0.52  CALCIUM 8.4*  --  8.1*  --  8.0*  --  8.1*  --  8.2*   < > = values in this interval not displayed.   GFR Estimated Creatinine Clearance: 54.9 mL/min (by C-G formula based on SCr of 0.52 mg/dL). Liver  Function Tests: Recent Labs  Lab 03/14/20 0132  AST 24  ALT 14  ALKPHOS 60  BILITOT 1.3*  PROT 5.2*  ALBUMIN 2.6*   No results for input(s): LIPASE, AMYLASE in the last 168 hours. No results for input(s): AMMONIA in the last 168 hours. Coagulation profile No results for input(s): INR, PROTIME in the last 168 hours.  CBC: Recent Labs  Lab 03/11/20 0613 03/13/20 0341 03/14/20 0132 03/16/20 0652  WBC 2.7*  2.7* 1.9* 2.5* 2.4*  NEUTROABS 1.8  --   --   --   HGB 8.0*  7.9* 7.5* 8.0* 8.4*  HCT 24.1*  23.8* 22.8* 24.8* 25.6*  MCV 115.9*  114.4* 114.6* 116.4* 113.3*  PLT 143*  136* 135* 142* 163   Cardiac Enzymes: No results for input(s): CKTOTAL, CKMB, CKMBINDEX, TROPONINI in the last 168 hours. BNP (last 3 results) No results for input(s): PROBNP in the last 8760 hours. CBG: No results for input(s): GLUCAP in the last 168 hours. D-Dimer: No results for input(s): DDIMER in the last 72 hours. Hgb A1c: No results for input(s): HGBA1C in the last 72 hours. Lipid Profile: No results for input(s): CHOL, HDL, LDLCALC, TRIG, CHOLHDL, LDLDIRECT in the last 72 hours. Thyroid function studies: No results for input(s): TSH, T4TOTAL, T3FREE, THYROIDAB in the last 72 hours.  Invalid input(s): FREET3 Anemia work up: Recent Labs    03/17/20 0718  TIBC 123*  IRON 42   Sepsis Labs: Recent Labs  Lab 03/11/20 0613 03/13/20 0341 03/14/20 0132 03/16/20 0652  WBC 2.7*  2.7* 1.9* 2.5* 2.4*    Microbiology Recent Results (from the  past 240 hour(s))  Urine culture     Status: Abnormal   Collection Time: 03/08/20 12:45 AM   Specimen: Urine, Random  Result Value Ref Range Status   Specimen Description URINE, RANDOM  Final   Special Requests   Final    NONE Performed at [facility], [address], [place], [place] [address]    Culture (A)  Final    60,000 COLONIES/mL MULTIPLE SPECIES PRESENT, SUGGEST RECOLLECTION   Report Status 03/09/2020 FINAL  Final  SARS Coronavirus 2 by RT PCR (hospital order, performed in [facility] hospital lab) Nasopharyngeal Nasopharyngeal Swab     Status: None   Collection Time: 03/08/20  1:00 AM   Specimen: Nasopharyngeal Swab  Result Value Ref Range Status   SARS Coronavirus 2 NEGATIVE NEGATIVE Final    Comment: (NOTE) SARS-CoV-2 target nucleic acids are NOT DETECTED.  The SARS-CoV-2 RNA is generally detectable in upper and lower respiratory specimens during the acute phase of infection. The lowest concentration of SARS-CoV-2 viral copies this assay can detect is 250 copies / mL. A negative result does not preclude SARS-CoV-2 infection and should not be used as the sole basis for treatment or other patient management decisions.  A negative result may occur with improper specimen collection / handling, submission of specimen other than nasopharyngeal swab, presence of viral mutation(s) within the areas targeted by this assay, and inadequate number of viral copies (<250 copies / mL). A negative result must be combined with clinical observations, patient history, and epidemiological information.  Fact Sheet for Patients:   BoilerBrush.com.cy  Fact Sheet for Healthcare Providers: https://pope.com/  This test is not yet approved or  cleared by the [place] FDA and has been authorized for detection and/or diagnosis of SARS-CoV-2 by FDA under an Emergency Use Authorization (EUA).  This EUA will remain in effect (meaning  this  test can be used) for the duration of the COVID-19 declaration under Section 564(b)(1) of the Act, 21 U.S.C. section 360bbb-3(b)(1), unless the authorization is terminated or revoked sooner.  Performed at [facility], [address], [place], [place] [address]   Culture, Urine     Status: Abnormal   Collection Time: 03/10/20  5:28 PM   Specimen: Urine, Clean Catch  Result Value Ref Range Status   Specimen Description URINE, CLEAN CATCH  Final   Special Requests   Final    NONE Performed at [facility], [address] [place], [place] [address]    Culture MULTIPLE SPECIES PRESENT, SUGGEST RECOLLECTION (A)  Final   Report Status 03/11/2020 FINAL  Final    Procedures and diagnostic studies:  DG Abd Portable 2V  Result Date: 03/15/2020 CLINICAL DATA:  Follow-up ileus. EXAM: X-RAY ABDOMEN 3 VIEWS COMPARISON:  March 12, 2020 FINDINGS: Diffuse generalized colonic dilatation is again seen. This is unchanged in appearance when compared to the prior study the visualized small bowel loops appear to be normal in caliber. There is no evidence of free air. An inferior vena cava filter is noted. No radio-opaque calculi or other significant radiographic abnormality is seen. There is marked severity dextroscoliosis of the lumbar spine. IMPRESSION: Findings suggestive of colonic ileus, unchanged in appearance when compared to the prior study. Electronically Signed   By: Aram Candela M.D.   On: 03/15/2020 19:37    Medications:   . acidophilus  1 capsule Oral Daily  . cholestyramine light  4 g Oral Daily  . clopidogrel  75 mg Oral Daily  . diltiazem  180 mg Oral Daily  . folic acid  1 mg Oral BID  . [START ON 03/18/2020] hydrocortisone  10 mg Oral Daily   And  . hydrocortisone  5 mg Oral Daily  . iron polysaccharides  150 mg Oral Daily  . multivitamin with minerals  1 tablet Oral Daily  . oxybutynin  5 mg Oral QHS  . pantoprazole  40 mg Oral BID  . sodium chloride  flush  3 mL Intravenous Q12H  . thiamine  100 mg Oral Daily   Or  . thiamine  100 mg Intravenous Daily   Continuous Infusions: . sodium chloride       LOS: 8 days   Joseph Art  [facility]   How to contact the [facility] Attending or Consulting provider 7A - 7P or covering provider during after hours 7P -7A, for this patient?  1. Check the care team in [facility] and look for a) attending/consulting [facility] provider listed and b) the [facility] team listed 2. Log into [email or web address] and use [facility] universal password to access. If you do not have the password, please contact the hospital operator. 3. Locate the [facility] provider you are looking for under [facility] and page to a number that you can be directly reached. 4. If you still have difficulty reaching the provider, please page the [facility] (Director on Call) for the Hospitalists listed on amion for assistance.  03/17/2020, 3:30 PM

## 2020-03-17 NOTE — Progress Notes (Signed)
   Covid-19 Vaccination Clinic  Name:  Beverly Lawrence    MRN: [id number] DOB: [birth year]  03/17/2020  Beverly Lawrence was observed post Covid-19 immunization for 15 minutes without incident. She was provided with Vaccine Information Sheet and instruction to access the V-Safe system.   Beverly Lawrence was instructed to call 911 with any severe reactions post vaccine: Marland Kitchen Difficulty breathing  . Swelling of face and throat  . A fast heartbeat  . A bad rash all over body  . Dizziness and weakness

## 2020-03-17 NOTE — Progress Notes (Addendum)
Error note

## 2020-03-17 NOTE — Progress Notes (Signed)
Physical Therapy Treatment Patient Details Name: Beverly Lawrence MRN: [id number] DOB: 1945-12-30 Today's Date: 03/17/2020    History of Present Illness 74 yo admitted with transient Left weakness with negative MRI and normal EEG with hyponatremia. PMHx: PAF, HTN, SIADH, GIB, diarrhea    PT Comments    Patient progressing well towards PT goals. Reports frustration with diarrhea. Tolerated gait training with Min guard assist and use of RW for support/safety; declined hallway ambulation this date. HR ranged from 99-143 bpm with activity, A-fib. Requires Min A for standing transfers. Encouraged increasing activity and OOB to chair at least 3 times/day for all meals. Will follow.   Follow Up Recommendations  Home health PT;Supervision/Assistance - 24 hour     Equipment Recommendations  None recommended by PT    Recommendations for Other Services       Precautions / Restrictions Precautions Precautions: Fall Precaution Comments: watch sats Restrictions Weight Bearing Restrictions: No    Mobility  Bed Mobility Overal bed mobility: Modified Independent             General bed mobility comments: no assist needed. able to get to sitting on edge of bed  Transfers Overall transfer level: Needs assistance Equipment used: Rolling walker (2 wheeled) Transfers: Sit to/from Stand Sit to Stand: Min assist Stand pivot transfers: Min guard       General transfer comment: Assist to power to standing from EOB x1, from [facility] x1, transferred to chair post ambulation. SPT bed to [facility] with Min guard assist and use of RW.  Ambulation/Gait Ambulation/Gait assistance: Min guard Gait Distance (Feet): 40 Feet Assistive device: Rolling walker (2 wheeled) Gait Pattern/deviations: Step-through pattern;Decreased stride length;Trunk flexed   Gait velocity interpretation: 1.31 - 2.62 ft/sec, indicative of limited community ambulator General Gait Details: Slow, mildly unsteady gait with LLE weakness;  reports LLE heaviness. No overt LOB. HR ranged from 99-143 bpm.   Stairs             Wheelchair Mobility    Modified Rankin (Stroke Patients Only)       Balance Overall balance assessment: History of Falls;Needs assistance Sitting-balance support: Feet supported;No upper extremity supported Sitting balance-Leahy Scale: Good Sitting balance - Comments: Supervision for safety.   Standing balance support: During functional activity Standing balance-Leahy Scale: Poor Standing balance comment: reliant on rw; total A for pericare                            Cognition Arousal/Alertness: Awake/alert Behavior During Therapy: WFL for tasks assessed/performed Overall Cognitive Status: Within Functional Limits for tasks assessed                                 General Comments: "the doctor wants me to get up."      Exercises      General Comments General comments (skin integrity, edema, etc.): Spouse stepped out of room for session; distended abdomen especially on left side as well as swelling in LLE. Reports heavy feeling left side of abdomen and LLE.      Pertinent Vitals/Pain Pain Assessment: No/denies pain Pain Location: swelling in    Home Living                      Prior Function            PT Goals (current goals can now be found  in the care plan section) Progress towards PT goals: Progressing toward goals    Frequency    Min 3X/week      PT Plan Current plan remains appropriate    Co-evaluation              AM-PAC PT "6 Clicks" Mobility   Outcome Measure  Help needed turning from your back to your side while in a flat bed without using bedrails?: None Help needed moving from lying on your back to sitting on the side of a flat bed without using bedrails?: None Help needed moving to and from a bed to a chair (including a wheelchair)?: A Little Help needed standing up from a chair using your arms (e.g.,  wheelchair or bedside chair)?: A Little Help needed to walk in hospital room?: A Little Help needed climbing 3-5 steps with a railing? : A Lot 6 Click Score: 19    End of Session Equipment Utilized During Treatment: Gait belt;Oxygen Activity Tolerance: Patient tolerated treatment well Patient left: in chair;with call bell/phone within reach;with chair alarm set;with family/visitor present Nurse Communication: Mobility status PT Visit Diagnosis: Unsteadiness on feet (R26.81);Muscle weakness (generalized) (M62.81);Difficulty in walking, not elsewhere classified (R26.2)     Time: 1410-1435 PT Time Calculation (min) (ACUTE ONLY): 25 min  Charges:  $Gait Training: 8-22 mins $Therapeutic Activity: 8-22 mins                     Vale Haven, PT, DPT Acute Rehabilitation Services Pager [phone] Office [phone]       Blake Divine A Lanier Ensign 03/17/2020, 3:54 PM

## 2020-03-17 NOTE — Patient Instructions (Signed)
   Covid-19 Vaccination Clinic  Name:  Beverly Lawrence    MRN: [id number] DOB: 06/26/1946  03/17/2020  Beverly Lawrence was observed post Covid-19 immunization for 15 minutes without incident. She was provided with Vaccine Information Sheet and instruction to access the V-Safe system.   Beverly Lawrence was instructed to call 911 with any severe reactions post vaccine: Marland Kitchen Difficulty breathing  . Swelling of face and throat  . A fast heartbeat  . A bad rash all over body  . Dizziness and weakness   @[email or web address] @

## 2020-03-18 ENCOUNTER — Encounter (HOSPITAL_COMMUNITY): Payer: Self-pay | Admitting: Internal Medicine

## 2020-03-18 DIAGNOSIS — R933 Abnormal findings on diagnostic imaging of other parts of digestive tract: Secondary | ICD-10-CM

## 2020-03-18 MED ORDER — RIFAXIMIN 550 MG PO TABS
550.0000 mg | ORAL_TABLET | Freq: Three times a day (TID) | ORAL | Status: DC
  Administered 2020-03-18 – 2020-03-20 (×6): 550 mg via ORAL
  Filled 2020-03-18 (×6): qty 1

## 2020-03-18 NOTE — Progress Notes (Signed)
Pt and husband were in room talking about she was upset because she had not spoken to or gotten any answers from the doctors.  I did remind them that two different doctors had been in the room talking to her today about what was going on with her abdomen and informed her of what would need to be done. She then said she remembered that.  Then per spouse was overheard saying that no one has been in the room to check on her and they were reminded that this nurse and the tech had been in there several times and she even had a student nurse who spent much of the day with her in her room assisting her and giving her special attention.  They then said they remembered that.  Spouse repeated the same questions several times in a short period.  Each time, his questions were answered and he was reminded that the questions had been answered each time.

## 2020-03-18 NOTE — Progress Notes (Signed)
Progress Note    Beverly Lawrence  QQV:[id number] DOB: Jul 14, 1945  DOA: 03/07/2020 PCP: Malka So., MD    Brief Narrative:   Medical records reviewed and are as summarized below:  Beverly Lawrence is an 74 y.o. female with past medical history significant for PAF, HTN, chronic loose stools at 2-3 per day, SIADH, hemolytic anemia, h/o GI bleed 2 years ago. Patient presented secondary to left sided weakness and admitted for concern for TIA. Patient found to have hypoglycemia in addition to hyponatremia. TIA workup initiated in addition to treatment for hyponatremia (likely hypovolemic on admission) and hypoglycemia.  Stay has been complicated with continued diarrhea.    Assessment/Plan:   Active Problems:   Hypertension   Autoimmune hemolytic anemia   TIA (transient ischemic attack)   Hypoglycemia without diagnosis of diabetes mellitus   (HFpEF) heart failure with preserved ejection fraction (HCC)   Hyponatremia   Ileus (HCC)   Hyponatremia Unsure of etiology; patient has a history of SIADH but also has recent history is of hyponatremia secondary to volume overload requiring Lasix. Patient has been treated as hypovolemic hyponatremia with IV fluids with improvement/stability -continue to monitor  Chronic respiratory failure -continue 2L  Left sided weakness -resolved -No stroke seen on CT head or MRI.  Neurology consulted for evaluation.  Possible TIA versus sequela from hypoglycemia or hyponatremia.  Neurology recommending to continue Plavix.  With patient's history of atrial fibrillation, patient would benefit from anticoagulation however this was previously discontinued secondary to history of autoimmune hemolytic disease in addition to GI bleeding.  Hypoglycemia  Hemoglobin A1c obtained and is undetectable at less than 3.5%.  ? related to possible adrenal disease vs liver disease -will need outpatient follow up  Abnormal ACTH stimulation test It does not appear this  was performed correctly  -ACTH ordered but not sure of timing: 56 but stim test not able to be done correctly in the hospital -patient clinically appears to be like adrenal insuff so will treat with cortef for now (had been on high dose steroids in past 20 mg TID but per [facility] was no longer taking-- says she was on for 2 years) -clinically she has improved since steroids started- taper to lowest dose and then close outpatient follow up  Pancytopenia- (now just low WBCs and hgb) ITP Autoimmune hemolytic anemia -? Pancytopenia being Liver related -uses alcohol daily  (3-4 glasses of wine)  Chronic diastolic heart failure Recent echocardiogram from 9/4 significant for an EF of 60 to 65% with grade 1 diastolic dysfunction.  Currently not in heart failure.  Currently not fluid overloaded.  Ileus -  Patient has a history of multiple abdominal surgeries in addition to previous bowel obstruction requiring surgical management. CT abdomen/pelvis significant for evidence of ileus. -Watch symptoms closely for worsening ileus/development of SBO (high risk) -chart reviewed and patient has had colonic distention for years- colonoscopy in 11/2018 ([facility])  Diarrhea Chronic issue. Per patient, symptoms are not out of the ordinary- says she take cholestyramine at home TID-- questran BID for now -NO imodium for now secondary to ileus -add iron QOD -she continues to have bowel incontinence -# of stools have not been documented appropriately  Dysuria Previous urine culture with multiple species.  Patient's urine looks tea colored in canister.  Most recent urinalysis not very remarkable except for small amounts of glucose in addition to small amount of ketones. Repeat urinalysis is unremarkable.  Tea colored urine Urinalysis unremarkable. Possibly urine mixed  with stool as patient has had diarrhea.  Cellulitis- appears to be chronic venous status changes Present earlier in admission and  treated empirically on Vancomycin with improvement. -Transitioned to doxycycline and d/c'd  Hypokalemia -repleted along with Mg  Family Communication/Anticipated D/C date and plan/Code Status   DVT prophylaxis: Lovenox ordered. Code Status: Full Code.  Disposition Plan: Status is: Inpatient  Remains inpatient appropriate because:IV treatments appropriate due to intensity of illness or inability to take PO   Dispo: The patient is from: Home              Anticipated d/c is to: Home              Anticipated d/c date is: when diarrhea controlled              Patient currently is not medically stable to d/c. with continued diarrhea         Medical Consultants:    Neurology  GI     Subjective:   Says she is still having multiple uncontrollable watery stools  Objective:    Vitals:   03/18/20 0005 03/18/20 0342 03/18/20 0801 03/18/20 1114  BP: 127/71 132/71 129/63 112/67  Pulse: (!) 55 (!) 56 (!) 57 77  Resp: 18 18 16 20   Temp: 97.9 F (36.6 C) 99.1 F (37.3 C) 99 F (37.2 C) 99.1 F (37.3 C)  TempSrc: Oral Oral Oral Oral  SpO2: 100% 100% 97% 97%  Weight:      Height:        Intake/Output Summary (Last 24 hours) at 03/18/2020 1455 Last data filed at 03/18/2020 1005 Gross per 24 hour  Intake 240 ml  Output 300 ml  Net -60 ml   Filed Weights   03/15/20 0544 03/16/20 0500 03/17/20 0500  Weight: 62.4 kg 64.4 kg 64.5 kg    Exam:  General: Appearance:     Overweight female in no acute distress   Abdomen distended and tender, diminished bowel regimen  Lungs:    on2L, respirations unlabored  Heart:    Normal heart rate. Normal rhythm. No murmurs, rubs, or gallops.   MS:   All extremities are intact.   Neurologic:   Awake, alert, oriented x 3. No apparent focal neurological           defect.        Data Reviewed:   I have personally reviewed following labs and imaging studies:  Labs: Labs show the following:   Basic Metabolic Panel: Recent  Labs  Lab 03/11/20 1513 03/11/20 1513 03/12/20 0256 03/12/20 0256 03/13/20 0341 03/13/20 0341 03/14/20 0132 03/15/20 0246 03/16/20 0652  NA 127*  --  127*  --  130*  --  130*  --  132*  K 3.9   < > 3.7   < > 3.3*   < > 3.7  --  3.8  CL 93*  --  95*  --  99  --  99  --  99  CO2 25  --  28  --  24  --  23  --  26  GLUCOSE 113*  --  89  --  93  --  110*  --  78  BUN 10  --  8  --  7*  --  8  --  6*  CREATININE 0.57   < > 0.49  --  0.50  --  0.43* 0.54 0.52  CALCIUM 8.4*  --  8.1*  --  8.0*  --  8.1*  --  8.2*   < > = values in this interval not displayed.   GFR Estimated Creatinine Clearance: 54.9 mL/min (by C-G formula based on SCr of 0.52 mg/dL). Liver Function Tests: Recent Labs  Lab 03/14/20 0132  AST 24  ALT 14  ALKPHOS 60  BILITOT 1.3*  PROT 5.2*  ALBUMIN 2.6*   No results for input(s): LIPASE, AMYLASE in the last 168 hours. No results for input(s): AMMONIA in the last 168 hours. Coagulation profile No results for input(s): INR, PROTIME in the last 168 hours.  CBC: Recent Labs  Lab 03/13/20 0341 03/14/20 0132 03/16/20 0652  WBC 1.9* 2.5* 2.4*  HGB 7.5* 8.0* 8.4*  HCT 22.8* 24.8* 25.6*  MCV 114.6* 116.4* 113.3*  PLT 135* 142* 163   Cardiac Enzymes: No results for input(s): CKTOTAL, CKMB, CKMBINDEX, TROPONINI in the last 168 hours. BNP (last 3 results) No results for input(s): PROBNP in the last 8760 hours. CBG: No results for input(s): GLUCAP in the last 168 hours. D-Dimer: No results for input(s): DDIMER in the last 72 hours. Hgb A1c: No results for input(s): HGBA1C in the last 72 hours. Lipid Profile: No results for input(s): CHOL, HDL, LDLCALC, TRIG, CHOLHDL, LDLDIRECT in the last 72 hours. Thyroid function studies: No results for input(s): TSH, T4TOTAL, T3FREE, THYROIDAB in the last 72 hours.  Invalid input(s): FREET3 Anemia work up: Recent Labs    03/17/20 0718  TIBC 123*  IRON 42   Sepsis Labs: Recent Labs  Lab 03/13/20 0341  03/14/20 0132 03/16/20 0652  WBC 1.9* 2.5* 2.4*    Microbiology Recent Results (from the past 240 hour(s))  Culture, Urine     Status: Abnormal   Collection Time: 03/10/20  5:28 PM   Specimen: Urine, Clean Catch  Result Value Ref Range Status   Specimen Description URINE, CLEAN CATCH  Final   Special Requests   Final    NONE Performed at [facility], [address] [place], [place] [address]    Culture MULTIPLE SPECIES PRESENT, SUGGEST RECOLLECTION (A)  Final   Report Status 03/11/2020 FINAL  Final    Procedures and diagnostic studies:  No results found.  Medications:   . acidophilus  1 capsule Oral Daily  . cholestyramine light  4 g Oral BID  . clopidogrel  75 mg Oral Daily  . diltiazem  180 mg Oral Daily  . folic acid  1 mg Oral BID  . hydrocortisone  10 mg Oral Daily   And  . hydrocortisone  5 mg Oral Daily  . [START ON 03/19/2020] iron polysaccharides  150 mg Oral QODAY  . multivitamin with minerals  1 tablet Oral Daily  . pantoprazole  40 mg Oral BID  . sodium chloride flush  3 mL Intravenous Q12H  . thiamine  100 mg Oral Daily   Or  . thiamine  100 mg Intravenous Daily   Continuous Infusions: . sodium chloride       LOS: 9 days   Joseph Art  [facility]   How to contact the [facility] Attending or Consulting provider 7A - 7P or covering provider during after hours 7P -7A, for this patient?  1. Check the care team in [facility] and look for a) attending/consulting [facility] provider listed and b) the [facility] team listed 2. Log into [email or web address] and use [place]'s universal password to access. If you do not have the password, please contact the hospital operator. 3. Locate the [facility] provider you are  looking for under [facility] and page to a number that you can be directly reached. 4. If you still have difficulty reaching the provider, please page the [facility] (Director on Call) for the Hospitalists listed on amion for assistance.  03/18/2020, 2:55 PM

## 2020-03-18 NOTE — Consult Note (Signed)
Referring Provider:  [facility]         Primary Care Physician:  Malka So., MD Primary Gastroenterologist:  [facility]           We were asked to see this patient for:    ileus              ASSESSMENT / PLAN:    #  Colonic ileus, possibly chronic intermittent --CT scan on 9/6 remarkable for diffuse colonic distention suggesting ileus. --Similar colon findings on CT scan in Feb 2019 and also April 2020 [facility]).  --Possible contributing factors to colonic ileus this admission include hyponatremia possibly Ditropan because of its anticholinergic properties.  --Fortunately her sodium is improving 123 to 132. Try and normal sodium Keep K+ in normal limites.  --Recommend holding Ditropan for now.  --Doesn't appear she is needing opioids which is helpful --She may be a candidate for low dose Neostigmine, will investigate that option.   # Chronic diarrhea --Followed by [facility]. She takes Imodium as needed. Was on Questran at one time. Patient says she doesn't take much imodium.  --Right now avoid antidiarrheals in setting of colonic ileus.   # abnormal ACTH stimulation test.  --there is question of whether test was performed correctly but she is being treated with Cortef now  # Autoimmune hemolytic anemia --Followed at [facility]  # Pancytopenia --Has autoimmune hemolytic anemia --Etiology of leukopenia and thrombocytopenia unclear. --No mention of cirrhosis on U/S in Jan nor non-contrast CT scan April 2020 in [facility].  --INR 1.2     HPI:                                                                                                                             Chief Complaint:   Beverly Lawrence is a 74 y.o. female with a pmh significant for, not necessarily limited to PAF, HTN, chronic diastolic heart failure, SIADH, DVTs, hemolytic anemia, chronic loose stool, hx of GIB / duodenal ulcer, SBO.  Marland Kitchen She was on Eliquis at some point for Afib but it was discontinued due to  severe anemia.   Patient presented to ED 10 days ago left sided weakaness. Admitted with TIA felt to be related to hponatremia vrs hypoglycemia. Diuretics stopped. MRI head without acute findings. Echo EF 60-65%. EEG within normal limites. Started on Plavix. Started on antioiotics for cellulitis.  She has been complaining of abdominal pain while hospitalized. CT scan remarkable for diffuse colonic ileus. She had imaging in 2019 and 2020 with similar colon findings. She had a colonoscopy last year at [facility] for evaluation of diarrhea. Results cannot be seen but patient says it was normal. Normal colon biopsies documented in phone not. Patient says her abdomen is very distended and she hasn't experience this before. Some nausea after eating today but otherwise tolerating solids. She has 3-4 liquid BMs a day and says that lately stool just pouring out / having incontinence.  Prior to admission she says her belly was not distended like this and she had no nausea.     PREVIOUS ENDOSCOPIC EVALUATIONS / PERTINENT STUDIES   EGD / colonoscopy [facility] ~ May 2020, ? For diarrhea. Reports cannot be seen in CareEverywhere. Per telephone note, colon biopsies were negative.     Past Medical History:  Diagnosis Date  . Anemia    WARM AUTOIMMUNE HEMOLYTIC ANEMIA  (diagnosed 11/2014; [facility])  . Anxiety   . Arthritis    currently being seen by rheumatologist for confirmed diagnosing  . Autoimmune hemolytic anemia   . CHF (congestive heart failure) (HCC)    diastolic CHF  . Diverticulitis of colon 1994   with colostomy placed and later reversed  . DVT (deep venous thrombosis) (HCC)   . Dysrhythmia    patient states that she has a "premature beat"  . GERD (gastroesophageal reflux disease)   . Headache   . Heart murmur    moderate MR/AR, moderate pulm HTN 04/2015 echo  . 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 Hx: UTI (urinary tract infection)   . Hypertension    . Hyperthyroidism   . Hyponatremia   . Peritonitis (HCC) 1977   hx of  . PONV (postoperative nausea and vomiting)   . Small bowel obstruction [facility])     Past Surgical History:  Procedure Laterality Date  . APPENDECTOMY  1966  . COLON SURGERY     colostomy placed in 1994 and reversed in 1995  . EYE SURGERY Bilateral 2016   cataracts  . LEG SURGERY Bilateral 2011   vein ablation in both legs per patient  . NASAL SINUS SURGERY Bilateral 05/15/2015   Procedure: ENDOSCOPIC SINUS SURGERY;  Surgeon: Melvenia Beam, MD;  Location: [facility] OR;  Service: ENT;  Laterality: Bilateral;  . OVARIAN CYST REMOVAL Left 1966  . pilonidal cyst  1972  . SINUS ENDO W/FUSION Bilateral 12/20/2014   Procedure: BILATERAL ENDOSCOPIC SINUS SURGERY WITH NAVIGATION/BALLOON DILATION;  Surgeon: Melvenia Beam, MD;  Location: [facility] OR;  Service: ENT;  Laterality: Bilateral;  . small bowel obs surgery    . VITRECTOMY Left 2015    Prior to Admission medications   Medication Sig Start Date End Date Taking? Authorizing Provider  acetaminophen (TYLENOL) 500 MG tablet Take 1,000 mg by mouth 4 (four) times daily.   Yes [provider]  Cyanocobalamin (VITAMIN B-12 PO) Take 1 tablet by mouth daily.   Yes [provider]  diltiazem (CARDIZEM LA) 180 MG 24 hr tablet Take 180 mg by mouth daily.   Yes [provider]  folic acid (FOLVITE) 1 MG tablet Take 1 mg by mouth daily.  11/22/14  Yes [provider]  furosemide (LASIX) 20 MG tablet Take 20 mg by mouth daily.  07/11/17  Yes [provider]  metoprolol tartrate (LOPRESSOR) 25 MG tablet Take 12.5 mg by mouth 2 (two) times daily. 02/23/20  Yes [provider]  OXYGEN Inhale 2 L into the lungs continuous.   Yes [provider]  POTASSIUM PO Take 1 tablet by mouth 4 (four) times daily.   Yes [provider]  amLODipine (NORVASC) 10 MG tablet Take 10 mg by mouth daily. Patient not taking: Reported on 03/08/2020     [provider]  diltiazem (DILACOR XR) 180 MG 24 hr capsule Take 180 mg by mouth daily. Patient not taking: Reported on 03/08/2020  [provider]  ELIQUIS 5 MG TABS tablet Take 5 mg by mouth 2 (two) times daily.  Patient not taking: Reported on 03/08/2020 05/12/17   [provider]  labetalol (NORMODYNE) 100 MG tablet Take 100 mg by mouth 2 (two) times daily. Patient not taking: Reported on 03/08/2020    [provider]  ondansetron (ZOFRAN-ODT) 4 MG disintegrating tablet Take 1 tablet (4 mg total) by mouth every 6 (six) hours as needed for nausea, vomiting or refractory nausea / vomiting. Patient not taking: Reported on 08/19/2017 05/16/15   Melvenia BeamGore, Mitchell, MD  oxybutynin (DITROPAN-XL) 5 MG 24 hr tablet Take 5 mg by mouth at bedtime. Patient not taking: Reported on 03/08/2020    [provider]  pantoprazole (PROTONIX) 40 MG tablet Take 40 mg by mouth 2 (two) times daily.  Patient not taking: Reported on 03/08/2020 11/28/14   [provider]  Potassium Chloride ER 20 MEQ TBCR TK 1 T PO D Patient not taking: Reported on 03/08/2020 06/15/17   [provider]  predniSONE (DELTASONE) 10 MG tablet Take 20 mg by mouth 3 (three) times daily. Patient not taking: Reported on 03/08/2020    [provider]  zolpidem (AMBIEN) 5 MG tablet Take 5 mg by mouth at bedtime as needed for sleep.  Patient not taking: Reported on 03/08/2020 12/12/14   [provider]    Current Facility-Administered Medications  Medication Dose Route Frequency Provider Last Rate Last Admin  . 0.9 %  sodium chloride infusion  250 mL Intravenous PRN McDonald, Mia A, PA-C      . acetaminophen (TYLENOL) tablet 650 mg  650 mg Oral Q6H PRN Norins, Rosalyn GessMichael E, MD   650 mg at 03/18/20 1304   Or  . acetaminophen (TYLENOL) suppository 650 mg  650 mg Rectal Q6H PRN Norins, Rosalyn GessMichael E, MD      . acidophilus (RISAQUAD) capsule 1 capsule  1 capsule Oral Daily Vann, Jessica U, DO    1 capsule at 03/18/20 1005  . cholestyramine light (PREVALITE) packet 4 g  4 g Oral BID Vann, Jessica U, DO   4 g at 03/18/20 1004  . clopidogrel (PLAVIX) tablet 75 mg  75 mg Oral Daily Rinehuls, David L, PA-C   75 mg at 03/18/20 1005  . diltiazem (CARDIZEM CD) 24 hr capsule 180 mg  180 mg Oral Daily Norins, Rosalyn GessMichael E, MD   180 mg at 03/18/20 1004  . folic acid (FOLVITE) tablet 1 mg  1 mg Oral BID Norins, Rosalyn GessMichael E, MD   1 mg at 03/18/20 1005  . hydrocortisone (CORTEF) tablet 10 mg  10 mg Oral Daily Marlin CanaryVann, Jessica U, DO   10 mg at 03/18/20 1003   And  . hydrocortisone (CORTEF) tablet 5 mg  5 mg Oral Daily Vann, Jessica U, DO   5 mg at 03/17/20 1559  . [START ON 03/19/2020] iron polysaccharides (NIFEREX) capsule 150 mg  150 mg Oral QODAY Vann, Jessica U, DO      . multivitamin with minerals tablet 1 tablet  1 tablet Oral Daily Vann, Jessica U, DO   1 tablet at 03/18/20 1004  . ondansetron (ZOFRAN-ODT) disintegrating tablet 4 mg  4 mg Oral Q6H PRN Norins, Rosalyn GessMichael E, MD   4 mg at 03/18/20 1304  . oxybutynin (DITROPAN-XL) 24 hr tablet 5 mg  5 mg Oral QHS Norins, Rosalyn GessMichael E, MD   5 mg at 03/17/20 2217  . pantoprazole (PROTONIX) EC tablet 40 mg  40 mg Oral  BID Jacques Navy, MD   40 mg at 03/18/20 1005  . sodium chloride flush (NS) 0.9 % injection 3 mL  3 mL Intravenous Q12H McDonald, Mia A, PA-C   3 mL at 03/15/20 0951  . sodium chloride flush (NS) 0.9 % injection 3 mL  3 mL Intravenous PRN McDonald, Mia A, PA-C      . thiamine tablet 100 mg  100 mg Oral Daily Vann, Jessica U, DO   100 mg at 03/18/20 1005   Or  . thiamine (B-1) injection 100 mg  100 mg Intravenous Daily Vann, Jessica U, DO      . zolpidem (AMBIEN) tablet 5 mg  5 mg Oral QHS PRN Norins, Rosalyn Gess, MD   5 mg at 03/17/20 2244    Allergies as of 03/07/2020 - Review Complete 03/07/2020  Allergen Reaction Noted  . Lisinopril Swelling 05/13/2015  . Codeine Other (See Comments) 08/20/2014  . Erythromycin Other (See Comments)  08/20/2014  . Morphine and related Other (See Comments) 08/20/2014  . Penicillins Hives and Other (See Comments) 08/20/2014  . Percocet [oxycodone-acetaminophen] Other (See Comments) 08/20/2014  . Percodan [oxycodone-aspirin] Other (See Comments) 08/20/2014    No family history on file.  Social History   Socioeconomic History  . Marital status: Married    Spouse name: Not on file  . Number of children: Not on file  . Years of education: Not on file  . Highest education level: Not on file  Occupational History  . Not on file  Tobacco Use  . Smoking status: Former Smoker    Quit date: 07/05/2004    Years since quitting: 15.7  . Smokeless tobacco: Never Used  Substance and Sexual Activity  . Alcohol use: Yes    Alcohol/week: 2.0 standard drinks    Types: 2 Glasses of wine per week  . Drug use: No  . Sexual activity: Not on file  Other Topics Concern  . Not on file  Social History Narrative  . Not on file   Social Determinants of Health   Financial Resource Strain:   . Difficulty of Paying Living Expenses: Not on file  Food Insecurity:   . Worried About Programme researcher, broadcasting/film/video in the Last Year: Not on file  . Ran Out of Food in the Last Year: Not on file  Transportation Needs:   . Lack of Transportation (Medical): Not on file  . Lack of Transportation (Non-Medical): Not on file  Physical Activity:   . Days of Exercise per Week: Not on file  . Minutes of Exercise per Session: Not on file  Stress:   . Feeling of Stress : Not on file  Social Connections:   . Frequency of Communication with Friends and Family: Not on file  . Frequency of Social Gatherings with Friends and Family: Not on file  . Attends Religious Services: Not on file  . Active Member of Clubs or Organizations: Not on file  . Attends Banker Meetings: Not on file  . Marital Status: Not on file  Intimate Partner Violence:   . Fear of Current or Ex-Partner: Not on file  . Emotionally Abused: Not  on file  . Physically Abused: Not on file  . Sexually Abused: Not on file    Review of Systems: All systems reviewed and negative except where noted in HPI.    OBJECTIVE:    Physical Exam: Vital signs in last 24 hours: Temp:  [97.9 F (36.6 C)-99.1 F (37.3 C)]  99.1 F (37.3 C) (09/14 1114) Pulse Rate:  [55-77] 77 (09/14 1114) Resp:  [16-20] 20 (09/14 1114) BP: (112-132)/(63-83) 112/67 (09/14 1114) SpO2:  [97 %-100 %] 97 % (09/14 1114) Last BM Date: 03/17/20 General:   Alert, well-developed female in NAD Psych:  Pleasant, cooperative. Normal mood and affect. Eyes:  Pupils equal, sclera clear, no icterus.   Conjunctiva pink. Ears:  Normal auditory acuity. Nose:  No deformity, discharge,  or lesions. Neck:  Supple; no masses Lungs:  Clear throughout to auscultation.   No wheezes, crackles, or rhonchi.  Heart:  Regular rate and rhythm;, no lower extremity edema Abdomen:  Soft, largely distended with "metallic" bowel sounds. She is not tender Rectal:  Deferred  Msk:  Symmetrical without gross deformities. . Neurologic:  Alert and  oriented x4;  grossly normal neurologically. Skin:  Intact without significant lesions or rashes.  Filed Weights   03/15/20 0544 03/16/20 0500 03/17/20 0500  Weight: 62.4 kg 64.4 kg 64.5 kg     Scheduled inpatient medications . acidophilus  1 capsule Oral Daily  . cholestyramine light  4 g Oral BID  . clopidogrel  75 mg Oral Daily  . diltiazem  180 mg Oral Daily  . folic acid  1 mg Oral BID  . hydrocortisone  10 mg Oral Daily   And  . hydrocortisone  5 mg Oral Daily  . [START ON 03/19/2020] iron polysaccharides  150 mg Oral QODAY  . multivitamin with minerals  1 tablet Oral Daily  . oxybutynin  5 mg Oral QHS  . pantoprazole  40 mg Oral BID  . sodium chloride flush  3 mL Intravenous Q12H  . thiamine  100 mg Oral Daily   Or  . thiamine  100 mg Intravenous Daily      Intake/Output from previous day: 09/13 0701 - 09/14 0700 In: 120  [P.O.:120] Out: 100 [Urine:100] Intake/Output this shift: Total I/O In: 120 [P.O.:120] Out: 300 [Urine:300]   Lab Results: Recent Labs    03/16/20 0652  WBC 2.4*  HGB 8.4*  HCT 25.6*  PLT 163   BMET Recent Labs    03/16/20 0652  NA 132*  K 3.8  CL 99  CO2 26  GLUCOSE 78  BUN 6*  CREATININE 0.52  CALCIUM 8.2*   LFT No results for input(s): PROT, ALBUMIN, AST, ALT, ALKPHOS, BILITOT, BILIDIR, IBILI in the last 72 hours. PT/INR No results for input(s): LABPROT, INR in the last 72 hours. Hepatitis Panel No results for input(s): HEPBSAG, HCVAB, HEPAIGM, HEPBIGM in the last 72 hours.   . CBC Latest Ref Rng & Units 03/16/2020 03/14/2020 03/13/2020  WBC 4.0 - 10.5 K/uL 2.4(L) 2.5(L) 1.9(L)  Hemoglobin 12.0 - 15.0 g/dL 1.6(X) 0.9(U) 7.5(L)  Hematocrit 36 - 46 % 25.6(L) 24.8(L) 22.8(L)  Platelets 150 - 400 K/uL 163 142(L) 135(L)    . CMP Latest Ref Rng & Units 03/16/2020 03/15/2020 03/14/2020  Glucose 70 - 99 mg/dL 78 - 045(W)  BUN 8 - 23 mg/dL 6(L) - 8  Creatinine 0.98 - 1.00 mg/dL 1.19 1.47 8.29(F)  Sodium 135 - 145 mmol/L 132(L) - 130(L)  Potassium 3.5 - 5.1 mmol/L 3.8 - 3.7  Chloride 98 - 111 mmol/L 99 - 99  CO2 22 - 32 mmol/L 26 - 23  Calcium 8.9 - 10.3 mg/dL 8.2(L) - 8.1(L)  Total Protein 6.5 - 8.1 g/dL - - 5.2(L)  Total Bilirubin 0.3 - 1.2 mg/dL - - 1.3(H)  Alkaline Phos 38 - 126 U/L - -  60  AST 15 - 41 U/L - - 24  ALT 0 - 44 U/L - - 14   Studies/Results: No results found.  Active Problems:   Hypertension   Autoimmune hemolytic anemia   TIA (transient ischemic attack)   Hypoglycemia without diagnosis of diabetes mellitus   (HFpEF) heart failure with preserved ejection fraction (HCC)   Hyponatremia   Ileus (HCC)    Willette Cluster, NP-C @  03/18/2020, 1:09 PM

## 2020-03-19 DIAGNOSIS — E876 Hypokalemia: Secondary | ICD-10-CM

## 2020-03-19 DIAGNOSIS — R197 Diarrhea, unspecified: Secondary | ICD-10-CM

## 2020-03-19 DIAGNOSIS — K580 Irritable bowel syndrome with diarrhea: Secondary | ICD-10-CM

## 2020-03-19 LAB — BASIC METABOLIC PANEL
Anion gap: 7 (ref 5–15)
BUN: 9 mg/dL (ref 8–23)
CO2: 27 mmol/L (ref 22–32)
Calcium: 8 mg/dL — ABNORMAL LOW (ref 8.9–10.3)
Chloride: 99 mmol/L (ref 98–111)
Creatinine, Ser: 0.51 mg/dL (ref 0.44–1.00)
GFR calc Af Amer: >60 mL/min (ref >60)
GFR calc non Af Amer: >60 mL/min (ref >60)
Glucose, Bld: 92 mg/dL (ref 70–99)
Potassium: 2.9 mmol/L — ABNORMAL LOW (ref 3.5–5.1)
Sodium: 133 mmol/L — ABNORMAL LOW (ref 135–145)

## 2020-03-19 LAB — CBC
HCT: 26.3 % — ABNORMAL LOW (ref 36.0–46.0)
Hemoglobin: 8.7 g/dL — ABNORMAL LOW (ref 12.0–15.0)
MCH: 36.9 pg — ABNORMAL HIGH (ref 26.0–34.0)
MCHC: 33.1 g/dL (ref 30.0–36.0)
MCV: 111.4 fL — ABNORMAL HIGH (ref 80.0–100.0)
Platelets: 170 10*3/uL (ref 150–400)
RBC: 2.36 MIL/uL — ABNORMAL LOW (ref 3.87–5.11)
RDW: 13.2 % (ref 11.5–15.5)
WBC: 3.8 10*3/uL — ABNORMAL LOW (ref 4.0–10.5)
nRBC: 0 % (ref 0.0–0.2)

## 2020-03-19 LAB — MAGNESIUM: Magnesium: 1.8 mg/dL (ref 1.7–2.4)

## 2020-03-19 MED ORDER — POTASSIUM CHLORIDE CRYS ER 20 MEQ PO TBCR
40.0000 meq | EXTENDED_RELEASE_TABLET | ORAL | Status: AC
  Administered 2020-03-19 (×2): 40 meq via ORAL
  Filled 2020-03-19 (×2): qty 2

## 2020-03-19 NOTE — Progress Notes (Signed)
Physical Therapy Treatment Patient Details Name: Beverly Lawrence MRN: [id number] DOB: [birth year] Today's Date: 03/19/2020    History of Present Illness 74 yo admitted with transient Left weakness with negative MRI and normal EEG with hyponatremia. PMHx: PAF, HTN, SIADH, GIB, diarrhea    PT Comments    Pt progressing towards physical therapy goals. Was able to perform transfers and ambulation with gross min assist with RW for support. Tolerance for functional activity is low and pt fatiguing quickly during session. Pt perseverating on wanting to return home at d/c and not go to SNF level rehab. Pt was reoriented to the plan for home health therapies to follow-up at d/c, however pt visibly upset when talking about not wanting to go to SNF. Educated pt on the need to continue working with therapies while admitted to improve endurance and strength. Will continue to follow and progress as able per POC.      Follow Up Recommendations  Home health PT;Supervision/Assistance - 24 hour     Equipment Recommendations  None recommended by PT    Recommendations for Other Services       Precautions / Restrictions Precautions Precautions: Fall Precaution Comments: watch sats Restrictions Weight Bearing Restrictions: No    Mobility  Bed Mobility               General bed mobility comments: Pt was received sitting up in recliner.   Transfers Overall transfer level: Needs assistance Equipment used: Rolling walker (2 wheeled) Transfers: Sit to/from Stand Sit to Stand: Min assist         General transfer comment: VC's for hand placement on seated surface for safety. Pt was able to power-up to full stand with assist and RW for support.   Ambulation/Gait Ambulation/Gait assistance: Min guard Gait Distance (Feet): 50 Feet (x2 with seated rest in between) Assistive device: Rolling walker (2 wheeled) Gait Pattern/deviations: Step-through pattern;Decreased stride length;Trunk flexed Gait  velocity: WFL Gait velocity interpretation: <1.31 ft/sec, indicative of household ambulator General Gait Details: Pt reports feeling "heavy" all over. Pt fatigued quickly and was only able to tolerate minimal ambulation to hallway before asking to turn around and return to the chair. 2 bouts of walking was all pt was able to tolerate at this time.    Stairs             Wheelchair Mobility    Modified Rankin (Stroke Patients Only)       Balance Overall balance assessment: History of Falls;Needs assistance Sitting-balance support: Feet supported;No upper extremity supported Sitting balance-Leahy Scale: Good Sitting balance - Comments: Supervision for safety.   Standing balance support: During functional activity Standing balance-Leahy Scale: Poor Standing balance comment: reliant on RW                            Cognition Arousal/Alertness: Awake/alert Behavior During Therapy: WFL for tasks assessed/performed Overall Cognitive Status: Impaired/Different from baseline Area of Impairment: Safety/judgement;Awareness                       Following Commands: Follows one step commands consistently Safety/Judgement: Decreased awareness of safety;Decreased awareness of deficits Awareness: Emergent          Exercises      General Comments        Pertinent Vitals/Pain Pain Assessment: Faces Faces Pain Scale: Hurts a little bit Pain Location: abdomen Pain Descriptors / Indicators: Cramping;Discomfort Pain Intervention(s): Monitored during session;Repositioned  Home Living                      Prior Function            PT Goals (current goals can now be found in the care plan section) Acute Rehab PT Goals Patient Stated Goal: return home - does NOT want to go to SNF PT Goal Formulation: With patient Time For Goal Achievement: 03/23/20 Potential to Achieve Goals: Good Progress towards PT goals: Progressing toward goals     Frequency    Min 3X/week      PT Plan Current plan remains appropriate    Co-evaluation              AM-PAC PT "6 Clicks" Mobility   Outcome Measure  Help needed turning from your back to your side while in a flat bed without using bedrails?: None Help needed moving from lying on your back to sitting on the side of a flat bed without using bedrails?: None Help needed moving to and from a bed to a chair (including a wheelchair)?: A Little Help needed standing up from a chair using your arms (e.g., wheelchair or bedside chair)?: A Little Help needed to walk in hospital room?: A Little Help needed climbing 3-5 steps with a railing? : A Lot 6 Click Score: 19    End of Session Equipment Utilized During Treatment: Gait belt;Oxygen Activity Tolerance: Patient tolerated treatment well Patient left: in chair;with call bell/phone within reach;with chair alarm set;with family/visitor present Nurse Communication: Mobility status PT Visit Diagnosis: Unsteadiness on feet (R26.81);Muscle weakness (generalized) (M62.81);Difficulty in walking, not elsewhere classified (R26.2)     Time: 7371-0626 PT Time Calculation (min) (ACUTE ONLY): 23 min  Charges:  $Gait Training: 23-37 mins                     Conni Slipper, PT, DPT Acute Rehabilitation Services Pager: [phone] Office: [phone]    Marylynn Pearson 03/19/2020, 2:10 PM

## 2020-03-19 NOTE — Progress Notes (Addendum)
Daily Rounding Note  03/19/2020, 9:50 AM  LOS: 10 days   SUBJECTIVE:   Chief complaint:  Loose stool, ileus   Had large bout of diarrhea this AM, preceeded by abd cramps that subsided after BM.  Nausea resolved w po Zofran this AM, now hungry and tucking into breakfast.    OBJECTIVE:         Vital signs in last 24 hours:    Temp:  [97.3 F (36.3 C)-99.1 F (37.3 C)] 98.7 F (37.1 C) (09/15 0833) Pulse Rate:  [58-77] 75 (09/15 0833) Resp:  [16-20] 20 (09/15 0833) BP: (107-135)/(55-75) 132/75 (09/15 0833) SpO2:  [97 %-100 %] 100 % (09/15 0833) Weight:  [65.3 kg] 65.3 kg (09/15 0349) Last BM Date: 03/18/20 Filed Weights   03/16/20 0500 03/17/20 0500 03/19/20 0349  Weight: 64.4 kg 64.5 kg 65.3 kg   General: alert, comfortable, not acutely ill looking.   Heart: RRR Chest: clear bil.  No SOB, cough Abdomen: soft, tympanitic BS, NT, no obvious distention.  Examined while pt sitting in chair  Extremities: no CCE Neuro/Psych:  Appropriate, fluid speech, moves all 4 liimbs, no tremors.    Intake/Output from previous day: 09/14 0701 - 09/15 0700 In: 360 [P.O.:360] Out: 980 [Urine:980]  Intake/Output this shift: No intake/output data recorded.  Lab Results: Recent Labs    03/19/20 0157  WBC 3.8*  HGB 8.7*  HCT 26.3*  PLT 170   BMET Recent Labs    03/19/20 0157  NA 133*  K 2.9*  CL 99  CO2 27  GLUCOSE 92  BUN 9  CREATININE 0.51  CALCIUM 8.0*   LFT No results for input(s): PROT, ALBUMIN, AST, ALT, ALKPHOS, BILITOT, BILIDIR, IBILI in the last 72 hours. PT/INR No results for input(s): LABPROT, INR in the last 72 hours. Hepatitis Panel No results for input(s): HEPBSAG, HCVAB, HEPAIGM, HEPBIGM in the last 72 hours.  Studies/Results: No results found.   Scheduled Meds: . acidophilus  1 capsule Oral Daily  . cholestyramine light  4 g Oral BID  . clopidogrel  75 mg Oral Daily  . diltiazem  180 mg Oral  Daily  . folic acid  1 mg Oral BID  . hydrocortisone  10 mg Oral Daily   And  . hydrocortisone  5 mg Oral Daily  . iron polysaccharides  150 mg Oral QODAY  . multivitamin with minerals  1 tablet Oral Daily  . pantoprazole  40 mg Oral BID  . potassium chloride  40 mEq Oral Q4H  . rifaximin  550 mg Oral TID  . sodium chloride flush  3 mL Intravenous Q12H  . thiamine  100 mg Oral Daily   Or  . thiamine  100 mg Intravenous Daily   Continuous Infusions: . sodium chloride     PRN Meds:.sodium chloride, acetaminophen **OR** acetaminophen, ondansetron, sodium chloride flush, zolpidem   ASSESMENT:   *   Chronic recurrent ileus.  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  Day 2/14 trial Rifaximin for ? SBBO. On Cholestyramine, acidophalus Na 118 >> 133.  Glucose 92     *   Hypokalemia 2.9.  80 meq po potassium ordered this AM.    *   TIA in setting hyponatremia and hypoglycemia.  plavix in place.  Had not been taking previously Rxd Eliquis.  .    *   Autoimmune hemolytic anemia.  MCV macrocytic. On po iron  and folic acid      *   Abnormal ACTH test, ? If performed properly but on hydrocortisone.     PLAN   *  If goal to have ileus resolved by xray, this may not be realilstic given chronicity of ileus.    *   At discharge suggest she fup w her GI Dr Clovis Pu at [facility], pt is aware of this.    *   Suspect pt will find cost of Rifaximin prohibitive when she goes to fill remainder of Rx but still worth completing a 14 d course.       Jennye Moccasin  03/19/2020, 9:50 AM Phone [phone]   Attending physician's note   I have taken an interval history, reviewed the chart and examined the patient. I agree with the Advanced Practitioner's note, impression and recommendations.   Chronic ileus, chronic diarrhea s/p partial colectomy with colostomy and reversal  Overall feeling better.  Diet as tolerated  Turn in bed  every 4 hours from side to side and out of bed to chair 2-3 times daily as tolerated.  Replete potassium to >4 and magnesium to >2  Continue rifaximin for 2-week course for IBS-diarrhea  Continue cholestyramine  Follow-up in GI office on discharge   I have spent 25 minutes of patient care (this includes precharting, chart review, review of results, face-to-face time used for counseling as well as treatment plan and follow-up. The patient was provided an opportunity to ask questions and all were answered. The patient agreed with the plan and demonstrated an understanding of the instructions.  Iona Beard , MD [phone]

## 2020-03-19 NOTE — Progress Notes (Signed)
Progress Note    Beverly Lawrence  JJK:[id number] DOB: 12/13/1945  DOA: 03/07/2020 PCP: Malka So., MD    Brief Narrative:   Medical records reviewed and are as summarized below:  Beverly Lawrence is an 74 y.o. female with past medical history significant for PAF, HTN, chronic loose stools at 2-3 per day, SIADH, hemolytic anemia, h/o GI bleed 2 years ago. Patient presented secondary to left sided weakness and admitted for concern for TIA. Patient found to have hypoglycemia in addition to hyponatremia. TIA workup initiated in addition to treatment for hyponatremia (likely hypovolemic on admission) and hypoglycemia.  Stay has been complicated with continued diarrhea.    Assessment/Plan:   Active Problems:   Hypertension   Autoimmune hemolytic anemia   TIA (transient ischemic attack)   Hypoglycemia without diagnosis of diabetes mellitus   (HFpEF) heart failure with preserved ejection fraction (HCC)   Hyponatremia   Ileus (HCC)   Hyponatremia Unsure of etiology; patient has a history of SIADH but also has recent history is of hyponatremia secondary to volume overload requiring Lasix.  Her current episode of hyponatremia thought to be due to hypovolemia.  She was given IV fluids with improvement.  Sodium level noted to be 133 today.    Ileus -  Patient has a history of multiple abdominal surgeries in addition to previous bowel obstruction requiring surgical management. CT abdomen/pelvis significant for evidence of ileus. -chart reviewed and patient has had colonic distention for years- colonoscopy in 11/2018 ([facility]) Ileus seems to be improving.  Patient had a large bowel movement today.  Gastroenterology continues to follow.  Replace potassium.  Diarrhea Chronic issue per patient, symptoms are not out of the ordinary- says she take cholestyramine at home TID-- questran BID for now -NO imodium for now secondary to ileus -add iron QOD -she continues to have bowel  incontinence GI continues to follow.  Chronic hypoxic respiratory failure -continue 2L  Left sided weakness -resolved -No stroke seen on CT head or MRI.  Neurology consulted for evaluation.  Possible TIA versus sequela from hypoglycemia or hyponatremia.  Neurology recommending to continue Plavix.  With patient's history of atrial fibrillation, patient would benefit from anticoagulation however this was previously discontinued secondary to history of autoimmune hemolytic disease in addition to GI bleeding.  Hypoglycemia  Hemoglobin A1c obtained and is undetectable at less than 3.5%.  ? related to possible adrenal disease vs liver disease -will need outpatient follow up Glucose levels have stabilized.  Encourage oral intake.  Abnormal ACTH stimulation test It does not appear this was performed correctly  -ACTH ordered but not sure of timing: 57 but stim test not able to be done correctly in the hospital -patient clinically appears to be like adrenal insuff so will treat with cortef for now (had been on high dose steroids in past 20 mg TID but per [facility] was no longer taking-- says she was on for 2 years) -clinically she has improved since steroids started Currently on hydrocortisone 10 mg in the morning and 5 mg in the evening.  Continue for now. She will need to follow-up with endocrinology in the outpatient setting.  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 Pancytopenia being Liver related Hemoglobin is stable.  Chronic diastolic heart failure Recent echocardiogram from 9/4 significant for an EF of 60 to 65% with grade 1 diastolic dysfunction.  Seems to be reasonably well compensated  Dysuria Previous urine culture with multiple species.  Patient's urine looks tea colored in canister.  Most recent urinalysis not very remarkable except for small amounts of glucose in addition to small amount of ketones. Repeat urinalysis is  unremarkable.  Cellulitis- appears to be chronic venous status changes Present earlier in admission and treated empirically on Vancomycin with improvement. -Transitioned to doxycycline and d/c'd  Hypokalemia Potassium 2.9 today.  Will be repleted.  Follow-up magnesium level.  Alcohol use Drinks 3 to 4 glasses of wine daily.  Continue thiamine.  Family Communication/Anticipated D/C date and plan/Code Status   DVT prophylaxis: Lovenox ordered. Code Status: Full Code.  Disposition Plan: Plan is for her to go home when stable from medical standpoint.   Status is: Inpatient  Remains inpatient appropriate because:IV treatments appropriate due to intensity of illness or inability to take PO   Dispo: The patient is from: Home              Anticipated d/c is to: Home              Anticipated d/c date is: when diarrhea controlled              Patient currently is not medically stable to d/c. with continued diarrhea         Medical Consultants:    Neurology  GI     Subjective:   States that her diarrhea is worse than usual.  Also admits to some abdominal distention at times.  Some nausea but no vomiting.  Objective:    Vitals:   03/18/20 1937 03/19/20 0034 03/19/20 0349 03/19/20 0833  BP: 107/62 (!) 125/55 135/71 132/75  Pulse: 64 (!) 58 68 75  Resp: 17 16 17 20   Temp: 98.2 F (36.8 C) (!) 97.3 F (36.3 C) 98.2 F (36.8 C) 98.7 F (37.1 C)  TempSrc: Oral Oral  Oral  SpO2: 100% 100% 100% 100%  Weight:   65.3 kg   Height:        Intake/Output Summary (Last 24 hours) at 03/19/2020 1028 Last data filed at 03/19/2020 0400 Gross per 24 hour  Intake 240 ml  Output 680 ml  Net -440 ml   Filed Weights   03/16/20 0500 03/17/20 0500 03/19/20 0349  Weight: 64.4 kg 64.5 kg 65.3 kg    Exam:  General appearance: Awake alert.  In no distress Resp: Clear to auscultation bilaterally.  Normal effort Cardio: S1-S2 is normal regular.  No S3-S4.  No rubs murmurs or  bruit GI: Abdomen is soft.  Mildly distended.  Nontender.  No masses organomegaly.  Bowel sounds are present.   Extremities: No edema.  Full range of motion of lower extremities. Neurologic:  No focal neurological deficits.     Data Reviewed:    Labs:  Basic Metabolic Panel: Recent Labs  Lab 03/13/20 0341 03/13/20 0341 03/14/20 0132 03/14/20 0132 03/15/20 0246 03/16/20 0652 03/19/20 0157  NA 130*  --  130*  --   --  132* 133*  K 3.3*   < > 3.7   < >  --  3.8 2.9*  CL 99  --  99  --   --  99 99  CO2 24  --  23  --   --  26 27  GLUCOSE 93  --  110*  --   --  78 92  BUN 7*  --  8  --   --  6* 9  CREATININE 0.50  --  0.43*  --  0.54 0.52 0.51  CALCIUM  8.0*  --  8.1*  --   --  8.2* 8.0*   < > = values in this interval not displayed.   GFR Estimated Creatinine Clearance: 55.3 mL/min (by C-G formula based on SCr of 0.51 mg/dL). Liver Function Tests: Recent Labs  Lab 03/14/20 0132  AST 24  ALT 14  ALKPHOS 60  BILITOT 1.3*  PROT 5.2*  ALBUMIN 2.6*    CBC: Recent Labs  Lab 03/13/20 0341 03/14/20 0132 03/16/20 0652 03/19/20 0157  WBC 1.9* 2.5* 2.4* 3.8*  HGB 7.5* 8.0* 8.4* 8.7*  HCT 22.8* 24.8* 25.6* 26.3*  MCV 114.6* 116.4* 113.3* 111.4*  PLT 135* 142* 163 170   Anemia work up: Recent Labs    03/17/20 0718  TIBC 123*  IRON 42   Sepsis Labs: Recent Labs  Lab 03/13/20 0341 03/14/20 0132 03/16/20 0652 03/19/20 0157  WBC 1.9* 2.5* 2.4* 3.8*    Microbiology Recent Results (from the past 240 hour(s))  Culture, Urine     Status: Abnormal   Collection Time: 03/10/20  5:28 PM   Specimen: Urine, Clean Catch  Result Value Ref Range Status   Specimen Description URINE, CLEAN CATCH  Final   Special Requests   Final    NONE Performed at [facility], [address] [place], [place] [address]    Culture MULTIPLE SPECIES PRESENT, SUGGEST RECOLLECTION (A)  Final   Report Status 03/11/2020 FINAL  Final    Procedures and diagnostic studies:  No  results found.  Medications:   . acidophilus  1 capsule Oral Daily  . cholestyramine light  4 g Oral BID  . clopidogrel  75 mg Oral Daily  . diltiazem  180 mg Oral Daily  . folic acid  1 mg Oral BID  . hydrocortisone  10 mg Oral Daily   And  . hydrocortisone  5 mg Oral Daily  . iron polysaccharides  150 mg Oral QODAY  . multivitamin with minerals  1 tablet Oral Daily  . pantoprazole  40 mg Oral BID  . potassium chloride  40 mEq Oral Q4H  . rifaximin  550 mg Oral TID  . sodium chloride flush  3 mL Intravenous Q12H  . thiamine  100 mg Oral Daily   Or  . thiamine  100 mg Intravenous Daily   Continuous Infusions: . sodium chloride       LOS: 10 days   Osvaldo Shipper  [facility]   How to contact the [facility] Attending or Consulting provider 7A - 7P or covering provider during after hours 7P -7A, for this patient?  1. Check the care team in [facility] and look for a) attending/consulting TRH provider listed and b) the [facility] team listed 2. Log into [email or web address] and use [facility] universal password to access. If you do not have the password, please contact the hospital operator. 3. Locate the [facility] provider you are looking for under [facility] and page to a number that you can be directly reached. 4. If you still have difficulty reaching the provider, please page the [facility] (Director on Call) for the Hospitalists listed on amion for assistance.  03/19/2020, 10:28 AM

## 2020-03-20 LAB — BASIC METABOLIC PANEL
Anion gap: 10 (ref 5–15)
BUN: 9 mg/dL (ref 8–23)
CO2: 24 mmol/L (ref 22–32)
Calcium: 8.2 mg/dL — ABNORMAL LOW (ref 8.9–10.3)
Chloride: 99 mmol/L (ref 98–111)
Creatinine, Ser: 0.55 mg/dL (ref 0.44–1.00)
GFR calc Af Amer: >60 mL/min (ref >60)
GFR calc non Af Amer: >60 mL/min (ref >60)
Glucose, Bld: 107 mg/dL — ABNORMAL HIGH (ref 70–99)
Potassium: 3.2 mmol/L — ABNORMAL LOW (ref 3.5–5.1)
Sodium: 133 mmol/L — ABNORMAL LOW (ref 135–145)

## 2020-03-20 LAB — MAGNESIUM: Magnesium: 1.7 mg/dL (ref 1.7–2.4)

## 2020-03-20 MED ORDER — MAGNESIUM SULFATE 2 GM/50ML IV SOLN: 2.0000 g | Freq: Once | INTRAVENOUS | Status: DC

## 2020-03-20 MED ORDER — POTASSIUM CHLORIDE CRYS ER 20 MEQ PO TBCR: 20.0000 meq | EXTENDED_RELEASE_TABLET | Freq: Every day | ORAL | 0 refills | Status: AC

## 2020-03-20 MED ORDER — CLOPIDOGREL BISULFATE 75 MG PO TABS: 75.0000 mg | ORAL_TABLET | Freq: Every day | ORAL | 2 refills | Status: AC

## 2020-03-20 MED ORDER — THIAMINE HCL 100 MG PO TABS: 100.0000 mg | ORAL_TABLET | Freq: Every day | ORAL | 1 refills | Status: AC

## 2020-03-20 MED ORDER — HYDROCORTISONE 5 MG PO TABS: ORAL_TABLET | ORAL | 1 refills | Status: AC

## 2020-03-20 MED ORDER — CHOLESTYRAMINE LIGHT 4 G PO PACK: 4.0000 g | PACK | Freq: Two times a day (BID) | ORAL | 0 refills | Status: AC

## 2020-03-20 MED ORDER — RISAQUAD PO CAPS: 1.0000 | ORAL_CAPSULE | Freq: Every day | ORAL | 0 refills | Status: AC

## 2020-03-20 MED ORDER — RIFAXIMIN 550 MG PO TABS: 550.0000 mg | ORAL_TABLET | Freq: Three times a day (TID) | ORAL | 0 refills | Status: AC

## 2020-03-20 MED ORDER — POTASSIUM CHLORIDE CRYS ER 20 MEQ PO TBCR
40.0000 meq | EXTENDED_RELEASE_TABLET | Freq: Once | ORAL | Status: AC
  Administered 2020-03-20: 40 meq via ORAL
  Filled 2020-03-20: qty 2

## 2020-03-20 MED ORDER — POLYSACCHARIDE IRON COMPLEX 150 MG PO CAPS: 150.0000 mg | ORAL_CAPSULE | ORAL | 0 refills | Status: AC

## 2020-03-20 NOTE — Progress Notes (Signed)
Occupational Therapy Treatment Patient Details Name: Beverly Lawrence MRN: [id number] DOB: 08-05-1945 Today's Date: 03/20/2020    History of present illness 74 yo admitted with transient Left weakness with negative MRI and normal EEG with hyponatremia. PMHx: PAF, HTN, SIADH, GIB, diarrhea   OT comments  Pt seen for OT follow up session to focus on safe d/c planning. Pt continues to require encouragement to engage in OT sessions. Reviewed safe usage of DME, AE, and fall prevention strategies to apply to home environment. Pt reports having all equipment needed from previous health issues. She is able to complete toilet transfers at min A with RW and walk household distances with RW. Continue to recommend HHOT to progress pt in home environment. Will continue to follow, but anticipate pt to d/c this PM.   Follow Up Recommendations  Home health OT;Supervision/Assistance - 24 hour    Equipment Recommendations  None recommended by OT    Recommendations for Other Services      Precautions / Restrictions Precautions Precautions: Fall Restrictions Weight Bearing Restrictions: No       Mobility Bed Mobility Overal bed mobility: Modified Independent                Transfers Overall transfer level: Needs assistance Equipment used: Rolling walker (2 wheeled) Transfers: Sit to/from Stand Sit to Stand: Min assist         General transfer comment: VC's for hand placement on seated surface for safety. Pt was able to power-up to full stand with assist and RW for support.     Balance Overall balance assessment: History of Falls;Needs assistance Sitting-balance support: Feet supported;No upper extremity supported Sitting balance-Leahy Scale: Good     Standing balance support: During functional activity Standing balance-Leahy Scale: Poor Standing balance comment: reliant on external support                           ADL either performed or assessed with clinical  judgement   ADL Overall ADL's : Needs assistance/impaired                       Lower Body Dressing Details (indicate cue type and reason): educated on use of sock aide and long handled reacher to improve pt independence. Reports being familiar with AE Toilet Transfer: Minimal assistance;Squat-pivot;Ambulation;RW             General ADL Comments: Reviewed safe DME/AE usage for ADL in home environment. Also reviewed fall prevention strategies to apply at home     Vision Patient Visual Report: No change from baseline     Perception     Praxis      Cognition Arousal/Alertness: Awake/alert Behavior During Therapy: WFL for tasks assessed/performed Overall Cognitive Status: Impaired/Different from baseline Area of Impairment: Safety/judgement;Awareness                         Safety/Judgement: Decreased awareness of safety;Decreased awareness of deficits Awareness: Emergent   General Comments: requires increased encouragement to participate in therapy. Poor awareness of current condition and how remaining immobile could negatively impact her health        Exercises     Shoulder Instructions       General Comments      Pertinent Vitals/ Pain       Pain Assessment: No/denies pain Pain Location: abdomen  Home Living  Prior Functioning/Environment              Frequency  Min 2X/week        Progress Toward Goals  OT Goals(current goals can now be found in the care plan section)  Progress towards OT goals: Progressing toward goals  Acute Rehab OT Goals Patient Stated Goal: refusing SNF, wanting to go home with husband Time For Goal Achievement: 03/29/20 Potential to Achieve Goals: Good  Plan Discharge plan remains appropriate    Co-evaluation                 AM-PAC OT "6 Clicks" Daily Activity     Outcome Measure   Help from another person eating meals?: A  Little Help from another person taking care of personal grooming?: A Little Help from another person toileting, which includes using toliet, bedpan, or urinal?: A Little Help from another person bathing (including washing, rinsing, drying)?: A Lot Help from another person to put on and taking off regular upper body clothing?: A Little Help from another person to put on and taking off regular lower body clothing?: A Lot 6 Click Score: 16    End of Session Equipment Utilized During Treatment: Gait belt;Rolling walker  OT Visit Diagnosis: Unsteadiness on feet (R26.81);Muscle weakness (generalized) (M62.81);History of falling (Z91.81)   Activity Tolerance Patient tolerated treatment well   Patient Left in bed;with call bell/phone within reach;with bed alarm set   Nurse Communication Mobility status        Time: 2119-4174 OT Time Calculation (min): 10 min  Charges: OT General Charges $OT Visit: 1 Visit OT Treatments $Self Care/Home Management : 8-22 mins  Dalphine Handing, MSOT, OTR/L Acute Rehabilitation Services [facility] Office Number: [phone] Pager: [phone]  Dalphine Handing 03/20/2020, 4:24 PM

## 2020-03-20 NOTE — Plan of Care (Signed)
  Problem: Education: Goal: Knowledge of General Education information will improve Description Including pain rating scale, medication(s)/side effects and non-pharmacologic comfort measures Outcome: Progressing   Problem: Health Behavior/Discharge Planning: Goal: Ability to manage health-related needs will improve Outcome: Progressing   

## 2020-03-20 NOTE — TOC Transition Note (Signed)
Transition of Care [facility]) - CM/SW Discharge Note   Patient Details  Name: NIAJAH SIPOS MRN: [id number] Date of Birth: [birth year]  Transition of Care [facility]) CM/SW Contact:  Kermit Balo, RN Phone Number: 03/20/2020, 12:31 PM   Clinical Narrative:    Pt discharging home with [facility] services through [facility]. Drew with [facility] is aware of d/c home today.  Pt has transportation home per her spouse.    Final next level of care: Home w Home Health Services Barriers to Discharge: No Barriers Identified   Patient Goals and CMS Choice   CMS Medicare.gov Compare Post Acute Care list provided to:: Patient Choice offered to / list presented to : Patient  Discharge Placement                       Discharge Plan and Services   Discharge Planning Services: CM Consult Post Acute Care Choice: Home Health                    HH Arranged: PT, OT          Social Determinants of Health (SDOH) Interventions     Readmission Risk Interventions No flowsheet data found.

## 2020-03-20 NOTE — Discharge Summary (Signed)
[facility]  Physician Discharge Summary   Patient ID: Beverly Lawrence MRN: [id number] DOB/AGE: 75-Aug-1947 75 y.o.  Admit date: 03/07/2020 Discharge date: 03/20/2020  PCP: Malka So., MD  DISCHARGE DIAGNOSES:  Hyponatremia Ileus, resolved Chronic diarrhea Chronic hypoxic respiratory failure on home oxygen Transient left-sided weakness Abnormal ACTH stimulation test/questionable adrenal insufficiency Chronic diastolic CHF Hypokalemia  RECOMMENDATIONS FOR OUTPATIENT FOLLOW UP: 1. Outpatient follow-up with PCP 2. Blood work to be done at follow-up to check electrolytes and renal function 3. Patient to follow-up with her gastroenterologist in [facility] 4. Consider referral to endocrinology to repeat ACTH stimulation test.    Home Health: PT and OT Equipment/Devices: None  CODE STATUS: Full code  DISCHARGE CONDITION: fair  Diet recommendation: As before  INITIAL HISTORY: Beverly Lawrence is an 74 y.o. female with past medical history significant for PAF, HTN, chronic loose stools at 2-3 per day, SIADH, hemolytic anemia, h/o GI bleed 2 years ago. Patient presented secondary to left sided weakness and admitted for concern for TIA. Patient found to have hypoglycemia in addition to hyponatremia. TIA workup initiated in addition to treatment for hyponatremia (likely hypovolemic on admission) and hypoglycemia.  Stay has been complicated with continued diarrhea.    Consultations:  Neurology  Gastroenterology  Procedures:  None   HOSPITAL COURSE:   Hyponatremia Unsure of etiology; patient has a history of SIADH but also has recent history is of hyponatremia secondary to volume overload requiring Lasix.  Her current episode of hyponatremia thought to be due to hypovolemia.  She was given IV fluids with improvement.   Sodium level has been stable for the last 2 days.  Ileus Patient has a history of multiple abdominal surgeries in addition to previous bowel  obstruction requiring surgical management. CT abdomen/pelvis significant for evidence of ileus. Patient has had colonic distention for years- colonoscopy in 11/2018 ([facility]) Ileus seems to be improving.  Patient has been having bowel movements.  Abdominal distention has improved.    Chronic diarrhea Chronic issue per patient, symptoms are not out of the ordinary- says she take cholestyramine at home TID-- questran BID for now -NO imodium for now secondary to ileus -add iron QOD Rifaximin added by gastroenterology.  2-week course prescribed.  Chronic hypoxic respiratory failure Continue home oxygen  Left sided weakness -resolved -No stroke seen on CT head or MRI. Neurology consulted for evaluation. Possible TIA versus sequela from hypoglycemia or hyponatremia. Neurology recommending to continue Plavix. With patient's history of atrial fibrillation, patient would benefit from anticoagulation however this was previously discontinued secondary to history of autoimmune hemolytic disease in addition to GI bleeding.  Hypoglycemia Hemoglobin A1c obtained and is undetectable at less than 3.5%.  ? related to possible adrenal disease vs liver disease -will need outpatient follow up Glucose levels have stabilized.    Abnormal ACTH stimulation test It does not appear this was performed correctly  -ACTH ordered but not sure of timing: 56 but stim test not able to be done correctly in the hospital -patient clinically appears to be like adrenal insuff so will treat with cortef for now (had been on high dose steroids in past 20 mg TID but per [facility] was no longer taking-- says she was on for 2 years) -clinically she has improved since steroids started Currently on hydrocortisone 10 mg in the morning and 5 mg in the evening.  Continue for now. She will need to follow-up with endocrinology in the outpatient setting.  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  Chronic diastolic heart failure Recent echocardiogram from 9/4 significant for an EF of 60 to 65% with grade 1 diastolic dysfunction.  Seems to be reasonably well compensated  Dysuria Previous urine culture with multiple species. Most recent urinalysis not very remarkable except for small amounts of glucose in addition to small amount of ketones. Repeat urinalysis is unremarkable.  Cellulitis- appears to be chronic venous status changes Present earlier in admission and empirically on Vancomycin with improvement.  She was transitioned to doxycycline and has completed course of the same.  Hypokalemia Potassium level has improved.  Will be discharged on same.  Alcohol use Drinks 3 to 4 glasses of wine daily.  Continue thiamine.  Patient remained stable.  Okay for discharge home today.   PERTINENT LABS:  The results of significant diagnostics from this hospitalization (including imaging, microbiology, ancillary and laboratory) are listed below for reference.    Microbiology: Recent Results (from the past 240 hour(s))  Culture, Urine     Status: Abnormal   Collection Time: 03/10/20  5:28 PM   Specimen: Urine, Clean Catch  Result Value Ref Range Status   Specimen Description URINE, CLEAN CATCH  Final   Special Requests   Final    NONE Performed at [facility], [address] [place], [place] [address]    Culture MULTIPLE SPECIES PRESENT, SUGGEST RECOLLECTION (A)  Final   Report Status 03/11/2020 FINAL  Final     Labs:     Basic Metabolic Panel: Recent Labs  Lab 03/14/20 0132 03/15/20 0246 03/16/20 0652 03/19/20 0157 03/20/20 1006  NA 130*  --  132* 133* 133*  K 3.7  --  3.8 2.9* 3.2*  CL 99  --  99 99 99  CO2 23  --  26 27 24   GLUCOSE 110*  --  78 92 107*  BUN 8  --  6* 9 9  CREATININE 0.43* 0.54 0.52 0.51 0.55  CALCIUM 8.1*  --  8.2* 8.0* 8.2*  MG  --   --   --  1.8 1.7     Liver Function Tests: Recent Labs  Lab 03/14/20 0132  AST 24  ALT 14  ALKPHOS 60  BILITOT 1.3*  PROT 5.2*  ALBUMIN 2.6*   CBC: Recent Labs  Lab 03/14/20 0132 03/16/20 0652 03/19/20 0157  WBC 2.5* 2.4* 3.8*  HGB 8.0* 8.4* 8.7*  HCT 24.8* 25.6* 26.3*  MCV 116.4* 113.3* 111.4*  PLT 142* 163 170   BNP: BNP (last 3 results) Recent Labs    03/08/20 1405  BNP 166.0*     IMAGING STUDIES CT ABDOMEN PELVIS WO CONTRAST  Result Date: 03/10/2020 CLINICAL DATA:  Abdominal pain.  Evaluate for obstruction EXAM: CT ABDOMEN AND PELVIS WITHOUT CONTRAST TECHNIQUE: Multidetector CT imaging of the abdomen and pelvis was performed following the standard protocol without IV contrast. COMPARISON:  Plain film of earlier today. Most recent CT 10/22/2018 from [facility] FINDINGS: Lower chest: Emphysema. A 7 mm right lower lobe pulmonary nodule is similar back to 2017 and considered benign. Left base scarring. Mild cardiomegaly. Multivessel coronary artery atherosclerosis. Hepatobiliary: Normal noncontrast appearance of the liver. Normal gallbladder, without biliary ductal dilatation. Pancreas: Normal, without mass or ductal dilatation. Spleen: Normal in size, without focal abnormality. Adrenals/Urinary Tract: Normal adrenal glands. No renal calculi or hydronephrosis. Upper pole left renal low-density lesion is likely a cyst. There is a lower  pole left renal 1.3 cm lesion which is similar in size on the prior, greater than fluid density on 31/3. An interpolar right renal 1.3 cm lesion is also similar in size to on the prior exam, slightly greater than fluid density and favored to represent a minimally complex cyst. No bladder calculi. Stomach/Bowel: Normal stomach, without wall thickening. Fluid-filled colon, suggesting a diarrheal state. Suspect surgical sutures within the sigmoid. Gas-filled colon of up to 5.6 cm versus 7.3 cm on the prior CT. The cecum extends into the central pelvis. Normal  caliber of small bowel loops. Vascular/Lymphatic: Advanced aortic and branch vessel atherosclerosis. IVC filter. No abdominopelvic adenopathy. Reproductive: Hysterectomy versus uterine atrophy.  No adnexal mass. Other: Moderate pelvic floor laxity. No free pelvic fluid. Trace perihepatic ascites. No free intraperitoneal air. Anasarca. Musculoskeletal: Osteopenia. Convex right lumbar spine curvature. Superior endplate L1 compression deformity without ventral canal encroachment. IMPRESSION: 1. Diffuse colonic distension, suggesting colonic ileus. Fluid-filled colon may represent a concurrent diarrheal state. No findings of obstruction. 2. Coronary artery atherosclerosis. Aortic Atherosclerosis (ICD10-I70.0). 3. Bilateral renal lesions. Technically indeterminate lesions which are unchanged in size, most likely complex cysts. If not already evaluated, these could be characterized with pre and post contrast abdominal CT or MRI at 6-12 months. 4. Trace perihepatic ascites. Electronically Signed   By: Jeronimo Greaves M.D.   On: 03/10/2020 18:52   CT Code Stroke CTA Head W/WO contrast  Result Date: 03/07/2020 CLINICAL DATA:  Left-sided weakness.  Facial droop. EXAM: CT ANGIOGRAPHY HEAD AND NECK TECHNIQUE: Multidetector CT imaging of the head and neck was performed using the standard protocol during bolus administration of intravenous contrast. Multiplanar CT image reconstructions and MIPs were obtained to evaluate the vascular anatomy. Carotid stenosis measurements (when applicable) are obtained utilizing NASCET criteria, using the distal internal carotid diameter as the denominator. CONTRAST:  50mL OMNIPAQUE IOHEXOL 350 MG/ML SOLN COMPARISON:  None. FINDINGS: CTA NECK FINDINGS Aortic arch: Standard 3 vessel aortic arch with mild atherosclerotic plaque. No evidence of a significant arch vessel origin stenosis although note that the brachiocephalic artery origin was incompletely imaged. Right carotid system: Patent with mild  calcified plaque at the carotid bifurcation. No evidence of significant stenosis or dissection. Left carotid system: Patent with moderate calcified plaque about the carotid bifurcation. No evidence of significant stenosis or dissection. Vertebral arteries: Patent and codominant. Calcified plaque at the right vertebral artery origin without evidence of significant stenosis. Skeleton: Focally advanced disc degeneration at C6-7. Other neck: Mild asymmetric enlargement and increased density of the left parotid gland without a discrete mass or surrounding inflammatory change. No evidence of cervical lymphadenopathy. Upper chest: Biapical emphysema, asymmetrically advanced on the right. Review of the MIP images confirms the above findings CTA HEAD FINDINGS Anterior circulation: The internal carotid arteries are patent from skull base to carotid termini with mild atherosclerotic plaque bilaterally not resulting in significant stenosis. ACAs and MCAs are patent with mild branch vessel irregularity but no evidence of a proximal branch occlusion or significant proximal stenosis. A small fenestration is questioned in the left M1 segment, an anatomic variant. No aneurysm is identified. Posterior circulation: The intracranial vertebral arteries are widely patent to the basilar. Patent PICA and SCA origins are seen bilaterally. The basilar artery is widely patent. Posterior communicating arteries are not identified and may be diminutive or absent. Both PCAs are patent without evidence of a significant proximal stenosis. No aneurysm is identified. Venous sinuses: Patent with the left transverse and sigmoid sinuses being hypoplastic. Anatomic variants:  None of significance. Review of the MIP images confirms the above findings IMPRESSION: 1. No large vessel occlusion. 2. Mild atherosclerosis in the head and neck without a significant stenosis. 3. Aortic Atherosclerosis (ICD10-I70.0) and Emphysema (ICD10-J43.9). These results were  communicated to Dr. Amada Jupiter at 10:25 pm on 03/07/2020 by text page via the [facility] messaging system. Electronically Signed   By: Sebastian Ache M.D.   On: 03/07/2020 22:36   DG Abd 1 View  Result Date: 03/12/2020 CLINICAL DATA:  Abdominal pain and distension EXAM: ABDOMEN - 1 VIEW COMPARISON:  March 10, 2020 abdominal radiograph and CT abdomen and pelvis FINDINGS: There remains generalized colonic dilatation, similar in appearance to recent examinations. No appreciable small bowel dilatation. No appreciable air-fluid levels on supine examination. No free air is appreciable on supine examination. Filter noted in inferior vena cava. There is lumbar dextroscoliosis. IMPRESSION: Colonic dilatation remains, similar in appearance compared to recent studies. No site of focal bowel obstruction evident by radiography. No appreciable free air on supine examination. Filter in inferior vena cava. Electronically Signed   By: Bretta Bang III M.D.   On: 03/12/2020 13:22   CT Code Stroke CTA Neck W/WO contrast  Result Date: 03/07/2020 CLINICAL DATA:  Left-sided weakness.  Facial droop. EXAM: CT ANGIOGRAPHY HEAD AND NECK TECHNIQUE: Multidetector CT imaging of the head and neck was performed using the standard protocol during bolus administration of intravenous contrast. Multiplanar CT image reconstructions and MIPs were obtained to evaluate the vascular anatomy. Carotid stenosis measurements (when applicable) are obtained utilizing NASCET criteria, using the distal internal carotid diameter as the denominator. CONTRAST:  50mL OMNIPAQUE IOHEXOL 350 MG/ML SOLN COMPARISON:  None. FINDINGS: CTA NECK FINDINGS Aortic arch: Standard 3 vessel aortic arch with mild atherosclerotic plaque. No evidence of a significant arch vessel origin stenosis although note that the brachiocephalic artery origin was incompletely imaged. Right carotid system: Patent with mild calcified plaque at the carotid bifurcation. No evidence of  significant stenosis or dissection. Left carotid system: Patent with moderate calcified plaque about the carotid bifurcation. No evidence of significant stenosis or dissection. Vertebral arteries: Patent and codominant. Calcified plaque at the right vertebral artery origin without evidence of significant stenosis. Skeleton: Focally advanced disc degeneration at C6-7. Other neck: Mild asymmetric enlargement and increased density of the left parotid gland without a discrete mass or surrounding inflammatory change. No evidence of cervical lymphadenopathy. Upper chest: Biapical emphysema, asymmetrically advanced on the right. Review of the MIP images confirms the above findings CTA HEAD FINDINGS Anterior circulation: The internal carotid arteries are patent from skull base to carotid termini with mild atherosclerotic plaque bilaterally not resulting in significant stenosis. ACAs and MCAs are patent with mild branch vessel irregularity but no evidence of a proximal branch occlusion or significant proximal stenosis. A small fenestration is questioned in the left M1 segment, an anatomic variant. No aneurysm is identified. Posterior circulation: The intracranial vertebral arteries are widely patent to the basilar. Patent PICA and SCA origins are seen bilaterally. The basilar artery is widely patent. Posterior communicating arteries are not identified and may be diminutive or absent. Both PCAs are patent without evidence of a significant proximal stenosis. No aneurysm is identified. Venous sinuses: Patent with the left transverse and sigmoid sinuses being hypoplastic. Anatomic variants: None of significance. Review of the MIP images confirms the above findings IMPRESSION: 1. No large vessel occlusion. 2. Mild atherosclerosis in the head and neck without a significant stenosis. 3. Aortic Atherosclerosis (ICD10-I70.0) and Emphysema (ICD10-J43.9). These results  were communicated to Dr. Amada Jupiter at 10:25 pm on 03/07/2020 by  text page via the [facility] messaging system. Electronically Signed   By: Sebastian Ache M.D.   On: 03/07/2020 22:36   MR BRAIN WO CONTRAST  Result Date: 03/08/2020 CLINICAL DATA:  Transient ischemic attack EXAM: MRI HEAD WITHOUT CONTRAST TECHNIQUE: Multiplanar, multiecho pulse sequences of the brain and surrounding structures were obtained without intravenous contrast. COMPARISON:  None. FINDINGS: Brain: No acute infarct, acute hemorrhage or extra-axial collection. Multifocal hyperintense T2-weighted signal within the white matter. There is generalized atrophy without lobar predilection. No chronic microhemorrhage. Normal midline structures. Vascular: Normal flow voids. Skull and upper cervical spine: Normal marrow signal. Sinuses/Orbits: Negative. Postsurgical changes of the maxillary sinuses and nasal septum. Other: None. IMPRESSION: 1. No acute intracranial abnormality. 2. Generalized atrophy and findings of chronic microvascular disease. Electronically Signed   By: Deatra Robinson M.D.   On: 03/08/2020 03:06   DG Abd 2 Views  Result Date: 03/10/2020 CLINICAL DATA:  Weakness.  Hyperglycemia. EXAM: ABDOMEN - 2 VIEW COMPARISON:  December 29, 2017 FINDINGS: Distended bowel loops likely represent distended colon with maximum diameter of 7.7 cm. No definite small bowel obstruction. IVC filter place. Calcific atherosclerotic disease and tortuosity of the aorta. IMPRESSION: 1. Distended bowel loops likely represent distended colon with maximum diameter of 7.7 cm. Findings may represent colonic ileus or early colonic obstruction. 2. Calcific atherosclerotic disease of the aorta. Electronically Signed   By: Ted Mcalpine M.D.   On: 03/10/2020 13:40   DG Abd Portable 2V  Result Date: 03/15/2020 CLINICAL DATA:  Follow-up ileus. EXAM: X-RAY ABDOMEN 3 VIEWS COMPARISON:  March 12, 2020 FINDINGS: Diffuse generalized colonic dilatation is again seen. This is unchanged in appearance when compared to the prior study the  visualized small bowel loops appear to be normal in caliber. There is no evidence of free air. An inferior vena cava filter is noted. No radio-opaque calculi or other significant radiographic abnormality is seen. There is marked severity dextroscoliosis of the lumbar spine. IMPRESSION: Findings suggestive of colonic ileus, unchanged in appearance when compared to the prior study. Electronically Signed   By: Aram Candela M.D.   On: 03/15/2020 19:37   EEG adult  Result Date: 03/08/2020 Charlsie Quest, MD     03/08/2020  6:23 PM Patient Name: KHADISHA CROSSLIN MRN: [id number] Epilepsy Attending: Charlsie Quest Referring Physician/Provider: Dr Marvel Plan Date: 03/08/2020 Duration: 25.03 mins Patient history: 74yo F with transient left sided weakness and numbness. EEG to evaluate for seizure. Level of alertness: Awake, asleep AEDs during EEG study: None Technical aspects: This EEG study was done with scalp electrodes positioned according to the 10-20 International system of electrode placement. Electrical activity was acquired at a sampling rate of 500Hz  and reviewed with a high frequency filter of 70Hz  and a low frequency filter of 1Hz . EEG data were recorded continuously and digitally stored. Description: The posterior dominant rhythm consists of 8 Hz activity of moderate voltage (25-35 uV) seen predominantly in posterior head regions, symmetric and reactive to eye opening and eye closing. Sleep was characterized by vertex waves, sleep spindles (12 to 14 Hz), maximal frontocentral region. Hyperventilation and photic stimulation were not performed.   IMPRESSION: This study is within normal limits. No seizures or epileptiform discharges were seen throughout the recording. Charlsie Quest   ECHOCARDIOGRAM COMPLETE  Result Date: 03/08/2020    ECHOCARDIOGRAM REPORT   Patient Name:   Caprice LARSEN ARAMBURU Date of Exam: 03/08/2020 Medical  Rec #:  [id number]     Height:       63.0 in Accession #:    [id number]    Weight:        126.8 lb Date of Birth:  08-Jun-1946     BSA:          1.593 m Patient Age:    74 years      BP:           97/86 mmHg Patient Gender: F             HR:           73 bpm. Exam Location:  Inpatient Procedure: 2D Echo Indications:    TIA 435.9  History:        Patient has no prior history of Echocardiogram examinations.                 Arrythmias:Paroxymal afib; Risk Factors:Hypertension.  Sonographer:    Delcie Roch Referring Phys: [id number] JINDONG XU IMPRESSIONS  1. Left ventricular ejection fraction, by estimation, is 60 to 65%. The left ventricle has normal function. The left ventricle has no regional wall motion abnormalities. There is mild left ventricular hypertrophy. Left ventricular diastolic parameters are consistent with Grade I diastolic dysfunction (impaired relaxation).  2. Right ventricular systolic function is moderately reduced. The right ventricular size is mildly enlarged. There is mildly elevated pulmonary artery systolic pressure. The estimated right ventricular systolic pressure is 40.9 mmHg.  3. Left atrial size was mildly dilated.  4. The mitral valve is normal in structure. No evidence of mitral valve regurgitation.  5. The aortic valve is tricuspid. Aortic valve regurgitation is mild. No aortic stenosis is present.  6. The inferior vena cava is normal in size with greater than 50% respiratory variability, suggesting right atrial pressure of 3 mmHg. FINDINGS  Left Ventricle: Left ventricular ejection fraction, by estimation, is 60 to 65%. The left ventricle has normal function. The left ventricle has no regional wall motion abnormalities. The left ventricular internal cavity size was normal in size. There is  mild left ventricular hypertrophy. Left ventricular diastolic parameters are consistent with Grade I diastolic dysfunction (impaired relaxation). Right Ventricle: The right ventricular size is mildly enlarged. Right vetricular wall thickness was not assessed. Right ventricular systolic  function is moderately reduced. There is mildly elevated pulmonary artery systolic pressure. The tricuspid regurgitant velocity is 3.08 m/s, and with an assumed right atrial pressure of 3 mmHg, the estimated right ventricular systolic pressure is 40.9 mmHg. Left Atrium: Left atrial size was mildly dilated. Right Atrium: Right atrial size was normal in size. Pericardium: There is no evidence of pericardial effusion. Mitral Valve: The mitral valve is normal in structure. No evidence of mitral valve regurgitation. Tricuspid Valve: The tricuspid valve is normal in structure. Tricuspid valve regurgitation is mild. Aortic Valve: The aortic valve is tricuspid. Aortic valve regurgitation is mild. Aortic regurgitation PHT measures 535 msec. No aortic stenosis is present. Aortic valve mean gradient measures 8.0 mmHg. Aortic valve peak gradient measures 17.8 mmHg. Aortic valve area, by VTI measures 2.00 cm. Pulmonic Valve: The pulmonic valve was not well visualized. Pulmonic valve regurgitation is not visualized. Aorta: The aortic root and ascending aorta are structurally normal, with no evidence of dilitation. Venous: The inferior vena cava is normal in size with greater than 50% respiratory variability, suggesting right atrial pressure of 3 mmHg. IAS/Shunts: The interatrial septum was not well visualized.  LEFT VENTRICLE PLAX 2D LVIDd:  4.30 cm  Diastology LVIDs:         2.40 cm  LV e' lateral:   6.53 cm/s LV PW:         1.10 cm  LV E/e' lateral: 8.5 LV IVS:        0.90 cm  LV e' medial:    6.64 cm/s LVOT diam:     2.20 cm  LV E/e' medial:  8.4 LV SV:         70 LV SV Index:   44 LVOT Area:     3.80 cm  RIGHT VENTRICLE             IVC RV S prime:     20.50 cm/s  IVC diam: 1.30 cm LEFT ATRIUM             Index       RIGHT ATRIUM           Index LA diam:        4.50 cm 2.82 cm/m  RA Area:     14.70 cm LA Vol (A2C):   67.9 ml 42.62 ml/m RA Volume:   29.10 ml  18.27 ml/m LA Vol (A4C):   49.6 ml 31.14 ml/m LA  Biplane Vol: 60.0 ml 37.67 ml/m  AORTIC VALVE AV Area (Vmax):    1.70 cm AV Area (Vmean):   1.67 cm AV Area (VTI):     2.00 cm AV Vmax:           211.00 cm/s AV Vmean:          125.000 cm/s AV VTI:            0.347 m AV Peak Grad:      17.8 mmHg AV Mean Grad:      8.0 mmHg LVOT Vmax:         94.60 cm/s LVOT Vmean:        54.800 cm/s LVOT VTI:          0.183 m LVOT/AV VTI ratio: 0.53 AI PHT:            535 msec  AORTA Ao Root diam: 3.30 cm Ao Asc diam:  3.00 cm MITRAL VALVE               TRICUSPID VALVE MV Area (PHT): 2.62 cm    TR Peak grad:   37.9 mmHg MV Decel Time: 289 msec    TR Vmax:        308.00 cm/s MV E velocity: 55.70 cm/s MV A velocity: 74.60 cm/s  SHUNTS MV E/A ratio:  0.75        Systemic VTI:  0.18 m                            Systemic Diam: 2.20 cm Epifanio Lesches MD Electronically signed by Epifanio Lesches MD Signature Date/Time: 03/08/2020/2:06:31 PM    Final    CT HEAD CODE STROKE WO CONTRAST  Result Date: 03/07/2020 CLINICAL DATA:  Code stroke.  Left-sided weakness.  Facial droop. EXAM: CT HEAD WITHOUT CONTRAST TECHNIQUE: Contiguous axial images were obtained from the base of the skull through the vertex without intravenous contrast. COMPARISON:  Head CT 01/11/2018 and MRI 08/20/2014 FINDINGS: Brain: There is no evidence of an acute infarct, intracranial hemorrhage, mass, midline shift, or extra-axial fluid collection. Mild cerebral atrophy is within normal limits for age. Hypodensities in the cerebral white matter bilaterally are nonspecific but compatible  with mild chronic small vessel ischemic disease, stable to slightly progressed. 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 Vascular: Calcified atherosclerosis at the skull base. No hyperdense vessel. Skull: No fracture or suspicious osseous lesion. Sinuses/Orbits: Prior endoscopic sinus surgery. Extensive chronic sinusitis with circumferential mucosal thickening in the maxillary sinuses, near complete opacification  of the sphenoid sinuses, complete opacification of the right frontal sinus, and moderate bilateral ethmoid sinus mucosal thickening. 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 Small chronic effusion at the left mastoid tip. Bilateral cataract extraction. Other: None. ASPECTS [facility] Early CT Score) - Ganglionic level infarction (caudate, lentiform nuclei, internal capsule, insula, M1-M3 cortex): 7 - Supraganglionic infarction (M4-M6 cortex): 3 Total score (0-10 with 10 being normal): 10 IMPRESSION: 1. No evidence of acute intracranial abnormality. 2. ASPECTS is 10. 3. Mild chronic small vessel ischemic disease. These results were communicated to Dr. Amada Jupiter at 10:05 pm on 03/07/2020 by text page via the [facility] messaging system. Electronically Signed   By: Sebastian Ache M.D.   On: 03/07/2020 22:06    DISCHARGE EXAMINATION: Vitals:   03/20/20 0409 03/20/20 0415 03/20/20 0958 03/20/20 1054  BP: (!) 149/70 118/66 120/72 (!) 142/78  Pulse: 62 71 77 76  Resp: 15  (!) 24 18  Temp: 97.8 F (36.6 C) 98.9 F (37.2 C) 98.4 F (36.9 C) 98.1 F (36.7 C)  TempSrc: Oral Axillary Oral Oral  SpO2: 100% 100% 100% 99%  Weight:  65.3 kg    Height:       General appearance: Awake alert.  In no distress Resp: Clear to auscultation bilaterally.  Normal effort Cardio: S1-S2 is normal regular.  No S3-S4.  No rubs murmurs or bruit GI: Abdomen is soft.  Nontender nondistended.  Bowel sounds are present normal.  No masses organomegaly    DISPOSITION: Home with home health  Discharge Instructions    Ambulatory referral to Neurology   Complete by: As directed    Follow up with stroke clinic NP (Jessica Vanschaick or Darrol Angel, if both not available, consider Manson Allan, or [facility]) at [facility] in about 4 weeks. Thanks.   Call MD for:  difficulty breathing, headache or visual disturbances   Complete by: As directed    Call MD for:  extreme fatigue   Complete by: As  directed    Call MD for:  persistant dizziness or light-headedness   Complete by: As directed    Call MD for:  persistant nausea and vomiting   Complete by: As directed    Call MD for:  severe uncontrolled pain   Complete by: As directed    Call MD for:  temperature >100.4   Complete by: As directed    Diet - low sodium heart healthy   Complete by: As directed    Discharge instructions   Complete by: As directed    Please be sure to follow-up with your primary care provider in 1 to 2 weeks and with your gastroenterologist in the same timeframe.  Take your medications as prescribed.  Home health has been ordered.  You were cared for by a hospitalist during your hospital stay. If you have any questions about your discharge medications or the care you received while you were in the hospital after you are discharged, you can call the unit and asked to speak with the hospitalist on call if the hospitalist that took care of you is not available. Once you are discharged, your primary care  physician will handle any further medical issues. Please note that NO REFILLS for any discharge medications will be authorized once you are discharged, as it is imperative that you return to your primary care physician (or establish a relationship with a primary care physician if you do not have one) for your aftercare needs so that they can reassess your need for medications and monitor your lab values. If you do not have a primary care physician, you can call [phone] for a physician referral.   Increase activity slowly   Complete by: As directed         Allergies as of 03/20/2020      Reactions   Lisinopril Swelling   Facial swelling   Codeine Nausea And Vomiting   Morphine And Related Nausea And Vomiting   Percocet [oxycodone-acetaminophen] Nausea And Vomiting   Percodan [oxycodone-aspirin] Nausea And Vomiting   Alendronate Other (See Comments)   Trouble swallowing   Hydrochlorothiazide Other (See  Comments)   Hyponatremia. - reported by [facility] 07/03/2017   Erythromycin Other (See Comments)   Unknown reaction   Penicillins Hives, Other (See Comments)   Has patient had a PCN reaction causing immediate rash, facial/tongue/throat swelling, SOB or lightheadedness with hypotension: No Has patient had a PCN reaction causing severe rash involving mucus membranes or skin necrosis: No Has patient had a PCN reaction that required hospitalization No Has patient had a PCN reaction occurring within the last 10 years: No If all of the above answers are "NO", then may proceed with Cephalosporin use.      Medication List    STOP taking these medications   amLODipine 10 MG tablet Commonly known as: NORVASC   diltiazem 180 MG 24 hr tablet Commonly known as: CARDIZEM LA   Eliquis 5 MG Tabs tablet Generic drug: apixaban   labetalol 100 MG tablet Commonly known as: NORMODYNE   oxybutynin 5 MG 24 hr tablet Commonly known as: DITROPAN-XL   pantoprazole 40 MG tablet Commonly known as: PROTONIX   Potassium Chloride ER 20 MEQ Tbcr   POTASSIUM PO   predniSONE 10 MG tablet Commonly known as: DELTASONE     TAKE these medications   acetaminophen 500 MG tablet Commonly known as: TYLENOL Take 1,000 mg by mouth 4 (four) times daily.   acidophilus Caps capsule Take 1 capsule by mouth daily. Start taking on: March 21, 2020   cholestyramine light 4 g packet Commonly known as: PREVALITE Take 1 packet (4 g total) by mouth 2 (two) times daily.   clopidogrel 75 MG tablet Commonly known as: PLAVIX Take 1 tablet (75 mg total) by mouth daily. Start taking on: March 21, 2020   diltiazem 180 MG 24 hr capsule Commonly known as: DILACOR XR Take 180 mg by mouth daily.   folic acid 1 MG tablet Commonly known as: FOLVITE Take 1 mg by mouth daily.   furosemide 20 MG tablet Commonly known as: LASIX Take 20 mg by mouth daily.   hydrocortisone 5 MG tablet Commonly known as:  CORTEF Take 2 tablets in Am and 1 tablet in PM   iron polysaccharides 150 MG capsule Commonly known as: NIFEREX Take 1 capsule (150 mg total) by mouth every other day. Start taking on: March 21, 2020   metoprolol tartrate 25 MG tablet Commonly known as: LOPRESSOR Take 12.5 mg by mouth 2 (two) times daily.   ondansetron 4 MG disintegrating tablet Commonly known as: ZOFRAN-ODT Take 1 tablet (4 mg total) by mouth every 6 (six)  hours as needed for nausea, vomiting or refractory nausea / vomiting.   OXYGEN Inhale 2 L into the lungs continuous.   potassium chloride SA 20 MEQ tablet Commonly known as: KLOR-CON Take 1 tablet (20 mEq total) by mouth daily.   rifaximin 550 MG Tabs tablet Commonly known as: XIFAXAN Take 1 tablet (550 mg total) by mouth 3 (three) times daily for 14 days.   thiamine 100 MG tablet Take 1 tablet (100 mg total) by mouth daily. Start taking on: March 21, 2020   VITAMIN B-12 PO Take 1 tablet by mouth daily.   zolpidem 5 MG tablet Commonly known as: AMBIEN Take 5 mg by mouth at bedtime as needed for sleep.         Follow-up Information    [facility]. Call.   Why: Please call for covid vaccine appointment. You may also make an appointment at your pharmacy.  This website ( [email or web address] ) allows you to enter your zip code to find a list of local providers. Many pharmacies are listed.  Contact information:  [phone]       [facility]. Schedule an appointment as soon as possible for a visit in 4 week(s).   Specialty: Neurology Contact information: [address] [place] [address] [phone]       Clovis Pu, PA-C. Schedule an appointment as soon as possible for a visit in 1 week(s).   Specialty: Gastroenterology Contact information: [address] [place] [address] [phone]        [facility] Follow up.    Specialty: Home Health Services Why: The home health agency will contact you for the first appointment Contact information: [address] [place] [address] [phone]               TOTAL DISCHARGE TIME: 35 mins  Fadia Marlar Rito Ehrlich  [facility] Pager on [email or web address]  03/20/2020, 1:14 PM

## 2020-03-20 NOTE — Progress Notes (Signed)
[facility] ROUNDING NOTE   Subjective:  Feels much better, decreased distention.  She did not have any bowel movement overnight. She continues to have loose to semiformed bowel movements but significantly decreased frequency only 3 in the past 24 hours   Objective: Vital signs in last 24 hours: Temp:  [97.8 F (36.6 C)-98.9 F (37.2 C)] 98.1 F (36.7 C) (09/16 1054) Pulse Rate:  [62-77] 76 (09/16 1054) Resp:  [15-24] 18 (09/16 1054) BP: (106-149)/(65-78) 142/78 (09/16 1054) SpO2:  [99 %-100 %] 99 % (09/16 1054) Weight:  [65.3 kg] 65.3 kg (09/16 0415) Last BM Date: 03/19/20 General: NAD Abdomen: Soft, nontender, mild distention Ext: No edema    Intake/Output from previous day: 09/15 0701 - 09/16 0700 In: 480 [P.O.:480] Out: -  Intake/Output this shift: No intake/output data recorded.   Lab Results: Recent Labs    03/19/20 0157  WBC 3.8*  HGB 8.7*  PLT 170  MCV 111.4*   BMET Recent Labs    03/19/20 0157 03/20/20 1006  NA 133* 133*  K 2.9* 3.2*  CL 99 99  CO2 27 24  GLUCOSE 92 107*  BUN 9 9  CREATININE 0.51 0.55  CALCIUM 8.0* 8.2*   LFT No results for input(s): PROT, ALBUMIN, AST, ALT, ALKPHOS, BILITOT, BILIDIR, IBILI in the last 72 hours. PT/INR No results for input(s): INR in the last 72 hours.    Imaging/Other results: No results found.    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 The patient was provided an opportunity to ask questions and all were answered. The patient agreed with the plan and demonstrated an understanding of the instructions.  Iona Beard , MD [phone]  [facility] Gastroenterology

## 2020-03-20 NOTE — Care Management Important Message (Signed)
Important Message  Patient Details  Name: Beverly Lawrence MRN: [id number] Date of Birth: 02-18-1946   Medicare Important Message Given:  Yes - Important Message mailed due to current National Emergency  Verbal consent obtained due to current National Emergency  Relationship to patient: Self Contact Name: Niema Carrara Call Date: 03/20/20  Time: 1301 Phone: [phone] Outcome: Spoke with contact Important Message mailed to: Patient address on file    Oralia Rud Jas Betten 03/20/2020, 1:01 PM

## 2020-03-20 NOTE — Progress Notes (Signed)
Discharged to home after discharge instructions reviewed with pt and spouse.  All questions answered.

## 2022-03-05 DEATH — deceased

## 2022-03-23 IMAGING — DX DG ABDOMEN 2V
2 series · 2 of 2 positions shown · non-contrast
Comparison: December 29, 2017

CLINICAL DATA: Weakness.  Hyperglycemia.

EXAM:
ABDOMEN - 2 VIEW

[abdomen erect]
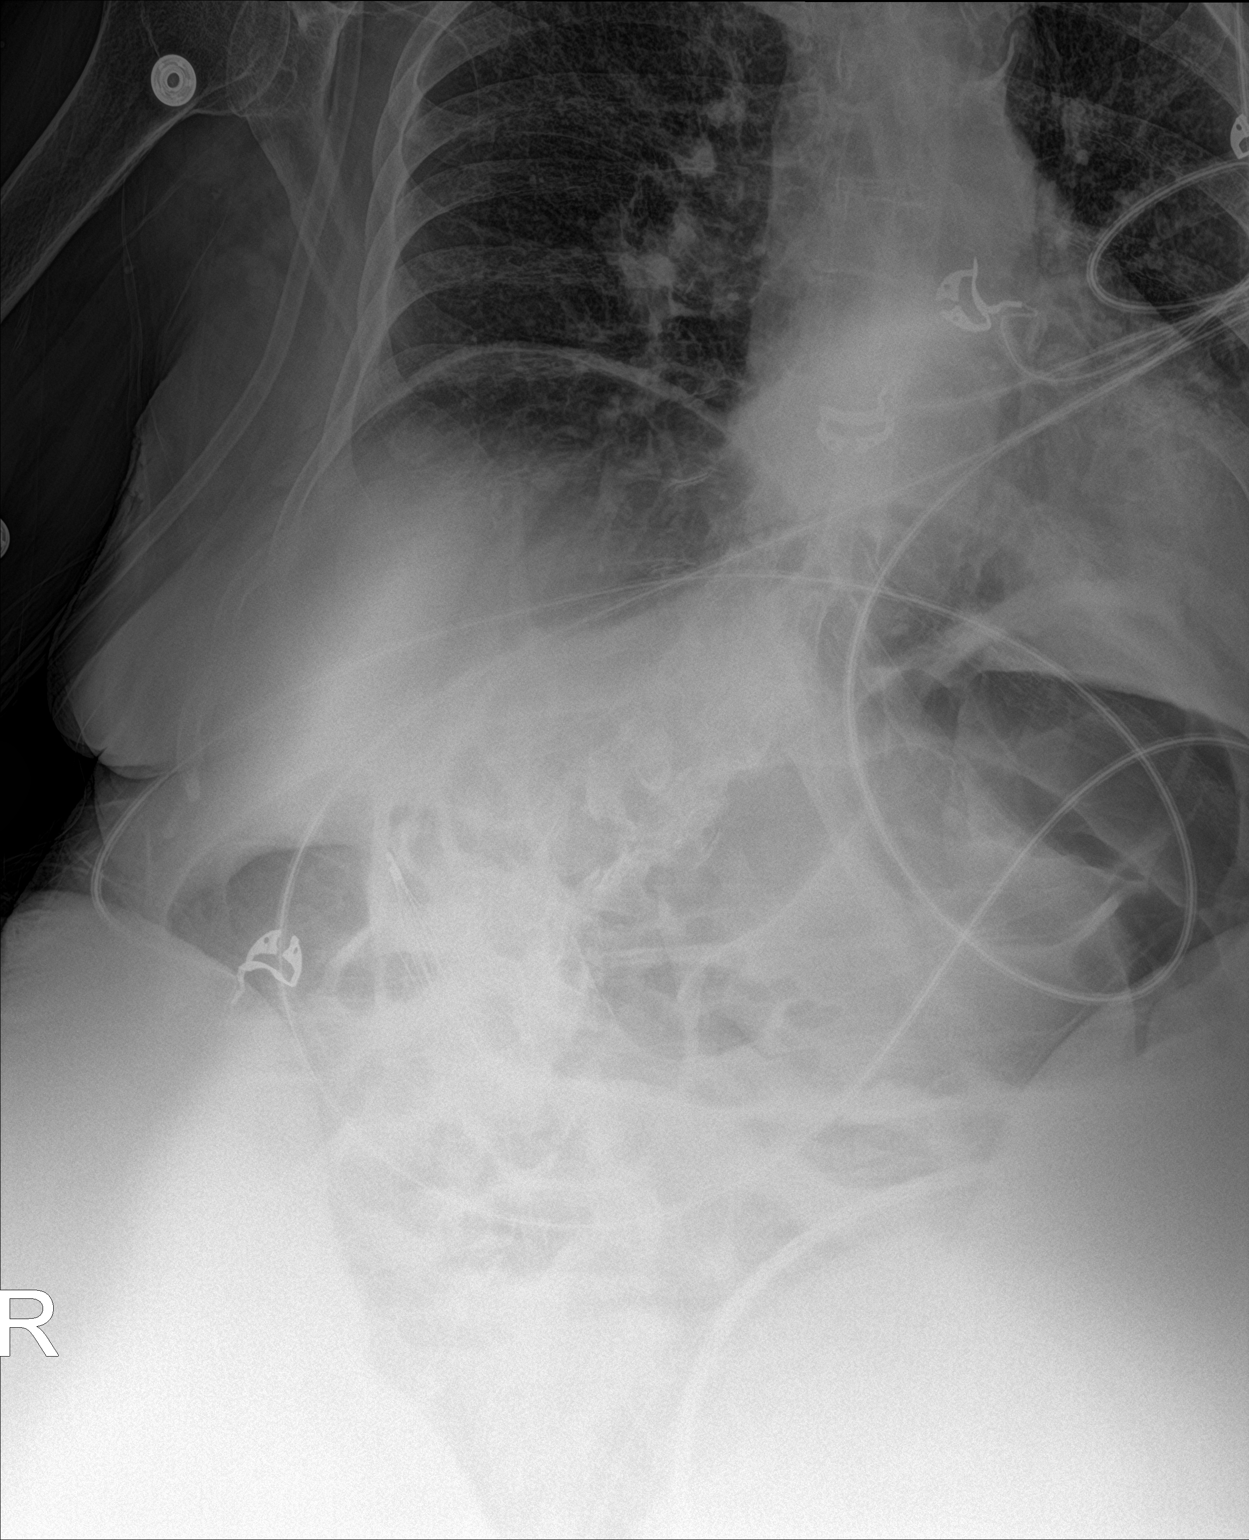

[abdomen supine]
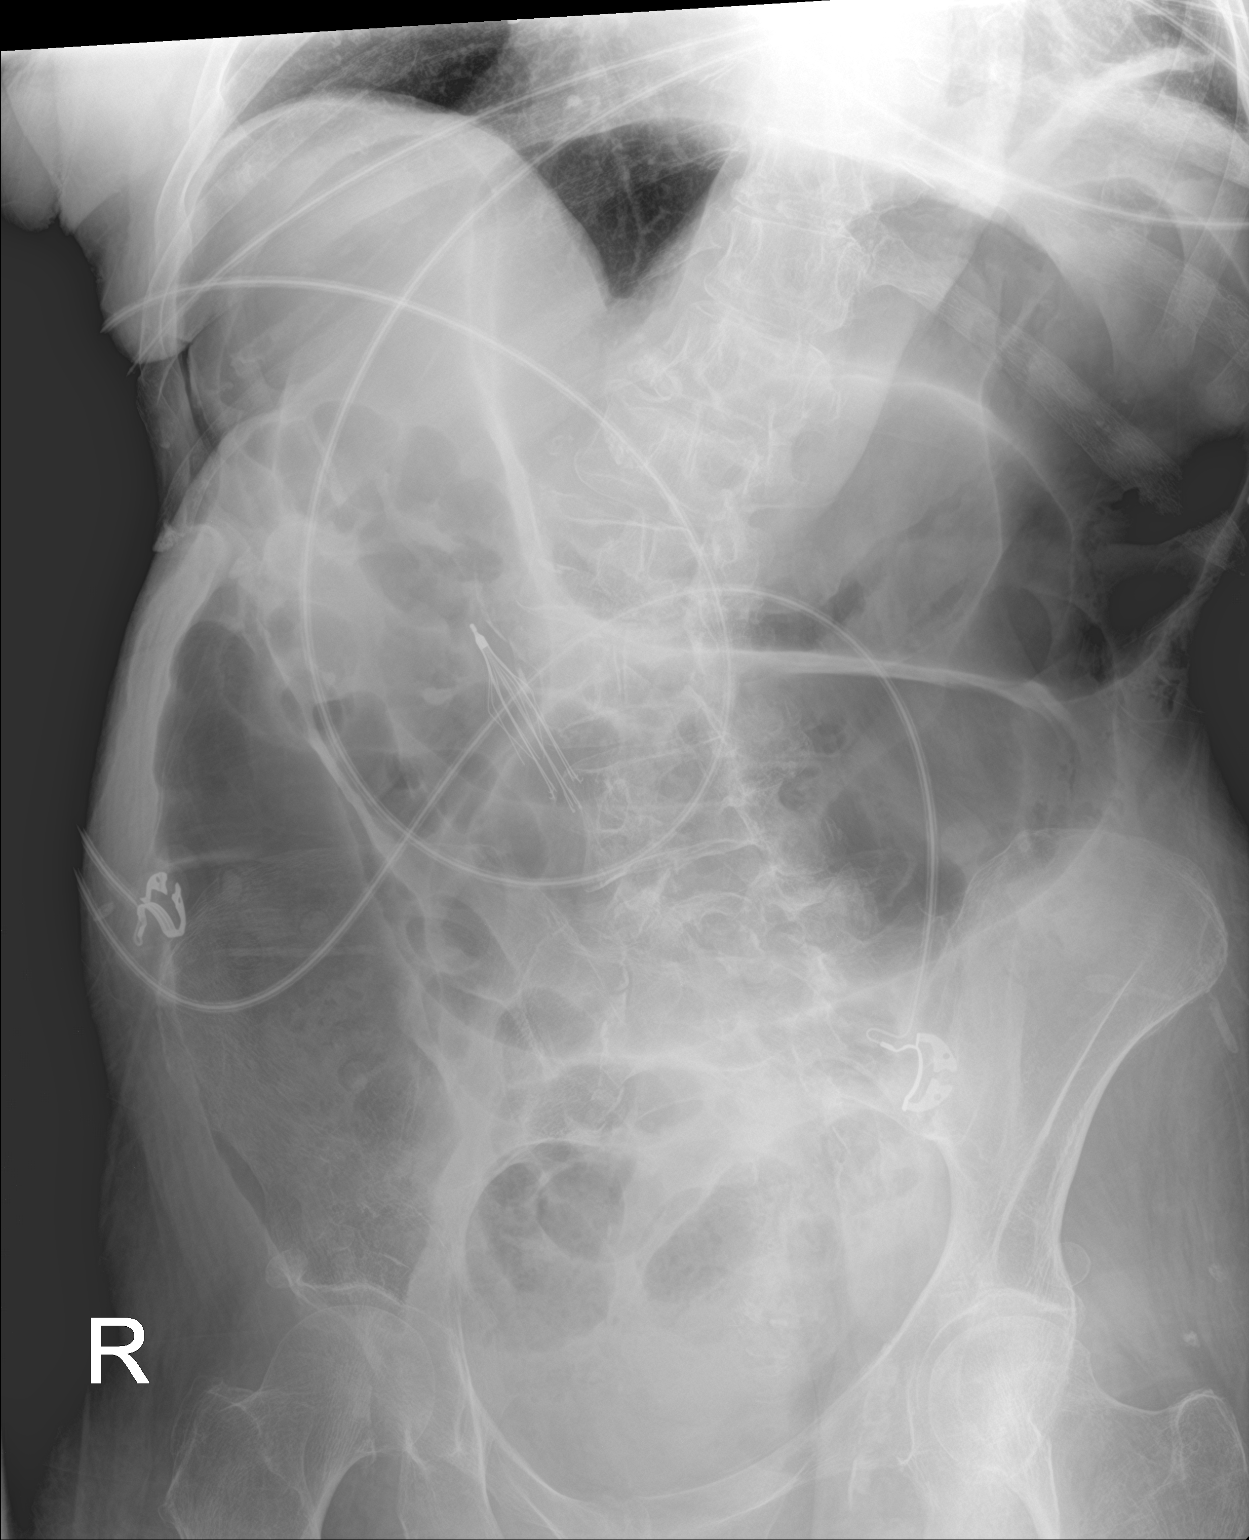

[2 of 2 positions shown; findings below may reference images not displayed]

FINDINGS: Distended bowel loops likely represent distended colon with maximum
diameter of 7.7 cm. No definite small bowel obstruction.

IVC filter place. Calcific atherosclerotic disease and tortuosity of
the aorta.
IMPRESSION: 1. Distended bowel loops likely represent distended colon with
maximum diameter of 7.7 cm. Findings may represent colonic ileus or
early colonic obstruction.
2. Calcific atherosclerotic disease of the aorta.
